# Patient Record
Sex: Female | Born: 1985
Health system: Southern US, Community
[De-identification: ages and names within clinical notes are randomized; demographics above are authoritative.]

## PROBLEM LIST (undated history)

## (undated) ENCOUNTER — Inpatient Hospital Stay (HOSPITAL_COMMUNITY): Payer: Self-pay

## (undated) ENCOUNTER — Ambulatory Visit (HOSPITAL_COMMUNITY): Admission: EM | Source: Home / Self Care

## (undated) DIAGNOSIS — I10 Essential (primary) hypertension: Secondary | ICD-10-CM

## (undated) DIAGNOSIS — O139 Gestational [pregnancy-induced] hypertension without significant proteinuria, unspecified trimester: Secondary | ICD-10-CM

## (undated) DIAGNOSIS — N760 Acute vaginitis: Secondary | ICD-10-CM

## (undated) DIAGNOSIS — N83209 Unspecified ovarian cyst, unspecified side: Secondary | ICD-10-CM

## (undated) DIAGNOSIS — B9689 Other specified bacterial agents as the cause of diseases classified elsewhere: Secondary | ICD-10-CM

## (undated) HISTORY — DX: Unspecified ovarian cyst, unspecified side: N83.209

---
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
## 2007-07-27 ENCOUNTER — Emergency Department (HOSPITAL_COMMUNITY): Admission: EM | Admit: 2007-07-27 | Discharge: 2007-07-27 | Payer: Self-pay | Admitting: Emergency Medicine

## 2007-07-28 ENCOUNTER — Inpatient Hospital Stay (HOSPITAL_COMMUNITY): Admission: AD | Admit: 2007-07-28 | Discharge: 2007-07-28 | Payer: Self-pay | Admitting: Obstetrics & Gynecology

## 2007-08-19 ENCOUNTER — Ambulatory Visit (HOSPITAL_COMMUNITY): Admission: RE | Admit: 2007-08-19 | Discharge: 2007-08-19 | Payer: Self-pay | Admitting: Family Medicine

## 2007-08-21 ENCOUNTER — Ambulatory Visit (HOSPITAL_COMMUNITY): Admission: RE | Admit: 2007-08-21 | Discharge: 2007-08-21 | Payer: Self-pay | Admitting: Obstetrics & Gynecology

## 2007-09-14 ENCOUNTER — Ambulatory Visit: Payer: Self-pay | Admitting: Obstetrics and Gynecology

## 2007-09-14 ENCOUNTER — Inpatient Hospital Stay (HOSPITAL_COMMUNITY): Admission: AD | Admit: 2007-09-14 | Discharge: 2007-09-16 | Payer: Self-pay | Admitting: Obstetrics & Gynecology

## 2007-09-21 ENCOUNTER — Ambulatory Visit (HOSPITAL_COMMUNITY): Admission: RE | Admit: 2007-09-21 | Discharge: 2007-09-21 | Payer: Self-pay | Admitting: Obstetrics & Gynecology

## 2007-09-28 ENCOUNTER — Ambulatory Visit (HOSPITAL_COMMUNITY): Admission: RE | Admit: 2007-09-28 | Discharge: 2007-09-28 | Payer: Self-pay | Admitting: Obstetrics & Gynecology

## 2007-10-01 ENCOUNTER — Ambulatory Visit: Payer: Self-pay | Admitting: Family Medicine

## 2007-10-05 ENCOUNTER — Ambulatory Visit (HOSPITAL_COMMUNITY): Admission: RE | Admit: 2007-10-05 | Discharge: 2007-10-05 | Payer: Self-pay | Admitting: Obstetrics & Gynecology

## 2007-10-08 ENCOUNTER — Ambulatory Visit: Payer: Self-pay | Admitting: *Deleted

## 2007-10-08 ENCOUNTER — Other Ambulatory Visit: Payer: Self-pay | Admitting: Obstetrics & Gynecology

## 2007-10-13 ENCOUNTER — Ambulatory Visit (HOSPITAL_COMMUNITY): Admission: RE | Admit: 2007-10-13 | Discharge: 2007-10-13 | Payer: Self-pay | Admitting: Obstetrics & Gynecology

## 2007-11-16 ENCOUNTER — Ambulatory Visit (HOSPITAL_COMMUNITY): Admission: RE | Admit: 2007-11-16 | Discharge: 2007-11-16 | Payer: Self-pay | Admitting: Obstetrics & Gynecology

## 2007-11-23 ENCOUNTER — Ambulatory Visit (HOSPITAL_COMMUNITY): Admission: RE | Admit: 2007-11-23 | Discharge: 2007-11-23 | Payer: Self-pay | Admitting: Obstetrics & Gynecology

## 2007-11-30 ENCOUNTER — Ambulatory Visit (HOSPITAL_COMMUNITY): Admission: RE | Admit: 2007-11-30 | Discharge: 2007-11-30 | Payer: Self-pay | Admitting: Obstetrics & Gynecology

## 2007-12-08 ENCOUNTER — Encounter: Payer: Self-pay | Admitting: *Deleted

## 2007-12-08 ENCOUNTER — Inpatient Hospital Stay (HOSPITAL_COMMUNITY): Admission: AD | Admit: 2007-12-08 | Discharge: 2007-12-08 | Payer: Self-pay | Admitting: Obstetrics and Gynecology

## 2007-12-10 ENCOUNTER — Ambulatory Visit: Payer: Self-pay | Admitting: Obstetrics & Gynecology

## 2007-12-10 ENCOUNTER — Inpatient Hospital Stay (HOSPITAL_COMMUNITY): Admission: AD | Admit: 2007-12-10 | Discharge: 2007-12-10 | Payer: Self-pay | Admitting: Obstetrics & Gynecology

## 2009-02-20 IMAGING — US US OB TRANSVAGINAL
1 series · 14 of 28 positions shown · non-contrast
Comparison: none

OBSTETRICAL ULTRASOUND:
 This ultrasound was performed in The [HOSPITAL], and the [facility] report will be stored to [REDACTED] PACS.

[Series 1: us ob transvaginal · 14 of 45 slices shown]
[im 2/45]
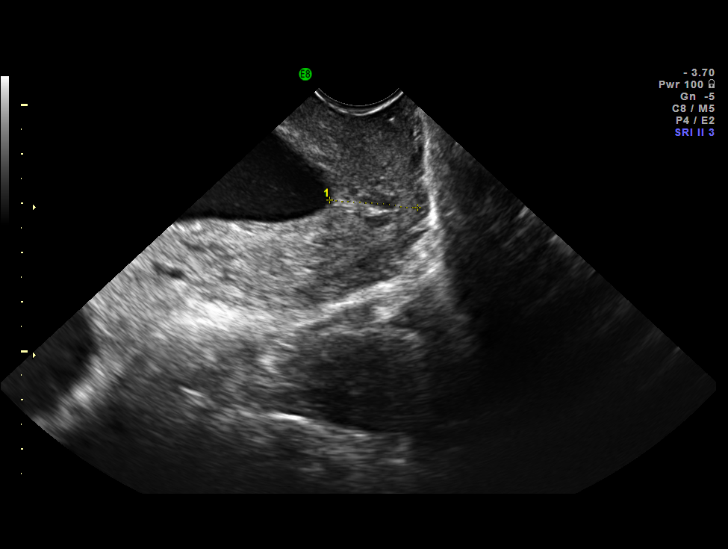
[im 5/45]
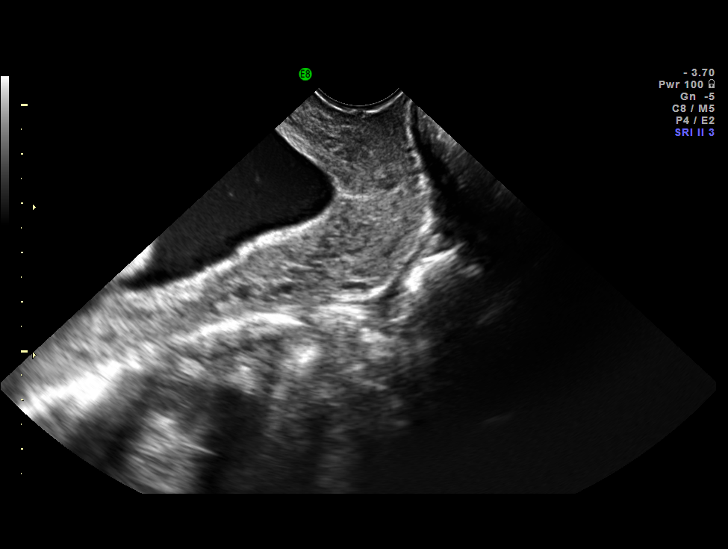
[im 9/45]
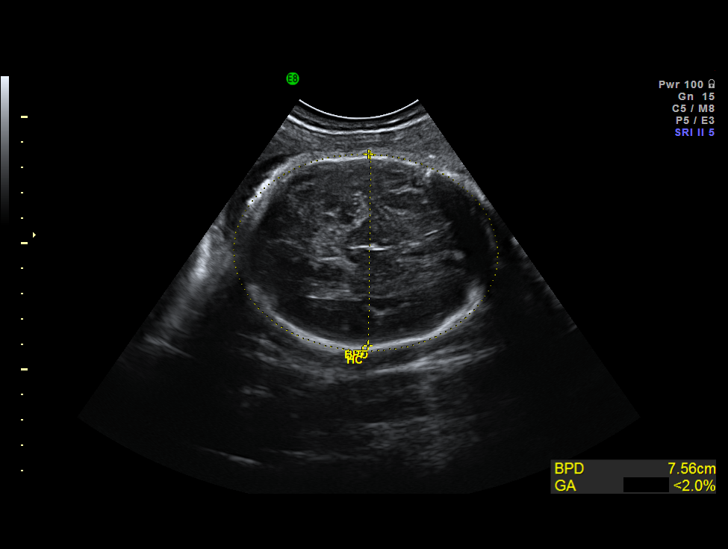
[im 12/45]
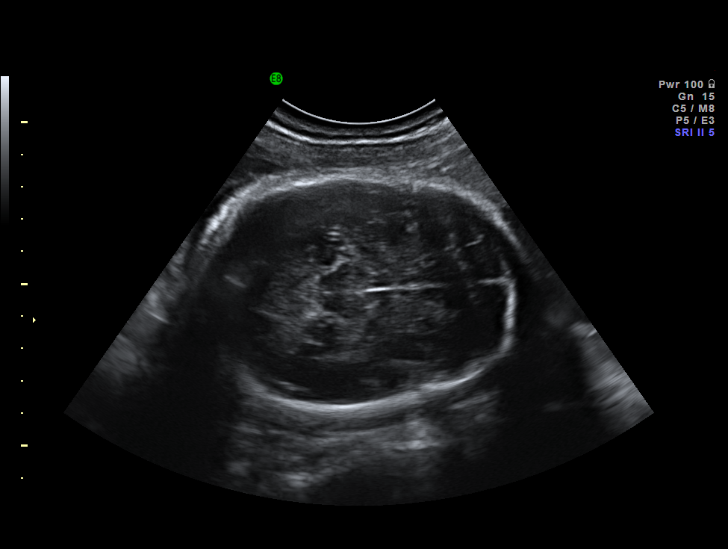
[im 15/45]
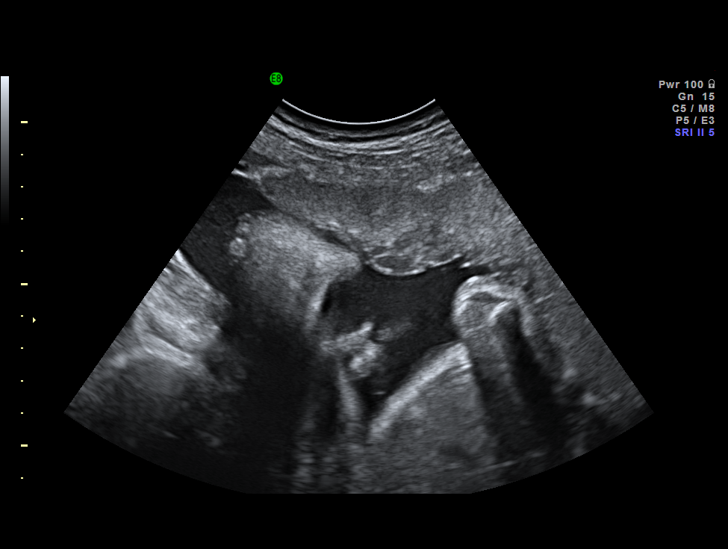
[im 18/45]
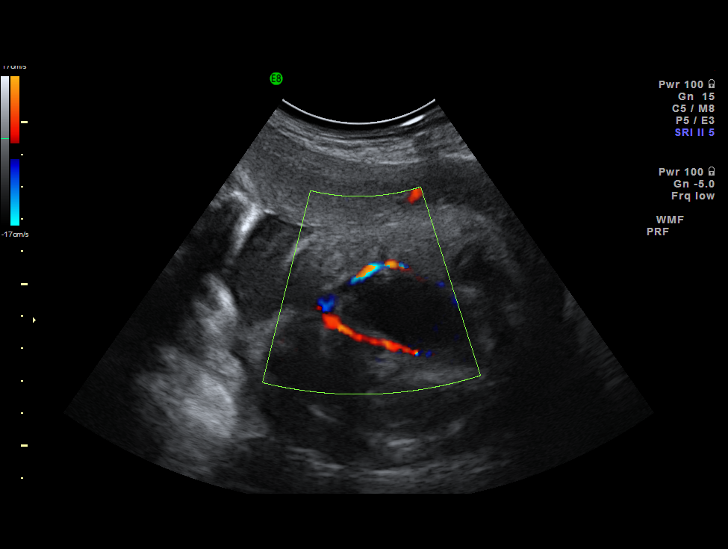
[im 22/45]
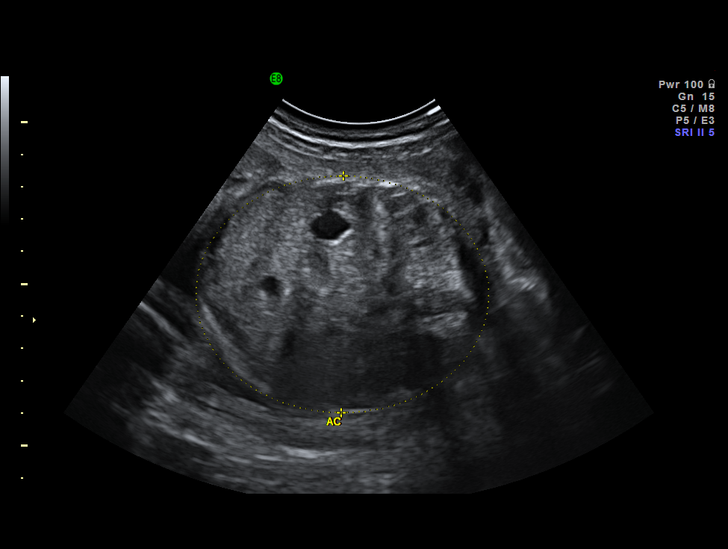
[im 25/45]
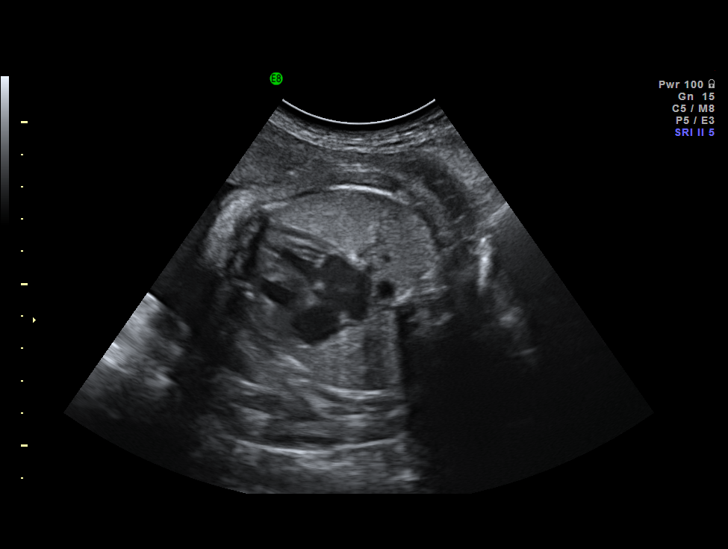
[im 28/45]
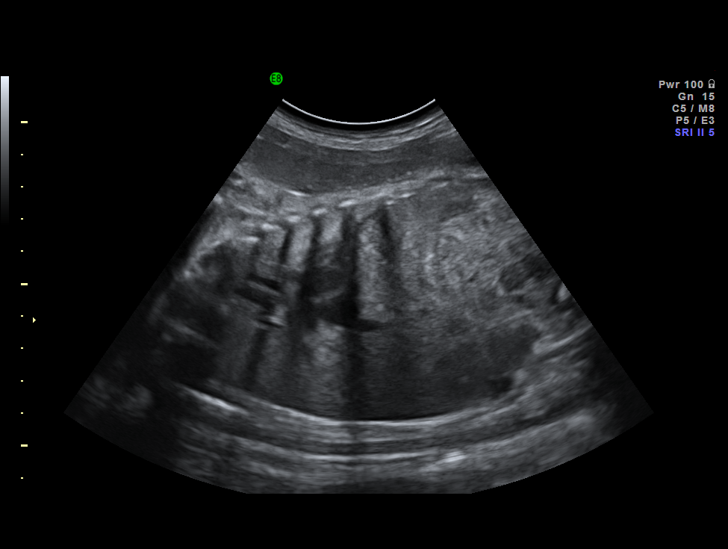
[im 31/45]
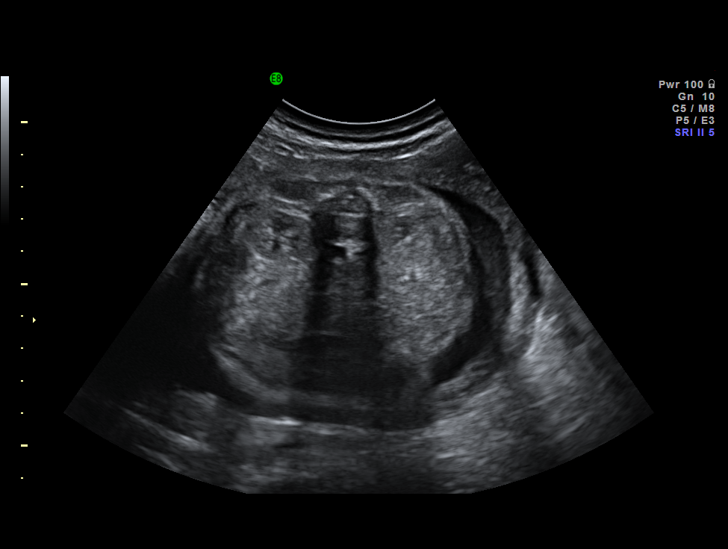
[im 35/45]
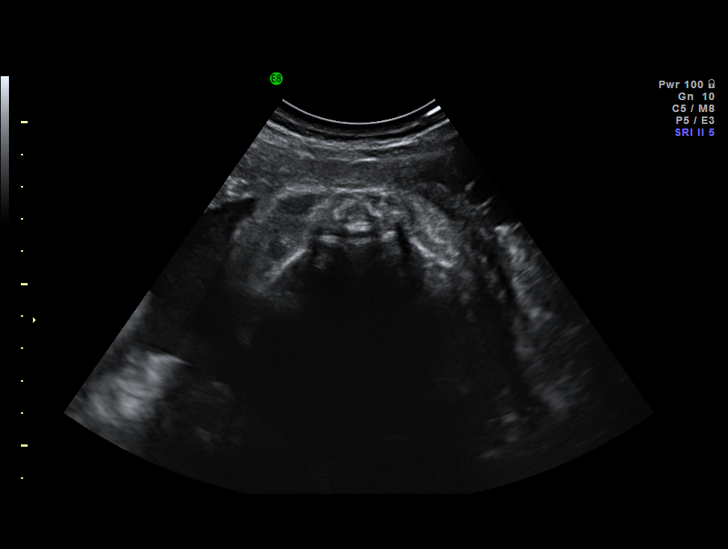
[im 38/45]
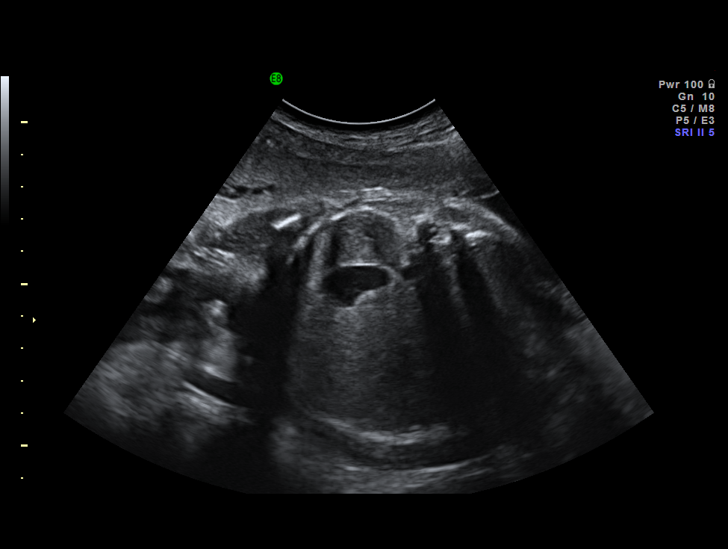
[im 41/45]
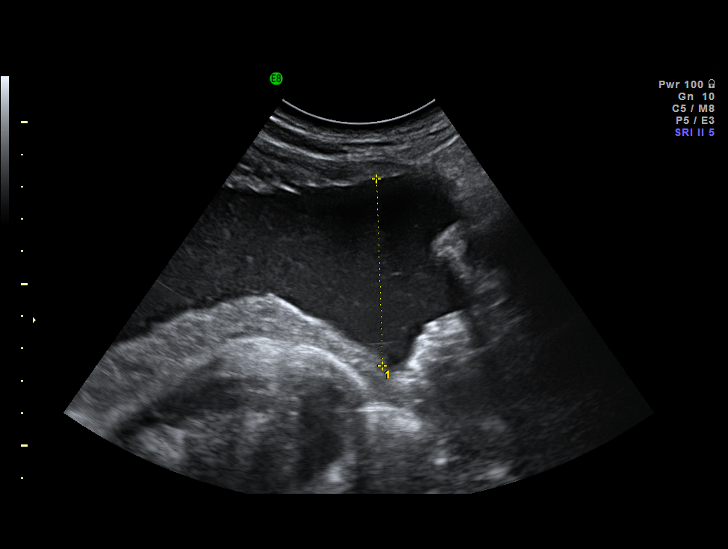
[im 45/45]
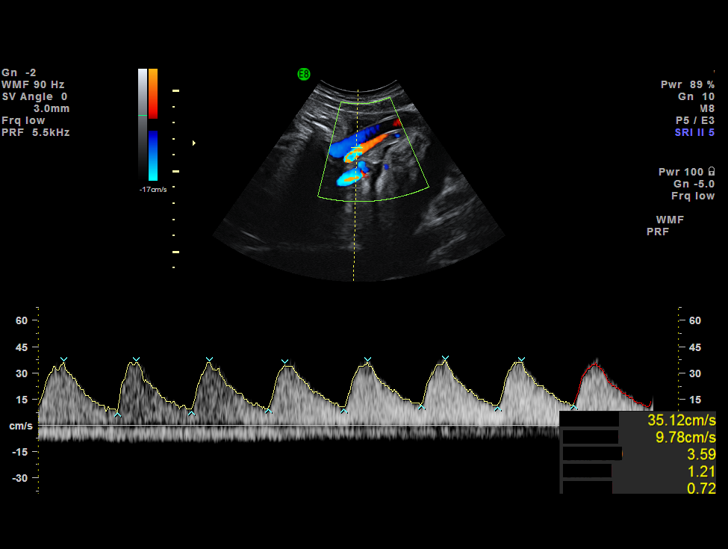

[14 of 28 positions shown; findings below may reference images not displayed]

IMPRESSION: The [facility] report has also been faxed to the ordering physician.

## 2009-03-06 IMAGING — US US OB FOLLOW-UP
1 series · 14 of 28 positions shown · non-contrast
Comparison: none

OBSTETRICAL ULTRASOUND:
 This ultrasound was performed in The [facility]OSP[facility]], and the [facility] report will be stored to [REDACTED] PACS.

[Series 1: us ob follow-up · 14 of 30 slices shown]
[im 2/30]
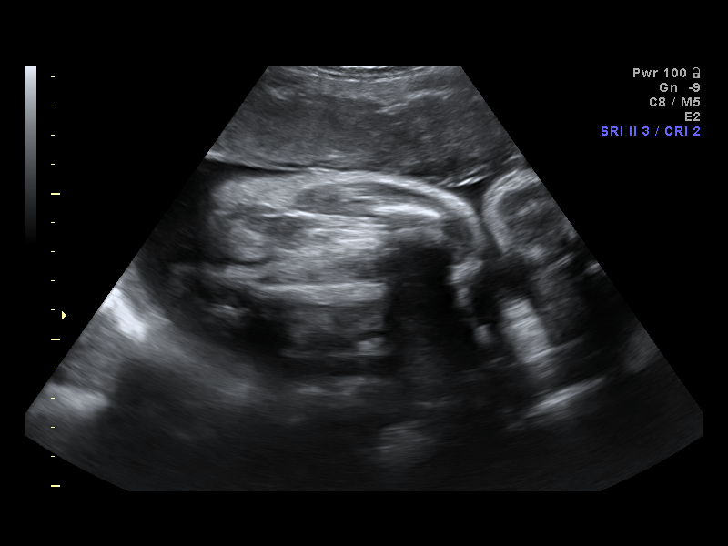
[im 4/30]
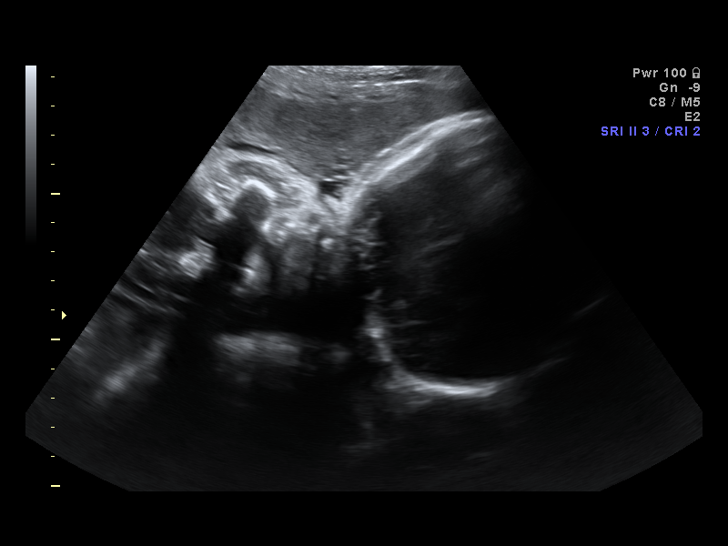
[im 6/30]
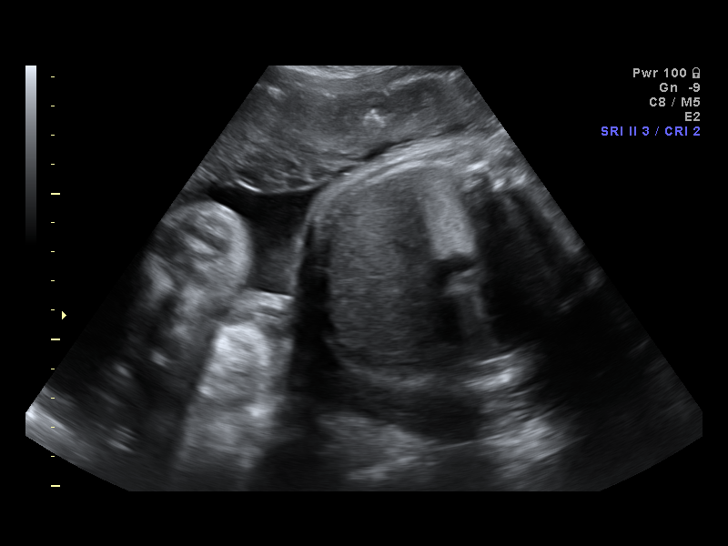
[im 8/30]
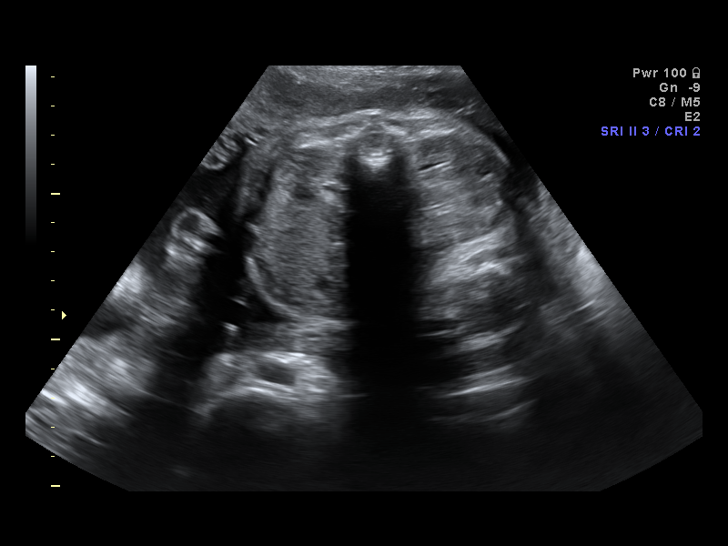
[im 10/30]
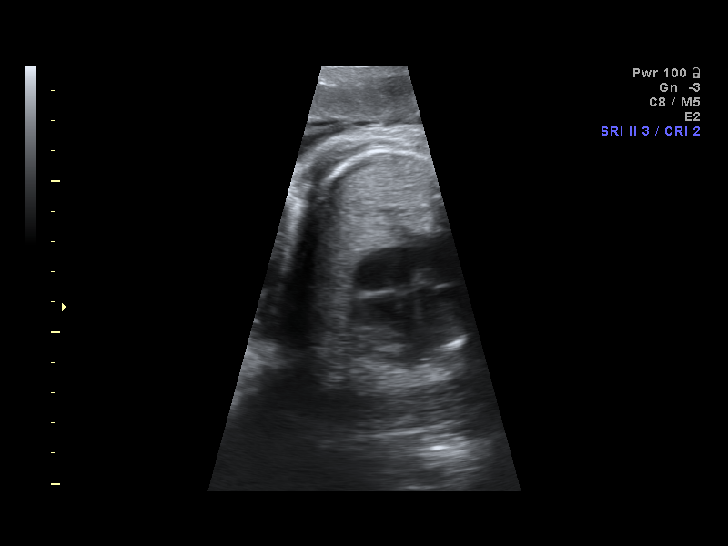
[im 12/30]
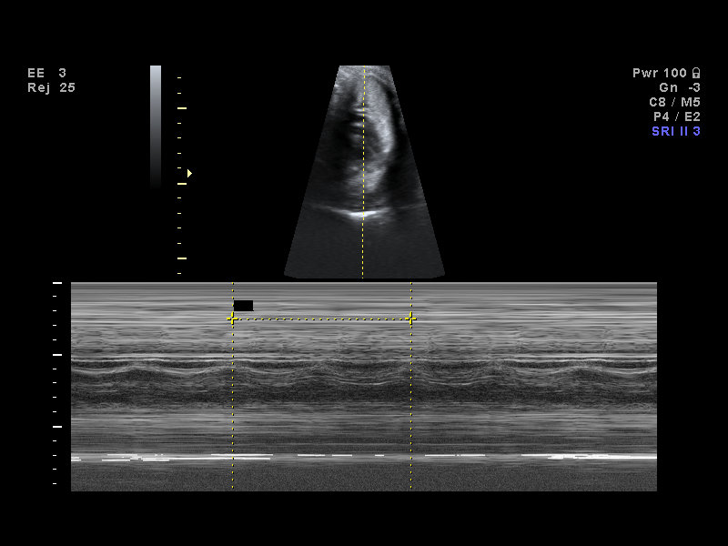
[im 14/30]
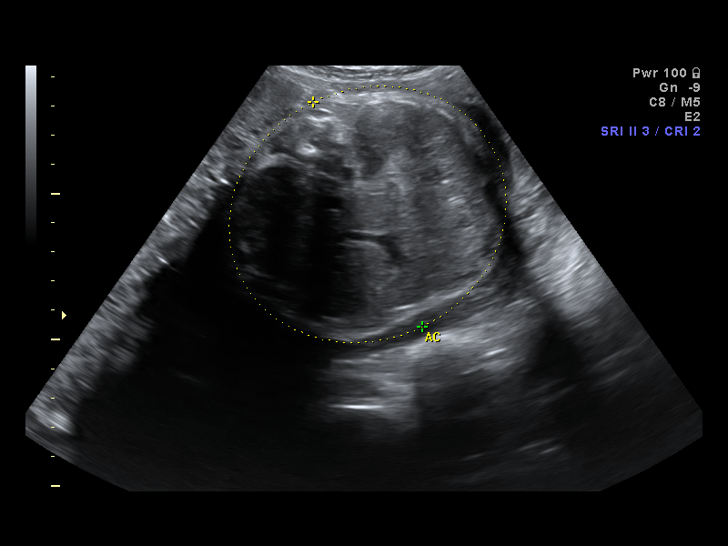
[im 17/30]
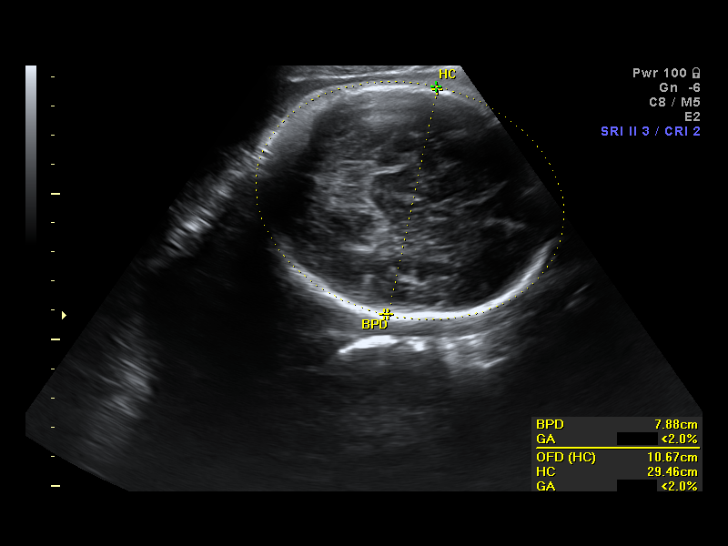
[im 19/30]
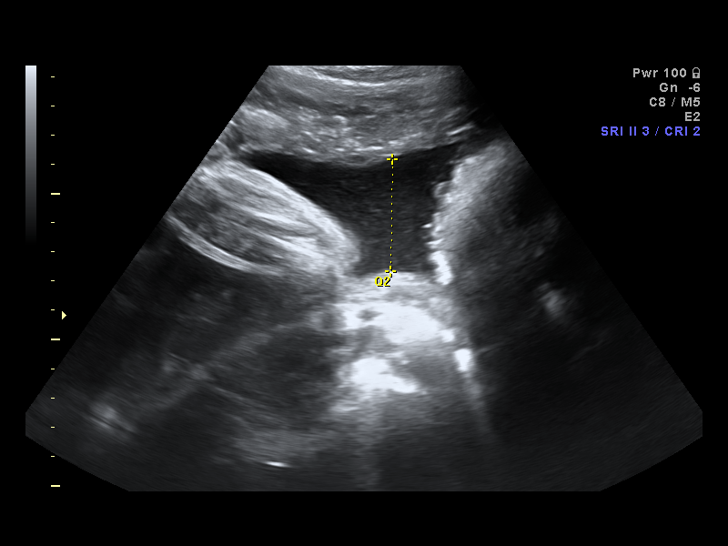
[im 21/30]
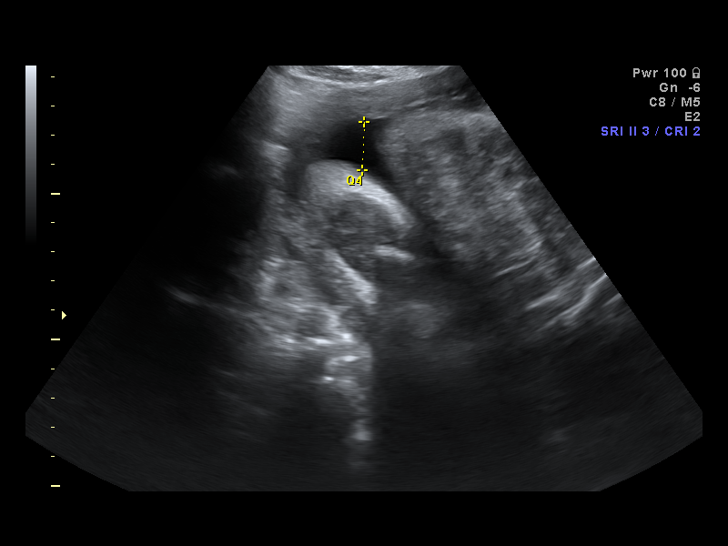
[im 23/30]
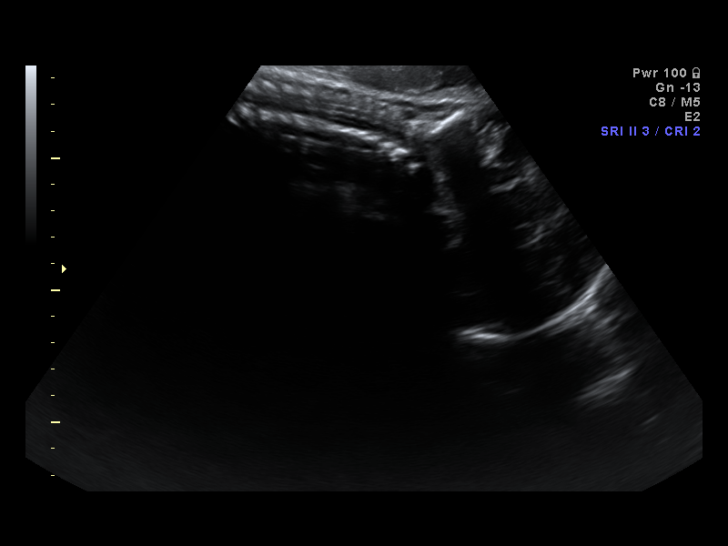
[im 25/30]
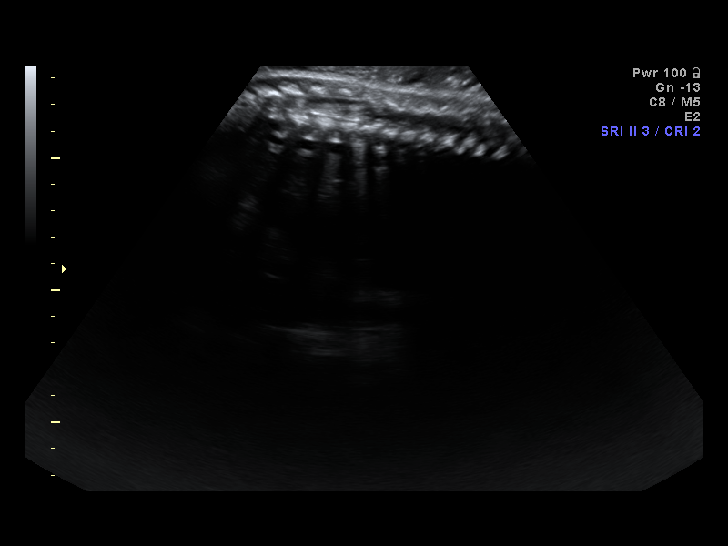
[im 27/30]
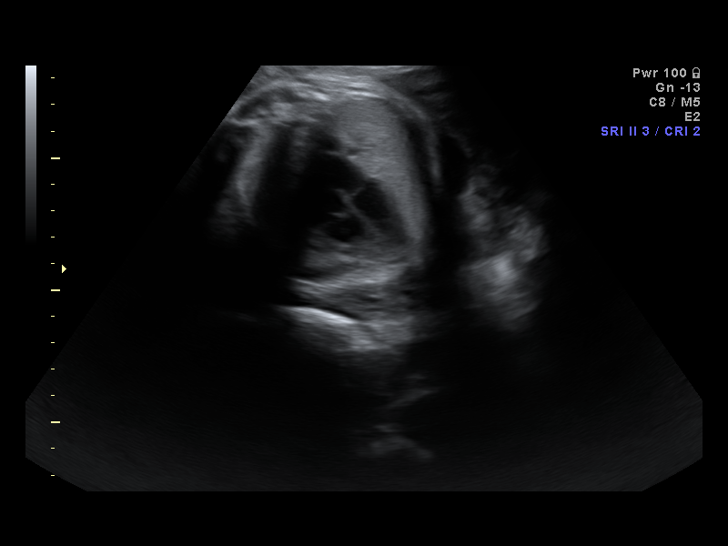
[im 30/30]
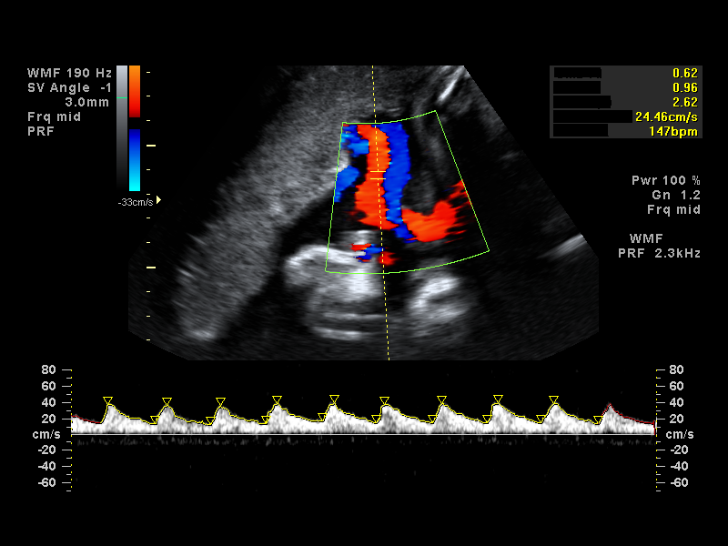

[14 of 28 positions shown; findings below may reference images not displayed]

IMPRESSION: The [facility] report has also been faxed to the ordering physician.

## 2010-12-30 ENCOUNTER — Encounter: Payer: Self-pay | Admitting: *Deleted

## 2011-04-23 NOTE — Discharge Summary (Signed)
Angela Manning, Angela Manning               ACCOUNT NO.:  [id number]   MEDICAL RECORD NO.:  [id number]          PATIENT TYPE:  INP   LOCATION:  9159                          FACILITY:  [facility]   PHYSICIAN:  Norton Blizzard, MD    DATE OF BIRTH:  Jan 17, 1986   DATE OF ADMISSION:  09/14/2007  DATE OF DISCHARGE:  09/16/2007                               DISCHARGE SUMMARY   ADMISSION DIAGNOSES:  1. Intrauterine pregnancy at 24 weeks 1 day's gestation.  2. Shortened cervix.  3. Group B streptococcus-positive urine culture.  4. Fetus with cleft lip and palate noted on ultrasound.   DISCHARGE DIAGNOSES:  1. Intrauterine pregnancy at 24 weeks 3 days' gestation.  2. Shortened cervix.  3. Group B streptococcus-positive urine culture.  4  fetal cleft lip and cleft palate.   BRIEF ADMISSION HISTORY:  This is a 25 year old gravida 2, para 1-0-1-1,  presenting to the [facility] after being seen for an ultrasound at maternal-  fetal medicine on September 14, 2007.  The patient was having an ultrasound  to follow a cleft lip and cleft palate.  She was found to have a  cervical length of 1 cm with funneling.  She was assessed to have a  short cervix and admitted for management.   PROCEDURES:  The patient had an OB ultrasound performed on September 14, 2007, due to a previous ultrasound showing cleft lip and cleft palate.  This ultrasound assessed the baby at a gestational age of [redacted] weeks 1  day.  It was also noted that she had a cervical length of 1 cm with U-  shaped funneling present.   PERTINENT LABORATORY:  The patient had admission laboratories with a  rapid HIV that was nonreactive.  Type and screen showing A positive,  antibody negative.  An obstetric panel showing hemoglobin 11.5, white  blood cell count 10.4, platelets 245.  RPR nonreactive.  Hepatitis B  surface antigen negative.  Rubella immune.  She had a fetal fibronectin  on hospital day #2 that was negative 12 hours after transvaginal  ultrasound.  She  had a wet prep performed on hospital day #3 showing  yeast, no Trichomonas, no clue cells.   CONSULTATIONS:  Neonatology.   COMPLICATIONS:  None.   HOSPITAL COURSE:  This patient was admitted and started on dexamethasone  6 mg IM every 4 hours x4 doses.  She was monitored initially and then  with external tocometer every 8 hours x1 hours and monitoring the heart  tones with the Doppler.  On hospital day #2 a fetal fibronectin was  collected 12 hours after vaginal ultrasound and came back negative.  The  patient was denying feeling any contraction throughout her hospital  stay.  Tocometer showed no evidence of contractions.  Throughout her  stay, though, she did have some uterine irritability on hospital day #1.  The baby's tones were monitored by Doppler in the 140s to 150s  throughout her stay.  Neonatology did see the patient and advised her  about the outlook of delivery at this gestational age, describing the  likely problems and  outcomes.  In review of her prenatal records, she  did have a GBS-positive urine culture on August 19, 2007.  She states  she was started on an antibiotic but did not finish the course of  amoxicillin.  The patient was reevaluated on hospital day #3, at which  time GC, chlamydia, GBS and wet prep were performed.  At that time  cervical exam was found to be at least 1 cm dilation, cervical length of  1.5 cm, and -3 station.  The head was vertex and ballottable.  On  hospital day #3 the patient was started on 17-hydroxyprogesterone 250 mg  IM, to repeat it weekly on an outpatient basis.  The patient was without  contractions, in stable condition and ready for discharge.  The patient  was to be discharged on bed rest.  This was discussed with her at length  and she understood what this meant.  The patient was to be set up with a  follow-up appointment on Monday but due to a child support hearing for  her first child, she is going to be in court all day on  Monday morning.  She does have an ultrasound scheduled for Monday afternoon, October 13,  at 2 p.m., and she states she will be able to make this appointment to  follow up a cervical length check.  She has agreed to follow up in the  high-risk clinic on October 16 at 8 a.m.   DISCHARGE STATUS:  Stable.   DISCHARGE MEDICATIONS:  1. Prenatal vitamins one tablet p.o. daily.  2. Keflex 500 mg p.o. b.i.d. x7 days for GBS-positive urine culture.  3. 17-hydroxyprogesterone 250 mg IM every week, to be given on an      outpatient basis.   DISCHARGE INSTRUCTIONS:  1. Discharged to home.  2. Strict bed rest.  This has been discussed at length with the      patient.  3. No sexual activity or anything in the vagina.  4. Regular diet.  5. The patient is to follow up on September 21, 2007, at 2 p.m., for her      repeat ultrasound in maternal-fetal medicine.  6. The patient is to follow up at high-risk clinic here at the [facility] on September 24, 2007, at 8 a.m. in the morning.      Karlton Lemon, MD      Norton Blizzard, MD  Electronically Signed    NS/MEDQ  D:  09/16/2007  T:  09/16/2007  Job:  [id number]

## 2011-08-29 LAB — GC/CHLAMYDIA PROBE AMP, GENITAL
Chlamydia, DNA Probe: NEGATIVE
GC Probe Amp, Genital: NEGATIVE

## 2011-08-29 LAB — WET PREP, GENITAL
Trich, Wet Prep: NONE SEEN
Yeast Wet Prep HPF POC: NONE SEEN

## 2011-09-18 LAB — POCT URINALYSIS DIP (DEVICE)
Bilirubin Urine: NEGATIVE
Bilirubin Urine: NEGATIVE
Glucose, UA: NEGATIVE
Glucose, UA: NEGATIVE
Hgb urine dipstick: NEGATIVE
Hgb urine dipstick: NEGATIVE
Ketones, ur: NEGATIVE
Ketones, ur: NEGATIVE
Nitrite: NEGATIVE
Nitrite: NEGATIVE
Operator id: 135281
Operator id: 297281
Protein, ur: NEGATIVE
Protein, ur: NEGATIVE
Specific Gravity, Urine: 1.02
Specific Gravity, Urine: 1.025
Urobilinogen, UA: 0.2
Urobilinogen, UA: 1
pH: 6
pH: 6.5

## 2011-09-19 LAB — CBC
HCT: 34.4 — ABNORMAL LOW
Hemoglobin: 11.5 — ABNORMAL LOW
MCHC: 33.5
MCV: 82.1
Platelets: 245
RBC: 4.19
RDW: 14.2 — ABNORMAL HIGH
WBC: 10.4

## 2011-09-19 LAB — DIFFERENTIAL
Basophils Absolute: 0
Basophils Relative: 0
Eosinophils Absolute: 0
Eosinophils Relative: 0
Lymphocytes Relative: 5 — ABNORMAL LOW
Lymphs Abs: 0.5 — ABNORMAL LOW
Monocytes Absolute: 0.1 — ABNORMAL LOW
Monocytes Relative: 1 — ABNORMAL LOW
Neutro Abs: 9.7 — ABNORMAL HIGH
Neutrophils Relative %: 93 — ABNORMAL HIGH

## 2011-09-19 LAB — URINE CULTURE
Colony Count: 7000
Special Requests: NEGATIVE

## 2011-09-19 LAB — STREP B DNA PROBE: Strep Group B Ag: POSITIVE

## 2011-09-19 LAB — TYPE AND SCREEN
ABO/RH(D): A POS
Antibody Screen: NEGATIVE

## 2011-09-19 LAB — URINALYSIS, ROUTINE W REFLEX MICROSCOPIC
Bilirubin Urine: NEGATIVE
Glucose, UA: NEGATIVE
Hgb urine dipstick: NEGATIVE
Ketones, ur: NEGATIVE
Nitrite: NEGATIVE
Protein, ur: NEGATIVE
Specific Gravity, Urine: 1.015
Urobilinogen, UA: 0.2
pH: 7.5

## 2011-09-19 LAB — GC/CHLAMYDIA PROBE AMP, GENITAL
Chlamydia, DNA Probe: NEGATIVE
GC Probe Amp, Genital: NEGATIVE

## 2011-09-19 LAB — RPR: RPR Ser Ql: NONREACTIVE

## 2011-09-19 LAB — HEPATITIS B SURFACE ANTIGEN: Hepatitis B Surface Ag: NEGATIVE

## 2011-09-19 LAB — WET PREP, GENITAL
Clue Cells Wet Prep HPF POC: NONE SEEN
Trich, Wet Prep: NONE SEEN
Yeast Wet Prep HPF POC: NONE SEEN

## 2011-09-19 LAB — RUBELLA SCREEN: Rubella: 18.1 — ABNORMAL HIGH

## 2011-09-19 LAB — FETAL FIBRONECTIN: Fetal Fibronectin: NEGATIVE

## 2011-09-19 LAB — ABO/RH: ABO/RH(D): A POS

## 2011-09-19 LAB — RAPID HIV SCREEN (WH-MAU): Rapid HIV Screen: NONREACTIVE

## 2011-09-20 LAB — URINALYSIS, ROUTINE W REFLEX MICROSCOPIC
Bilirubin Urine: NEGATIVE
Bilirubin Urine: NEGATIVE
Glucose, UA: NEGATIVE
Glucose, UA: NEGATIVE
Hgb urine dipstick: NEGATIVE
Hgb urine dipstick: NEGATIVE
Ketones, ur: NEGATIVE
Ketones, ur: NEGATIVE
Nitrite: NEGATIVE
Nitrite: NEGATIVE
Protein, ur: NEGATIVE
Protein, ur: NEGATIVE
Specific Gravity, Urine: 1.016
Specific Gravity, Urine: <1.005 — ABNORMAL LOW
Urobilinogen, UA: 0.2
Urobilinogen, UA: 1
pH: 6.5
pH: 6.5

## 2011-09-20 LAB — URINE MICROSCOPIC-ADD ON

## 2011-09-20 LAB — WET PREP, GENITAL
Clue Cells Wet Prep HPF POC: NONE SEEN
Yeast Wet Prep HPF POC: NONE SEEN

## 2011-09-20 LAB — PREGNANCY, URINE: Preg Test, Ur: POSITIVE

## 2011-09-20 LAB — GC/CHLAMYDIA PROBE AMP, GENITAL
Chlamydia, DNA Probe: NEGATIVE
GC Probe Amp, Genital: NEGATIVE

## 2011-09-20 LAB — POCT PREGNANCY, URINE
Operator id: 120561
Preg Test, Ur: POSITIVE

## 2012-02-09 ENCOUNTER — Encounter (HOSPITAL_COMMUNITY): Payer: Self-pay | Admitting: *Deleted

## 2012-02-09 ENCOUNTER — Emergency Department (HOSPITAL_COMMUNITY)
Admission: EM | Admit: 2012-02-09 | Discharge: 2012-02-09 | Disposition: A | Discharge status: Home / Self Care | Payer: Medicaid Other | Attending: Emergency Medicine | Admitting: Emergency Medicine

## 2012-02-09 DIAGNOSIS — K089 Disorder of teeth and supporting structures, unspecified: Secondary | ICD-10-CM | POA: Insufficient documentation

## 2012-02-09 DIAGNOSIS — K0889 Other specified disorders of teeth and supporting structures: Secondary | ICD-10-CM

## 2012-02-09 DIAGNOSIS — K029 Dental caries, unspecified: Secondary | ICD-10-CM | POA: Insufficient documentation

## 2012-02-09 MED ORDER — HYDROCODONE-ACETAMINOPHEN 5-325 MG PO TABS: ORAL_TABLET | ORAL | Status: AC

## 2012-02-09 MED ORDER — NAPROXEN 250 MG PO TABS: 250.0000 mg | ORAL_TABLET | Freq: Two times a day (BID) | ORAL | Status: DC

## 2012-02-09 MED ORDER — PENICILLIN V POTASSIUM 250 MG PO TABS: 250.0000 mg | ORAL_TABLET | Freq: Four times a day (QID) | ORAL | Status: AC

## 2012-02-09 NOTE — ED Provider Notes (Signed)
History     CSN: [id number]  Arrival date & time 02/09/12  2020   First MD Initiated Contact with Patient 02/09/12 2147      Chief Complaint  Patient presents with  . Dental Pain     HPI Pt was seen at 2145.  Per pt, c/o gradual onset and persistence of constant left lower tooth "pain" for the past several days.  Denies fevers, no intra-oral edema, no rash, no dysphagia, no neck pain, no injury.   The condition is aggravated by nothing. The condition is relieved by nothing. The patient has no significant history of serious medical conditions.    History reviewed. No pertinent past medical history.  History reviewed. No pertinent past surgical history.  History  Substance Use Topics  . Smoking status: Not on file  . Smokeless tobacco: Not on file  . Alcohol Use: Not on file    Review of Systems ROS: Statement: All systems negative except as marked or noted in the HPI; Constitutional: Negative for fever and chills. ; ; Eyes: Negative for eye pain and discharge. ; ; ENMT: Positive for dental caries, dental hygiene poor and toothache. Negative for ear pain, bleeding gums, dental injury, facial deformity, hoarseness, nasal congestion, sinus pressure, sore throat, throat swelling and tongue swollen. ; ; Cardiovascular: Negative for chest pain, palpitations, diaphoresis, dyspnea and peripheral edema. ; ; Respiratory: Negative for cough, wheezing and stridor. ; ; Gastrointestinal: Negative for nausea, vomiting, diarrhea and abdominal pain. ; ; Genitourinary: Negative for dysuria, flank pain and hematuria. ; ; Musculoskeletal: Negative for back pain and neck pain. ; ; Skin: Negative for rash and skin lesion. ; ; Neuro: Negative for headache, lightheadedness and neck stiffness. ;    Allergies  Review of patient's allergies indicates no known allergies.  Home Medications  No current outpatient prescriptions on file.  BP 141/94  Pulse 77  Temp(Src) 98 F (36.7 C) (Oral)  Resp 20  SpO2  100%  Physical Exam 2150: Physical examination: Vital signs and O2 SAT: Reviewed; Constitutional: Well developed, Well nourished, Well hydrated, In no acute distress; Head and Face: Normocephalic, Atraumatic; Eyes: EOMI, PERRL, No scleral icterus; ENMT: Mouth and pharynx normal, Poor dentition, Widespread dental decay, Left TM normal, Right TM normal, Mucous membranes moist, +lower left 2nd premolar and 1st molar with extensive dental decay.  No gingival erythema, edema, fluctuance, or drainage.  No hoarse voice, no drooling, no stridor.  ; Neck: Supple, Full range of motion, No lymphadenopathy; Cardiovascular: Regular rate and rhythm, No murmur, rub, or gallop; Respiratory: Breath sounds clear & equal bilaterally, No rales, rhonchi, wheezes, or rub, Normal respiratory effort/excursion; Chest: Nontender, Movement normal; Extremities: Pulses normal, No tenderness, No edema; Neuro: AA&Ox3, Major CN grossly intact.  No gross focal motor or sensory deficits in extremities.; Skin: Color normal, No rash, No petechiae, Warm, Dry   ED Course  Procedures    MDM  MDM Reviewed: nursing note and vitals            Laray Anger, DO 02/11/12 2338

## 2012-02-09 NOTE — ED Notes (Signed)
Pt in c/o toothache to left upper jaw, c/o pain to left side of face, swelling noted

## 2012-07-10 ENCOUNTER — Emergency Department (HOSPITAL_COMMUNITY): Payer: Medicaid Other

## 2012-07-10 ENCOUNTER — Encounter (HOSPITAL_COMMUNITY): Payer: Self-pay | Admitting: Emergency Medicine

## 2012-07-10 ENCOUNTER — Emergency Department (HOSPITAL_COMMUNITY)
Admission: EM | Admit: 2012-07-10 | Discharge: 2012-07-10 | Disposition: A | Discharge status: Home / Self Care | Payer: Medicaid Other | Attending: Emergency Medicine | Admitting: Emergency Medicine

## 2012-07-10 DIAGNOSIS — B9689 Other specified bacterial agents as the cause of diseases classified elsewhere: Secondary | ICD-10-CM | POA: Insufficient documentation

## 2012-07-10 DIAGNOSIS — Z3490 Encounter for supervision of normal pregnancy, unspecified, unspecified trimester: Secondary | ICD-10-CM

## 2012-07-10 DIAGNOSIS — A499 Bacterial infection, unspecified: Secondary | ICD-10-CM | POA: Insufficient documentation

## 2012-07-10 DIAGNOSIS — N760 Acute vaginitis: Secondary | ICD-10-CM | POA: Insufficient documentation

## 2012-07-10 DIAGNOSIS — O2390 Unspecified genitourinary tract infection in pregnancy, unspecified trimester: Secondary | ICD-10-CM | POA: Insufficient documentation

## 2012-07-10 LAB — URINALYSIS, ROUTINE W REFLEX MICROSCOPIC
Bilirubin Urine: NEGATIVE
Glucose, UA: NEGATIVE mg/dL
Hgb urine dipstick: NEGATIVE
Ketones, ur: NEGATIVE mg/dL
Nitrite: NEGATIVE
Protein, ur: NEGATIVE mg/dL
Specific Gravity, Urine: 1.005 (ref 1.005–1.030)
Urobilinogen, UA: 0.2 mg/dL (ref 0.0–1.0)
pH: 7 (ref 5.0–8.0)

## 2012-07-10 LAB — WET PREP, GENITAL
Trich, Wet Prep: NONE SEEN
Yeast Wet Prep HPF POC: NONE SEEN

## 2012-07-10 LAB — URINE MICROSCOPIC-ADD ON

## 2012-07-10 LAB — POCT PREGNANCY, URINE: Preg Test, Ur: POSITIVE — AB

## 2012-07-10 LAB — HCG, QUANTITATIVE, PREGNANCY: hCG, Beta Chain, Quant, S: 105450 m[IU]/mL — ABNORMAL HIGH (ref <5)

## 2012-07-10 MED ORDER — ONDANSETRON 8 MG PO TBDP: 8.0000 mg | ORAL_TABLET | Freq: Three times a day (TID) | ORAL | Status: AC | PRN

## 2012-07-10 MED ORDER — METRONIDAZOLE 500 MG PO TABS: 500.0000 mg | ORAL_TABLET | Freq: Two times a day (BID) | ORAL | Status: AC

## 2012-07-10 NOTE — ED Provider Notes (Signed)
History  This chart was scribed for [employer]. Rubin Payor, MD by Erskine Emery. This patient was seen in room TR11C/TR11C and the patient's care was started at 15:29.   CSN: [id number]  Arrival date & time 07/10/12  1501   First MD Initiated Contact with Patient 07/10/12 1529      Chief Complaint  Patient presents with  . Urinary Tract Infection  . Abdominal Pain    (Consider location/radiation/quality/duration/timing/severity/associated sxs/prior treatment) HPI Angela Manning is a 26 y.o. female who presents to the Emergency Department complaining of abdominal pain, nausea, urinary frequency, dysuria, frequent excretion (but no runny stools), and pink vaginal discharge for 2 days. Pt denies any fevers or breast soreness. Pt reports she had a positive pregnancy test at home and that her LNMP was in June. Pt has 3 children and denies any previous episodes of similar symptoms.    History reviewed. No pertinent past medical history.  History reviewed. No pertinent past surgical history.  History reviewed. No pertinent family history.  History  Substance Use Topics  . Smoking status: Current Everyday Smoker  . Smokeless tobacco: Not on file  . Alcohol Use: Yes    OB History    Grav Para Term Preterm Abortions TAB SAB Ect Mult Living                  Review of Systems  Constitutional: Negative for fever and chills.  Respiratory: Negative for shortness of breath.   Gastrointestinal: Positive for nausea and abdominal pain. Negative for vomiting and diarrhea.  Genitourinary: Positive for dysuria, frequency, decreased urine volume and vaginal discharge (pink).  Neurological: Negative for weakness.    Allergies  Review of patient's allergies indicates no known allergies.  Home Medications   Current Outpatient Rx  Name Route Sig Dispense Refill  . IBUPROFEN 200 MG PO TABS Oral Take 200 mg by mouth every 4 (four) hours as needed. For tooth pain    . METRONIDAZOLE 500 MG PO  TABS Oral Take 1 tablet (500 mg total) by mouth 2 (two) times daily. 14 tablet 0    BP 142/102  Pulse 103  Temp 98.2 F (36.8 C) (Oral)  Resp 16  SpO2 100%  Physical Exam  Nursing note and vitals reviewed. Constitutional: She is oriented to person, place, and time. She appears well-developed and well-nourished. No distress.  HENT:  Head: Normocephalic and atraumatic.  Eyes: EOM are normal.  Neck: Neck supple. No tracheal deviation present.  Cardiovascular: Normal rate.   Pulmonary/Chest: Effort normal. No respiratory distress.  Abdominal: Soft. There is tenderness. There is no rebound and no guarding.       Mild suprapubic tenderness.   Musculoskeletal: Normal range of motion.  Neurological: She is alert and oriented to person, place, and time.  Skin: Skin is warm and dry.  Psychiatric: She has a normal mood and affect. Her behavior is normal.   pelvic exam showed some white pelvic discharge. Os is closed.  ED Course  Procedures (including critical care time) DIAGNOSTIC STUDIES: Oxygen Saturation is 100% on room air, normal by my interpretation.    COORDINATION OF CARE: 15:35--I evaluated the patient and we discussed a treatment plan including urinalysis, blood work, ultrasound, and pregnancy test to which the pt agreed.    Labs Reviewed  URINALYSIS, ROUTINE W REFLEX MICROSCOPIC - Abnormal; Notable for the following:    Leukocytes, UA SMALL (*)     All other components within normal limits  HCG, QUANTITATIVE, PREGNANCY -  Abnormal; Notable for the following:    hCG, Beta Chain, Quant, S [id number] (*)     All other components within normal limits  WET PREP, GENITAL - Abnormal; Notable for the following:    Clue Cells Wet Prep HPF POC MODERATE (*)     WBC, Wet Prep HPF POC MANY (*)     All other components within normal limits  POCT PREGNANCY, URINE - Abnormal; Notable for the following:    Preg Test, Ur POSITIVE (*)     All other components within normal limits  URINE  MICROSCOPIC-ADD ON - Abnormal; Notable for the following:    Squamous Epithelial / LPF FEW (*)     Bacteria, UA FEW (*)     All other components within normal limits  GC/CHLAMYDIA PROBE AMP, GENITAL   US Ob Comp Less 14 Wks  07/10/2012  *RADIOLOGY REPORT*  Clinical Data: Abdominal and pelvic pain.  OBSTETRIC <14 WK ULTRASOUND, TRANSVAGINAL OB US  Technique:  Transabdominal and transvaginal ultrasound was performed for evaluation of the gestation as well as the maternal uterus and adnexal regions.  Findings:  Number of gestation: 1 Heart Rate: 150 bpm  CRL:  11.2 mm         7w  2d               [place] EDC: 02/24/2013  Maternal uterus/adnexae: Small subchorionic hemorrhage measures 1 x 0.4 x 0.5 cm.  The right ovary appears normal.  Within the left ovary there is a focal area of relative hypo echogenicity measuring 1.7 x 1.4 x 1.1 cm.  Favored to represent a corpus luteum.  Negative for free fluid.  IMPRESSION:  1.  Single living intrauterine pregnancy with an estimated gestational age of [redacted] weeks and 2 days. 2.  Small subchorionic hemorrhage.  Original Report Authenticated By: Rosealee Albee, M.D.   US Ob Transvaginal  07/10/2012  *RADIOLOGY REPORT*  Clinical Data: Abdominal and pelvic pain.  OBSTETRIC <14 WK ULTRASOUND, TRANSVAGINAL OB US  Technique:  Transabdominal and transvaginal ultrasound was performed for evaluation of the gestation as well as the maternal uterus and adnexal regions.  Findings:  Number of gestation: 1 Heart Rate: 150 bpm  CRL:  11.2 mm         7w  2d               [place] EDC: 02/24/2013  Maternal uterus/adnexae: Small subchorionic hemorrhage measures 1 x 0.4 x 0.5 cm.  The right ovary appears normal.  Within the left ovary there is a focal area of relative hypo echogenicity measuring 1.7 x 1.4 x 1.1 cm.  Favored to represent a corpus luteum.  Negative for free fluid.  IMPRESSION:  1.  Single living intrauterine pregnancy with an estimated gestational age of [redacted] weeks and 2 days. 2.  Small  subchorionic hemorrhage.  Original Report Authenticated By: Rosealee Albee, M.D.     1. Pregnant   2. Bacterial vaginosis       MDM  Patient with lower abdominal pain. Nausea vomiting. She was found to be a little weeks pregnant with an intrauterine pregnancy. I doubt heterotopic pregnancy. Patient also has BP and will be treated. She'll be discharged to followup with gynecology.    I personally performed the services described in this documentation, which was scribed in my presence. The recorded information has been reviewed and considered.           Juliet Rude. Rubin Payor, MD 07/10/12 [phone]

## 2012-07-10 NOTE — ED Notes (Signed)
Pt c/o increased abd pain and UTI sx of frequency and urgency x 3 days

## 2012-07-13 LAB — GC/CHLAMYDIA PROBE AMP, GENITAL
Chlamydia, DNA Probe: POSITIVE — AB
GC Probe Amp, Genital: NEGATIVE

## 2012-07-14 NOTE — ED Notes (Addendum)
+  Chlamydia Chart sent to EDP office for review.  

## 2012-07-16 NOTE — ED Notes (Signed)
Patient informed of positive results after id'd x 2 and informed of need to notify partner to be treated.   Rx for Azithromycin 1 gram po x 1 called to [facility] reviewed by Johnnette Gourd [facility]

## 2012-07-16 NOTE — ED Notes (Signed)
Pres[facility] on [address] at [phone] for azithromycin gm 1 po x1 dose, no refills per robyn albert, pa-c.

## 2013-02-20 ENCOUNTER — Emergency Department (HOSPITAL_COMMUNITY): Payer: Medicaid Other

## 2013-02-20 ENCOUNTER — Encounter (HOSPITAL_COMMUNITY): Payer: Self-pay | Admitting: Emergency Medicine

## 2013-02-20 ENCOUNTER — Ambulatory Visit (HOSPITAL_COMMUNITY)
Admission: EM | Admit: 2013-02-20 | Discharge: 2013-02-21 | Disposition: A | Discharge status: Home / Self Care | Payer: Medicaid Other | Attending: Family Medicine | Admitting: Family Medicine

## 2013-02-20 LAB — POCT I-STAT, CHEM 8
BUN: 11 mg/dL (ref 6–23)
Calcium, Ion: 1.22 mmol/L (ref 1.12–1.23)
Chloride: 109 mEq/L (ref 96–112)
Creatinine, Ser: 0.5 mg/dL (ref 0.50–1.10)
Glucose, Bld: 121 mg/dL — ABNORMAL HIGH (ref 70–99)
HCT: 32 % — ABNORMAL LOW (ref 36.0–46.0)
Hemoglobin: 10.9 g/dL — ABNORMAL LOW (ref 12.0–15.0)
Potassium: 3.6 mEq/L (ref 3.5–5.1)
Sodium: 141 mEq/L (ref 135–145)
TCO2: 23 mmol/L (ref 0–100)

## 2013-02-20 NOTE — ED Provider Notes (Signed)
History     CSN: [id number]  Arrival date & time 02/20/13  7829   First MD Initiated Contact with Patient 02/20/13 2005      Chief Complaint  Patient presents with  . Vaginal Bleeding    (Consider location/radiation/quality/duration/timing/severity/associated sxs/prior treatment) HPI History provided by pt.   Pt is 3 weeks PP.  Had an uncomplicated vaginal birth at [facility].  Vaginal bleeding stopped 1 week ago but started again today and has been very heavy.  Passing large clots and changing pad every 5-10 minutes.  No associated fever, abdominal pain, N/V/D or other GU sx.  Had a single episode of lightheadedness that resolved when she layed down.  No PMH and is not anti-coagulated.   History reviewed. No pertinent past medical history.  History reviewed. No pertinent past surgical history.  History reviewed. No pertinent family history.  History  Substance Use Topics  . Smoking status: Current Every Day Smoker -- 0.25 packs/day    Types: Cigarettes  . Smokeless tobacco: Never Used  . Alcohol Use: No    OB History   Grav Para Term Preterm Abortions TAB SAB Ect Mult Living                  Review of Systems  All other systems reviewed and are negative.    Allergies  Review of patient's allergies indicates no known allergies.  Home Medications  No current outpatient prescriptions on file.  BP 120/76  Pulse 102  Temp(Src) 99.1 F (37.3 C) (Oral)  Resp 16  Ht 5\' 7"  (1.702 m)  Wt 148 lb (67.132 kg)  BMI 23.17 kg/m2  SpO2 96%  Physical Exam  Nursing note and vitals reviewed. Constitutional: She is oriented to person, place, and time. She appears well-developed and well-nourished. No distress.  HENT:  Head: Normocephalic and atraumatic.  Eyes:  Normal appearance  Neck: Normal range of motion.  Cardiovascular: Normal rate and regular rhythm.   Pulmonary/Chest: Effort normal and breath sounds normal. No respiratory distress.  Abdominal: Soft. Bowel sounds  are normal. She exhibits no distension and no mass. There is no tenderness. There is no rebound and no guarding.  Genitourinary:  Nml external genitalia. Internal exam limited d/t heavy vaginal bleeding.  Unable to visualize the cervix but it feels closed and is mildly ttp.  There is L adnexal tenderness as well.     Musculoskeletal: Normal range of motion.  Neurological: She is alert and oriented to person, place, and time.  Skin: Skin is warm and dry. No rash noted.  Psychiatric: She has a normal mood and affect. Her behavior is normal.    ED Course  Procedures (including critical care time)  Labs Reviewed  POCT I-STAT, CHEM 8 - Abnormal; Notable for the following:    Glucose, Bld 121 (*)    Hemoglobin 10.9 (*)    HCT 32.0 (*)    All other components within normal limits   US Transvaginal Non-ob  02/20/2013  *RADIOLOGY REPORT*  Clinical Data: 3 weeks postpartum.  Heavy bleeding with clots which started today.  TRANSABDOMINAL AND TRANSVAGINAL ULTRASOUND OF PELVIS Technique:  Both transabdominal and transvaginal ultrasound examinations of the pelvis were performed. Transabdominal technique was performed for global imaging of the pelvis including uterus, ovaries, adnexal regions, and pelvic cul-de-sac.  It was necessary to proceed with endovaginal exam following the transabdominal exam to visualize the uterus, endometrium, ovaries, adnexal regions.  Comparison:  None  Findings:  Uterus: 13.2 x 6.9 x  7.6 cm.  Endometrium: Inhomogeneous measuring 25.5 mm.  There are areas of increased blood flow on Doppler evaluation.  Given the appearance, retained placental tissue should be considered.  Right ovary:  4.3 x 2.6 x 2.5 cm.  Multiple follicles are present.  Left ovary: 2.5 x 2.1 x 2.9 cm.  Multiple follicles are present.  Other findings: Trace free pelvic fluid.  IMPRESSION: Heterogeneous endometrial contents, measuring 25.5 mm in maximum depth.  Findingsare suspicious for retained placental tissue.  Normal-appearing ovaries.   Original Report Authenticated By: Norva Pavlov, M.D.    US Pelvis Complete  02/20/2013  *RADIOLOGY REPORT*  Clinical Data: 3 weeks postpartum.  Heavy bleeding with clots which started today.  TRANSABDOMINAL AND TRANSVAGINAL ULTRASOUND OF PELVIS Technique:  Both transabdominal and transvaginal ultrasound examinations of the pelvis were performed. Transabdominal technique was performed for global imaging of the pelvis including uterus, ovaries, adnexal regions, and pelvic cul-de-sac.  It was necessary to proceed with endovaginal exam following the transabdominal exam to visualize the uterus, endometrium, ovaries, adnexal regions.  Comparison:  None  Findings:  Uterus: 13.2 x 6.9 x 7.6 cm.  Endometrium: Inhomogeneous measuring 25.5 mm.  There are areas of increased blood flow on Doppler evaluation.  Given the appearance, retained placental tissue should be considered.  Right ovary:  4.3 x 2.6 x 2.5 cm.  Multiple follicles are present.  Left ovary: 2.5 x 2.1 x 2.9 cm.  Multiple follicles are present.  Other findings: Trace free pelvic fluid.  IMPRESSION: Heterogeneous endometrial contents, measuring 25.5 mm in maximum depth.  Findingsare suspicious for retained placental tissue. Normal-appearing ovaries.   Original Report Authenticated By: Norva Pavlov, M.D.      1. Retained products of conception with hemorrhage       MDM  26yo healthy F, 3 weeks PP, presents w/ heavy vaginal bleeding since 2pm today.  No associated sx w/ exception of single episode of lightheadedness.  Ob/Gyn at [facility].  On exam, HR 96, abd soft but diffusely, mildly ttp, heavy vaginal bleeding and L adnexal ttp.  Suspect retained products of conception.  Transvaginal US as well as Chem 8 ordered and pending.  9:00 PM   Pt wanted to leave but I convinced her to stay d/t severity of bleeding.  Hgb 10.9 but no prior for comparison.  VSS.  9:51 PM   US shows retained products of conception. Results  discussed w/ pt.  Consulted Dr. Shawnie Pons, Gyn, and she has accepted patient in transfer to [facility].  Pt stable. 12:19 AM      Otilio Miu, PA-C 02/21/13 [phone]

## 2013-02-20 NOTE — ED Notes (Signed)
US tech bedside

## 2013-02-20 NOTE — ED Provider Notes (Signed)
Presents with vaginal bleeding which was heavy today soaking through her clothes. Onset 20 0 p.m. which lasted approximately 5 hours the bleeding has slowed spontaneously without treatment is alert no distress  Doug Sou, MD 02/21/13 0100

## 2013-02-20 NOTE — ED Notes (Signed)
Pt states she had a baby 3 wo. And today began bleeding heavily and passing lots of blood clots. Pt states she is soaking a pad in 5-10 minutes. Denies pain. Skin pwd.

## 2013-02-21 ENCOUNTER — Encounter (HOSPITAL_COMMUNITY): Payer: Self-pay | Admitting: Registered Nurse

## 2013-02-21 ENCOUNTER — Inpatient Hospital Stay (HOSPITAL_COMMUNITY): Payer: Medicaid Other | Admitting: Registered Nurse

## 2013-02-21 ENCOUNTER — Ambulatory Visit: Admit: 2013-02-21 | Payer: Self-pay | Admitting: Family Medicine

## 2013-02-21 ENCOUNTER — Encounter (HOSPITAL_COMMUNITY)
Admission: EM | Disposition: A | Discharge status: Home / Self Care | Payer: Self-pay | Source: Home / Self Care | Attending: Emergency Medicine

## 2013-02-21 ENCOUNTER — Encounter (HOSPITAL_COMMUNITY): Payer: Self-pay | Admitting: *Deleted

## 2013-02-21 HISTORY — PX: DILATION AND EVACUATION: SHX1459

## 2013-02-21 LAB — CBC
HCT: 29 % — ABNORMAL LOW (ref 36.0–46.0)
Hemoglobin: 9.4 g/dL — ABNORMAL LOW (ref 12.0–15.0)
MCH: 27.3 pg (ref 26.0–34.0)
MCHC: 32.4 g/dL (ref 30.0–36.0)
MCV: 84.3 fL (ref 78.0–100.0)
Platelets: 221 K/uL (ref 150–400)
RBC: 3.44 MIL/uL — ABNORMAL LOW (ref 3.87–5.11)
RDW: 14 % (ref 11.5–15.5)
WBC: 13.2 K/uL — ABNORMAL HIGH (ref 4.0–10.5)

## 2013-02-21 LAB — ABO/RH: ABO/RH(D): A POS

## 2013-02-21 LAB — PREPARE RBC (CROSSMATCH)

## 2013-02-21 SURGERY — DILATION AND EVACUATION, UTERUS
Anesthesia: Monitor Anesthesia Care

## 2013-02-21 MED ORDER — MEPERIDINE HCL 25 MG/ML IJ SOLN: 6.2500 mg | INTRAMUSCULAR | Status: DC | PRN

## 2013-02-21 MED ORDER — FENTANYL CITRATE 0.05 MG/ML IJ SOLN
INTRAMUSCULAR | Status: DC | PRN
  Administered 2013-02-21 (×2): 50 ug via INTRAVENOUS

## 2013-02-21 MED ORDER — FENTANYL CITRATE 0.05 MG/ML IJ SOLN: 25.0000 ug | INTRAMUSCULAR | Status: DC | PRN

## 2013-02-21 MED ORDER — DOXYCYCLINE HYCLATE 100 MG PO CAPS: 100.0000 mg | ORAL_CAPSULE | Freq: Two times a day (BID) | ORAL | Status: DC

## 2013-02-21 MED ORDER — DEXAMETHASONE SODIUM PHOSPHATE 10 MG/ML IJ SOLN
INTRAMUSCULAR | Status: DC | PRN
  Administered 2013-02-21: 10 mg via INTRAVENOUS

## 2013-02-21 MED ORDER — BUPIVACAINE-EPINEPHRINE 0.25% -1:200000 IJ SOLN
INTRAMUSCULAR | Status: DC | PRN
  Administered 2013-02-21: 20 mL

## 2013-02-21 MED ORDER — KETOROLAC TROMETHAMINE 30 MG/ML IJ SOLN
INTRAMUSCULAR | Status: DC | PRN
  Administered 2013-02-21: 30 mg via INTRAVENOUS

## 2013-02-21 MED ORDER — DOXYCYCLINE HYCLATE 100 MG IV SOLR
100.0000 mg | INTRAVENOUS | Status: AC
  Administered 2013-02-21: 100 mg via INTRAVENOUS
  Filled 2013-02-21: qty 100

## 2013-02-21 MED ORDER — FAMOTIDINE IN NACL 20-0.9 MG/50ML-% IV SOLN
INTRAVENOUS | Status: AC
  Filled 2013-02-21: qty 50

## 2013-02-21 MED ORDER — LIDOCAINE HCL (CARDIAC) 20 MG/ML IV SOLN
INTRAVENOUS | Status: DC | PRN
  Administered 2013-02-21: 50 mg via INTRAVENOUS

## 2013-02-21 MED ORDER — ONDANSETRON HCL 4 MG/2ML IJ SOLN
INTRAMUSCULAR | Status: DC | PRN
  Administered 2013-02-21: 4 mg via INTRAVENOUS

## 2013-02-21 MED ORDER — KETOROLAC TROMETHAMINE 30 MG/ML IJ SOLN: 15.0000 mg | Freq: Once | INTRAMUSCULAR | Status: DC | PRN

## 2013-02-21 MED ORDER — MIDAZOLAM HCL 5 MG/5ML IJ SOLN
INTRAMUSCULAR | Status: DC | PRN
  Administered 2013-02-21: 2 mg via INTRAVENOUS

## 2013-02-21 MED ORDER — LACTATED RINGERS IV SOLN
INTRAVENOUS | Status: DC | PRN
  Administered 2013-02-21: 02:00:00 via INTRAVENOUS

## 2013-02-21 MED ORDER — PROPOFOL 10 MG/ML IV BOLUS
INTRAVENOUS | Status: DC | PRN
  Administered 2013-02-21 (×3): 20 mg via INTRAVENOUS
  Administered 2013-02-21: 40 mg via INTRAVENOUS

## 2013-02-21 MED ORDER — ONDANSETRON HCL 4 MG/2ML IJ SOLN: 4.0000 mg | Freq: Once | INTRAMUSCULAR | Status: DC | PRN

## 2013-02-21 MED ORDER — FAMOTIDINE IN NACL 20-0.9 MG/50ML-% IV SOLN: 20.0000 mg | Freq: Once | INTRAVENOUS | Status: DC

## 2013-02-21 SURGICAL SUPPLY — 22 items
CATH ROBINSON RED A/P 16FR (CATHETERS) ×2 IMPLANT
CLOTH BEACON ORANGE TIMEOUT ST (SAFETY) ×2 IMPLANT
DECANTER SPIKE VIAL GLASS SM (MISCELLANEOUS) ×2 IMPLANT
GLOVE BIOGEL PI IND STRL 7.0 (GLOVE) ×1 IMPLANT
GLOVE BIOGEL PI INDICATOR 7.0 (GLOVE) ×1
GLOVE ECLIPSE 7.0 STRL STRAW (GLOVE) ×4 IMPLANT
GOWN PREVENTION PLUS XLARGE (GOWN DISPOSABLE) ×2 IMPLANT
GOWN STRL REIN XL XLG (GOWN DISPOSABLE) ×4 IMPLANT
KIT BERKELEY 1ST TRIMESTER 3/8 (MISCELLANEOUS) ×2 IMPLANT
NDL SPNL 22GX3.5 QUINCKE BK (NEEDLE) ×1 IMPLANT
NEEDLE SPNL 22GX3.5 QUINCKE BK (NEEDLE) ×2 IMPLANT
NS IRRIG 1000ML POUR BTL (IV SOLUTION) ×2 IMPLANT
PACK VAGINAL MINOR WOMEN LF (CUSTOM PROCEDURE TRAY) ×2 IMPLANT
PAD OB MATERNITY 4.3X12.25 (PERSONAL CARE ITEMS) ×2 IMPLANT
PAD PREP 24X48 CUFFED NSTRL (MISCELLANEOUS) ×2 IMPLANT
SET BERKELEY SUCTION TUBING (SUCTIONS) ×2 IMPLANT
SYR CONTROL 10ML LL (SYRINGE) ×2 IMPLANT
TOWEL OR 17X24 6PK STRL BLUE (TOWEL DISPOSABLE) ×4 IMPLANT
VACURETTE 10 RIGID CVD (CANNULA) ×1 IMPLANT
VACURETTE 7MM CVD STRL WRAP (CANNULA) IMPLANT
VACURETTE 8 RIGID CVD (CANNULA) IMPLANT
VACURETTE 9 RIGID CVD (CANNULA) IMPLANT

## 2013-02-21 NOTE — Anesthesia Preprocedure Evaluation (Signed)
Anesthesia Evaluation  Patient identified by MRN, date of birth, ID band Patient awake    Reviewed: Allergy & Precautions, H&P , NPO status , Patient's Chart, lab work & pertinent test results  Airway Mallampati: I TM Distance: >3 FB Neck ROM: full    Dental no notable dental hx. (+) Teeth Intact   Pulmonary neg pulmonary ROS,    Pulmonary exam normal       Cardiovascular negative cardio ROS      Neuro/Psych negative neurological ROS  negative psych ROS   GI/Hepatic negative GI ROS, Neg liver ROS,   Endo/Other  negative endocrine ROS  Renal/GU negative Renal ROS  negative genitourinary   Musculoskeletal negative musculoskeletal ROS (+)   Abdominal Normal abdominal exam  (+)   Peds negative pediatric ROS (+)  Hematology negative hematology ROS (+)   Anesthesia Other Findings   Reproductive/Obstetrics negative OB ROS                           Anesthesia Physical Anesthesia Plan  ASA: I  Anesthesia Plan: MAC   Post-op Pain Management:    Induction: Intravenous  Airway Management Planned:   Additional Equipment:   Intra-op Plan:   Post-operative Plan:   Informed Consent: I have reviewed the patients History and Physical, chart, labs and discussed the procedure including the risks, benefits and alternatives for the proposed anesthesia with the patient or authorized representative who has indicated his/her understanding and acceptance.     Plan Discussed with: CRNA and Surgeon  Anesthesia Plan Comments:         Anesthesia Quick Evaluation

## 2013-02-21 NOTE — MAU Note (Signed)
Pt arrived via [facility] for post partum vaginal bleeding.

## 2013-02-21 NOTE — Op Note (Signed)
PROCEDURE DATE: 02/21/2013  PREOPERATIVE DIAGNOSIS: Retained placenta  POSTOPERATIVE DIAGNOSIS: The same  PROCEDURE:     Dilation and Evacuation.  SURGEON:  PRATT,TANYA S  INDICATIONS: 27 y.o. [id number] with recent SVD 3 wks ago, who presents with bleeding and u/s findings consistent with retained POC. Risks of surgery were discussed with the patient including but not limited to: bleeding which may require transfusion; infection which may require antibiotics; injury to uterus or surrounding organs.    FINDINGS:  An open cervical os, uterus enlarged, large amount of clot and bleeding, 2 x 3 chunk of placenta  ANESTHESIA:  MAC and paracervical block  ESTIMATED BLOOD LOSS:  Less than 20 ml.  SPECIMENS:  Uterine contents to Pathology  COMPLICATIONS:  None immediate.  PROCEDURE DETAILS:  The patient was then taken to the operating room where anesthesia was administered and was found to be adequate.  After an adequate timeout was performed, she was placed in the dorsal lithotomy position and examined; then prepped and draped in the sterile manner.   Her bladder was catheterized for an unmeasured amount of clear, yellow urine. A vaginal speculum was then placed in the patient's vagina and a single tooth tenaculum was applied to the anterior lip of the cervix.  A paracervical block using 0.25% Marcaine with Epi was administered.   A 10 F suction curette was advanced to top of the uterus. This was passed with removal of tissue 4 times, when a large chunk of placenta was noted at the tip of the currette.  A sharp curettage was then performed until a gritty texture was found in all four quadrants to ensure no placental parts remained.  The suction curette was passed again and no more tissue removed. There was minimal bleeding noted and a firm uterus was found on bimanual massage. The patient tolerated the procedure well.  The patient was taken to the recovery area in stable condition.  Reva Bores,  MD 02/21/2013, 2:37 AM

## 2013-02-21 NOTE — Preoperative (Signed)
Beta Blockers   Reason not to administer Beta Blockers:Not Applicable 

## 2013-02-21 NOTE — ED Provider Notes (Signed)
Medical screening examination/treatment/procedure(s) were conducted as a shared visit with non-physician practitioner(s) and myself.  I personally evaluated the patient during the encounter  Doug Sou, MD 02/21/13 0100

## 2013-02-21 NOTE — Transfer of Care (Signed)
Immediate Anesthesia Transfer of Care Note  Patient: Angela Manning  Procedure(s) Performed: Procedure(s): DILATATION AND EVACUATION (N/A)  Patient Location: PACU  Anesthesia Type:MAC  Level of Consciousness: sedated  Airway & Oxygen Therapy: Patient Spontanous Breathing and Patient connected to nasal cannula oxygen  Post-op Assessment: Report given to PACU RN  Post vital signs: Reviewed  Complications: No apparent anesthesia complications

## 2013-02-21 NOTE — H&P (Signed)
History    Chief Complaint   Patient presents with   .  Vaginal Bleeding    HPI  History provided by pt. Pt is 3 weeks PP. Had an uncomplicated vaginal birth at [facility]. Vaginal bleeding stopped 1 week ago but started again today and has been very heavy. Passing large clots and changing pad every 5-10 minutes. No associated fever, abdominal pain, N/V/D or other GU sx. Had a single episode of lightheadedness that resolved when she layed down. No PMH and is not anti-coagulated.   PMH: No pertinent past medical history.  PSH:  No pertinent past surgical history.  FH:  No pertinent family history.   History   Substance Use Topics   .  Smoking status:  Current Every Day Smoker -- 0.25 packs/day     Types:  Cigarettes   .  Smokeless tobacco:  Never Used   .  Alcohol Use:  No    OB History    Grav  Para  Term  Preterm  Abortions  TAB  SAB  Ect  Mult  Living                 Review of Systems  All other systems reviewed and are negative.   Allergies   Review of patient's allergies indicates no known allergies.   Home Medications   No current outpatient prescriptions on file.   BP 120/76  Pulse 102  Temp(Src) 99.1 F (37.3 C) (Oral)  Resp 16  Ht 5\' 7"  (1.702 m)  Wt 148 lb (67.132 kg)  BMI 23.17 kg/m2  SpO2 96%   Physical Exam  Nursing note and vitals reviewed.  Constitutional: She is oriented to person, place, and time. She appears well-developed and well-nourished. No distress.  HENT:  Head: Normocephalic and atraumatic.  Eyes:  Normal appearance  Neck: Normal range of motion.  Cardiovascular: Normal rate and regular rhythm.  Pulmonary/Chest: Effort normal and breath sounds normal. No respiratory distress.  Abdominal: Soft. Bowel sounds are normal. She exhibits no distension and no mass. There is no tenderness. There is no rebound and no guarding.  Genitourinary:  Nml external genitalia. Internal exam limited d/t heavy vaginal bleeding. Unable to visualize the cervix  but it feels closed and is mildly ttp. There is L adnexal tenderness as well.   Musculoskeletal: Normal range of motion.  Neurological: She is alert and oriented to person, place, and time.  Skin: Skin is warm and dry. No rash noted.  Psychiatric: She has a normal mood and affect. Her behavior is normal.    Labs Reviewed   POCT I-STAT, CHEM 8 - Abnormal; Notable for the following:    Glucose, Bld  121 (*)     Hemoglobin  10.9 (*)     HCT  32.0 (*)     All other components within normal limits    US Transvaginal Non-ob  02/20/2013 *RADIOLOGY REPORT* Clinical Data: 3 weeks postpartum. Heavy bleeding with clots which started today. TRANSABDOMINAL AND TRANSVAGINAL ULTRASOUND OF PELVIS Technique: Both transabdominal and transvaginal ultrasound examinations of the pelvis were performed. Transabdominal technique was performed for global imaging of the pelvis including uterus, ovaries, adnexal regions, and pelvic cul-de-sac. It was necessary to proceed with endovaginal exam following the transabdominal exam to visualize the uterus, endometrium, ovaries, adnexal regions. Comparison: None Findings: Uterus: 13.2 x 6.9 x 7.6 cm. Endometrium: Inhomogeneous measuring 25.5 mm. There are areas of increased blood flow on Doppler evaluation. Given the appearance, retained placental tissue should  be considered. Right ovary: 4.3 x 2.6 x 2.5 cm. Multiple follicles are present. Left ovary: 2.5 x 2.1 x 2.9 cm. Multiple follicles are present. Other findings: Trace free pelvic fluid. IMPRESSION: Heterogeneous endometrial contents, measuring 25.5 mm in maximum depth. Findingsare suspicious for retained placental tissue. Normal-appearing ovaries. Original Report Authenticated By: Norva Pavlov, M.D.  US Pelvis Complete  02/20/2013 *RADIOLOGY REPORT* Clinical Data: 3 weeks postpartum. Heavy bleeding with clots which started today. TRANSABDOMINAL AND TRANSVAGINAL ULTRASOUND OF PELVIS Technique: Both transabdominal and  transvaginal ultrasound examinations of the pelvis were performed. Transabdominal technique was performed for global imaging of the pelvis including uterus, ovaries, adnexal regions, and pelvic cul-de-sac. It was necessary to proceed with endovaginal exam following the transabdominal exam to visualize the uterus, endometrium, ovaries, adnexal regions. Comparison: None Findings: Uterus: 13.2 x 6.9 x 7.6 cm. Endometrium: Inhomogeneous measuring 25.5 mm. There are areas of increased blood flow on Doppler evaluation. Given the appearance, retained placental tissue should be considered. Right ovary: 4.3 x 2.6 x 2.5 cm. Multiple follicles are present. Left ovary: 2.5 x 2.1 x 2.9 cm. Multiple follicles are present. Other findings: Trace free pelvic fluid. IMPRESSION: Heterogeneous endometrial contents, measuring 25.5 mm in maximum depth. Findingsare suspicious for retained placental tissue. Normal-appearing ovaries. Original Report Authenticated By: Norva Pavlov, M.D.      Problem List Items Addressed This Visit     ICD-9-CM   *Retained products of conception with hemorrhage - Primary     Plan:  To OR for evacuation.  Risks include but are not limited to bleeding, infection,Uterine perforation, injury to surrounding structures, including bowel, bladder and ureters, blood clots, and death.  Likelihood of success is high.

## 2013-02-21 NOTE — Anesthesia Postprocedure Evaluation (Signed)
Anesthesia Post Note  Patient: Angela Manning  Procedure(s) Performed: Procedure(s) (LRB): DILATATION AND EVACUATION (N/A)  Anesthesia type: MAC  Patient location: PACU  Post pain: Pain level controlled  Post assessment: Post-op Vital signs reviewed  Last Vitals:  Filed Vitals:   02/21/13 0300  BP: 112/73  Pulse: 60  Temp: 36.4 C  Resp: 30    Post vital signs: Reviewed  Level of consciousness: sedated  Complications: No apparent anesthesia complications

## 2013-02-21 NOTE — ED Notes (Signed)
[facility] at bedside taking patient to MAU.  Called MAU at [facility] and they are expecting patient.

## 2013-02-22 ENCOUNTER — Encounter (HOSPITAL_COMMUNITY): Payer: Self-pay | Admitting: Family Medicine

## 2013-02-23 LAB — TYPE AND SCREEN
ABO/RH(D): A POS
Antibody Screen: NEGATIVE
Unit division: 0
Unit division: 0

## 2013-03-05 ENCOUNTER — Inpatient Hospital Stay (HOSPITAL_COMMUNITY)
Admission: AD | Admit: 2013-03-05 | Discharge: 2013-03-05 | Disposition: A | Discharge status: Home / Self Care | Payer: Medicaid Other | Source: Ambulatory Visit | Attending: Obstetrics & Gynecology | Admitting: Obstetrics & Gynecology

## 2013-03-05 ENCOUNTER — Inpatient Hospital Stay (HOSPITAL_COMMUNITY): Payer: Medicaid Other

## 2013-03-05 ENCOUNTER — Encounter (HOSPITAL_COMMUNITY): Payer: Self-pay | Admitting: *Deleted

## 2013-03-05 DIAGNOSIS — R109 Unspecified abdominal pain: Secondary | ICD-10-CM | POA: Insufficient documentation

## 2013-03-05 DIAGNOSIS — O99893 Other specified diseases and conditions complicating puerperium: Secondary | ICD-10-CM | POA: Insufficient documentation

## 2013-03-05 DIAGNOSIS — O8612 Endometritis following delivery: Secondary | ICD-10-CM

## 2013-03-05 HISTORY — DX: Gestational (pregnancy-induced) hypertension without significant proteinuria, unspecified trimester: O13.9

## 2013-03-05 LAB — COMPREHENSIVE METABOLIC PANEL
ALT: 6 U/L (ref 0–35)
AST: 10 U/L (ref 0–37)
Albumin: 3.1 g/dL — ABNORMAL LOW (ref 3.5–5.2)
Alkaline Phosphatase: 75 U/L (ref 39–117)
BUN: 10 mg/dL (ref 6–23)
CO2: 24 mEq/L (ref 19–32)
Calcium: 9.4 mg/dL (ref 8.4–10.5)
Chloride: 102 mEq/L (ref 96–112)
Creatinine, Ser: 0.72 mg/dL (ref 0.50–1.10)
GFR calc Af Amer: >90 mL/min (ref >90)
GFR calc non Af Amer: >90 mL/min (ref >90)
Glucose, Bld: 80 mg/dL (ref 70–99)
Potassium: 3.6 mEq/L (ref 3.5–5.1)
Sodium: 140 mEq/L (ref 135–145)
Total Bilirubin: 0.2 mg/dL — ABNORMAL LOW (ref 0.3–1.2)
Total Protein: 6.5 g/dL (ref 6.0–8.3)

## 2013-03-05 LAB — CBC
HCT: 26.6 % — ABNORMAL LOW (ref 36.0–46.0)
Hemoglobin: 8.4 g/dL — ABNORMAL LOW (ref 12.0–15.0)
MCH: 25.8 pg — ABNORMAL LOW (ref 26.0–34.0)
MCHC: 31.6 g/dL (ref 30.0–36.0)
MCV: 81.8 fL (ref 78.0–100.0)
Platelets: 320 10*3/uL (ref 150–400)
RBC: 3.25 MIL/uL — ABNORMAL LOW (ref 3.87–5.11)
RDW: 14.1 % (ref 11.5–15.5)
WBC: 13.2 10*3/uL — ABNORMAL HIGH (ref 4.0–10.5)

## 2013-03-05 LAB — URINE MICROSCOPIC-ADD ON

## 2013-03-05 LAB — URINALYSIS, ROUTINE W REFLEX MICROSCOPIC
Bilirubin Urine: NEGATIVE
Glucose, UA: NEGATIVE mg/dL
Ketones, ur: 15 mg/dL — AB
Nitrite: NEGATIVE
Protein, ur: NEGATIVE mg/dL
Specific Gravity, Urine: >1.03 — ABNORMAL HIGH (ref 1.005–1.030)
Urobilinogen, UA: 0.2 mg/dL (ref 0.0–1.0)
pH: 6 (ref 5.0–8.0)

## 2013-03-05 MED ORDER — CEPHALEXIN 500 MG PO CAPS: 500.0000 mg | ORAL_CAPSULE | Freq: Four times a day (QID) | ORAL | Status: DC

## 2013-03-05 MED ORDER — OXYCODONE-ACETAMINOPHEN 5-325 MG PO TABS: 1.0000 | ORAL_TABLET | ORAL | Status: DC | PRN

## 2013-03-05 MED ORDER — HYDROMORPHONE HCL 2 MG PO TABS
1.0000 mg | ORAL_TABLET | Freq: Once | ORAL | Status: AC
  Administered 2013-03-05: 1 mg via ORAL
  Filled 2013-03-05 (×2): qty 1

## 2013-03-05 NOTE — MAU Note (Signed)
Vag deliver 02/21 in [place]>  Had D&C here a couple wks ago for retained placenta.  Pain started last Friday.  Felt like gas pain at first, was off and on at first, has now gotten worse. Sometimes feels like contractions.

## 2013-03-05 NOTE — Progress Notes (Signed)
MD informed of pt status, see orders for labs & pain med, CNM will be coming to see pt.

## 2013-03-05 NOTE — MAU Provider Note (Signed)
History     CSN: [id number]  Arrival date and time: 03/05/13 1346   First Provider Initiated Contact with Patient 03/05/13 1503      Chief Complaint  Patient presents with  . Abdominal Pain   HPI This is a 27 y.o. female who is 2 weeks post D&C for retained POC who presents with one week hx of LLQ pain, which has worsened over time. Had a delivery in [facility] in [place] (was visiting out of town, had [facility]).  Then 3 weeks later had bleeding and was found to have retained placenta, treated with D&C by Dr Shawnie Pons.  RN Note: Vag deliver 02/21 in [place]> Had D&C here a couple wks ago for retained placenta. Pain started last Friday. Felt like gas pain at first, was off and on at first, has now gotten worse. Sometimes feels like contractions.      OB History   Grav Para Term Preterm Abortions TAB SAB Ect Mult Living   4 4 3 1      4       Past Medical History  Diagnosis Date  . Pregnancy induced hypertension     Past Surgical History  Procedure Laterality Date  . No past surgeries    . Dilation and evacuation N/A 02/21/2013    Procedure: DILATATION AND EVACUATION;  Surgeon: Reva Bores, MD;  Location: [facility];  Service: Gynecology;  Laterality: N/A;    Family History  Problem Relation Age of Onset  . Hypertension Mother     History  Substance Use Topics  . Smoking status: Former Smoker -- 0.25 packs/day  . Smokeless tobacco: Never Used  . Alcohol Use: No    Allergies: No Known Allergies  No prescriptions prior to admission    Review of Systems  Constitutional: Negative for fever, chills and malaise/fatigue.  Cardiovascular: Negative for chest pain.  Gastrointestinal: Positive for abdominal pain. Negative for nausea, vomiting, diarrhea and constipation.  Genitourinary: Negative for dysuria.  Musculoskeletal: Negative for myalgias.  Neurological: Negative for weakness.   Physical Exam   Blood pressure 141/91, pulse 73, temperature 98.5 F (36.9 C), temperature  source Oral, resp. rate 18, height 5\' 5"  (1.651 m), weight 139 lb (63.05 kg).  Physical Exam  Constitutional: She is oriented to person, place, and time. She appears well-developed and well-nourished. No distress.  HENT:  Head: Normocephalic.  Cardiovascular: Normal rate.   Respiratory: Effort normal.  GI: Soft. She exhibits no distension and no mass. There is tenderness (over lower abdomen). There is no rebound and no guarding.  Genitourinary: Vagina normal and uterus normal. No vaginal discharge found.  Musculoskeletal: Normal range of motion.  Neurological: She is alert and oriented to person, place, and time.  Skin: Skin is warm and dry.  Psychiatric: She has a normal mood and affect.    MAU Course  Procedures  MDM Results for orders placed during the hospital encounter of 03/05/13 (from the past 24 hour(s))  CBC     Status: Abnormal   Collection Time    03/05/13  2:51 PM      Result Value Range   WBC 13.2 (*) 4.0 - 10.5 K/uL   RBC 3.25 (*) 3.87 - 5.11 MIL/uL   Hemoglobin 8.4 (*) 12.0 - 15.0 g/dL   HCT 95.6 (*) 21.3 - 08.6 %   MCV 81.8  78.0 - 100.0 fL   MCH 25.8 (*) 26.0 - 34.0 pg   MCHC 31.6  30.0 - 36.0 g/dL  RDW 14.1  11.5 - 15.5 %   Platelets 320  150 - 400 K/uL  COMPREHENSIVE METABOLIC PANEL     Status: Abnormal   Collection Time    03/05/13  2:51 PM      Result Value Range   Sodium 140  135 - 145 mEq/L   Potassium 3.6  3.5 - 5.1 mEq/L   Chloride 102  96 - 112 mEq/L   CO2 24  19 - 32 mEq/L   Glucose, Bld 80  70 - 99 mg/dL   BUN 10  6 - 23 mg/dL   Creatinine, Ser 1.61  0.50 - 1.10 mg/dL   Calcium 9.4  8.4 - 09.6 mg/dL   Total Protein 6.5  6.0 - 8.3 g/dL   Albumin 3.1 (*) 3.5 - 5.2 g/dL   AST 10  0 - 37 U/L   ALT 6  0 - 35 U/L   Alkaline Phosphatase 75  39 - 117 U/L   Total Bilirubin 0.2 (*) 0.3 - 1.2 mg/dL   GFR calc non Af Amer >90  >90 mL/min   GFR calc Af Amer >90  >90 mL/min  URINALYSIS, ROUTINE W REFLEX MICROSCOPIC     Status: Abnormal    Collection Time    03/05/13  3:30 PM      Result Value Range   Color, Urine YELLOW  YELLOW   APPearance CLEAR  CLEAR   Specific Gravity, Urine >1.030 (*) 1.005 - 1.030   pH 6.0  5.0 - 8.0   Glucose, UA NEGATIVE  NEGATIVE mg/dL   Hgb urine dipstick TRACE (*) NEGATIVE   Bilirubin Urine NEGATIVE  NEGATIVE   Ketones, ur 15 (*) NEGATIVE mg/dL   Protein, ur NEGATIVE  NEGATIVE mg/dL   Urobilinogen, UA 0.2  0.0 - 1.0 mg/dL   Nitrite NEGATIVE  NEGATIVE   Leukocytes, UA SMALL (*) NEGATIVE  URINE MICROSCOPIC-ADD ON     Status: None   Collection Time    03/05/13  3:30 PM      Result Value Range   Squamous Epithelial / LPF RARE  RARE   WBC, UA 21-50  <3 WBC/hpf   RBC / HPF 3-6  <3 RBC/hpf   Bacteria, UA RARE  RARE   Urine-Other MUCOUS PRESENT     US Pelvis Complete  03/05/2013  *RADIOLOGY REPORT*  Clinical Data: 2 weeks status post D&C for retained products of conception.  1 week of left lower quadrant pain.  Postpartum with delivery on 01/29/2013.  TRANSABDOMINAL AND TRANSVAGINAL ULTRASOUND OF PELVIS Technique:  Both transabdominal and transvaginal ultrasound examinations of the pelvis were performed. Transabdominal technique was performed for global imaging of the pelvis including uterus, ovaries, adnexal regions, and pelvic cul-de-sac.  It was necessary to proceed with endovaginal exam following the transabdominal exam to visualize the endometrium and adnexa.  Comparison:  02/20/2013  Findings:  Uterus: Demonstrates a sagittal length of 7 cm, depth of 6 cm and width of 6.5 cm compatible the patient's postpartum status.  A homogeneous myometrium is noted  Endometrium: Appears thin with no evidence for retained products of conception sonographically.  A small amount of fluid is seen within the endometrial canal and no abnormal vascularity is identified with color Doppler assessment  Right ovary:  Measures 3.0 x 2.2 x 4.25 cm and has a normal appearance  Left ovary: Measures 2.5 x 1.6 by 2.0 cm and has  a normal appearance.  Other findings: Adjacent to the left ovary and best seen on cine  loop evaluation is a small serpiginous fluid filled area suspicious for hydrosalpinx.  No pelvic fluid is noted.  IMPRESSION: No evidence for retained products of conception sonographically.  Question small left hydrosalpinx.   Original Report Authenticated By: Rhodia Albright, M.D.     Assessment and Plan  A: 2 weeks s/p D&C      Probable endometritis     Possible small left hydrosalpinx  P:  Will discharge home        Rx 10 days of Keflex       Keep appt in clinic       Come back if pain worsens or develops fever  [facility] 03/05/2013, 3:05 PM

## 2013-03-11 ENCOUNTER — Ambulatory Visit: Payer: Medicaid Other | Admitting: Family Medicine

## 2013-04-12 ENCOUNTER — Encounter (HOSPITAL_COMMUNITY): Payer: Self-pay | Admitting: Emergency Medicine

## 2013-04-12 ENCOUNTER — Emergency Department (HOSPITAL_COMMUNITY)
Admission: EM | Admit: 2013-04-12 | Discharge: 2013-04-12 | Disposition: A | Discharge status: Home / Self Care | Payer: Medicaid Other | Attending: Emergency Medicine | Admitting: Emergency Medicine

## 2013-04-12 DIAGNOSIS — L2989 Other pruritus: Secondary | ICD-10-CM | POA: Insufficient documentation

## 2013-04-12 DIAGNOSIS — L298 Other pruritus: Secondary | ICD-10-CM | POA: Insufficient documentation

## 2013-04-12 DIAGNOSIS — R21 Rash and other nonspecific skin eruption: Secondary | ICD-10-CM | POA: Insufficient documentation

## 2013-04-12 DIAGNOSIS — L299 Pruritus, unspecified: Secondary | ICD-10-CM

## 2013-04-12 DIAGNOSIS — Z87891 Personal history of nicotine dependence: Secondary | ICD-10-CM | POA: Insufficient documentation

## 2013-04-12 DIAGNOSIS — Z8679 Personal history of other diseases of the circulatory system: Secondary | ICD-10-CM | POA: Insufficient documentation

## 2013-04-12 MED ORDER — HYDROCORTISONE 1 % EX CREA: TOPICAL_CREAM | CUTANEOUS | Status: DC

## 2013-04-12 MED ORDER — PERMETHRIN 5 % EX CREA: TOPICAL_CREAM | CUTANEOUS | Status: DC

## 2013-04-12 MED ORDER — HYDROXYZINE HCL 25 MG PO TABS: 25.0000 mg | ORAL_TABLET | Freq: Four times a day (QID) | ORAL | Status: DC

## 2013-04-12 MED ORDER — HYDROXYZINE HCL 25 MG PO TABS
50.0000 mg | ORAL_TABLET | Freq: Once | ORAL | Status: AC
  Administered 2013-04-12: 50 mg via ORAL
  Filled 2013-04-12: qty 2

## 2013-04-12 NOTE — ED Provider Notes (Signed)
History    This chart was scribed for non-physician practitioner Jaynie Crumble, PA-C working with Raeford Razor, MD by Gerlean Ren, ED Scribe. This patient was seen in room WTR5/WTR5 and the patient's care was started at 3:38 PM.    CSN: [id number]  Arrival date & time 04/12/13  1512   First MD Initiated Contact with Patient 04/12/13 1534      No chief complaint on file.   The history is provided by the patient. No language interpreter was used.  Angela Manning is a 27 y.o. female who presents to the Emergency Department complaining of an itching rash over that began 1.5 weeks ago on bilateral upper arms and has gradually spread the the chest, back, and bilateral upper legs.  Pt states her roommate has recently started showing similar itching bumps.  Pt recently stayed in a new hotel.  Pt reports recently changing detergents.  No known allergies.     Past Medical History  Diagnosis Date  . Pregnancy induced hypertension     Past Surgical History  Procedure Laterality Date  . No past surgeries    . Dilation and evacuation N/A 02/21/2013    Procedure: DILATATION AND EVACUATION;  Surgeon: Reva Bores, MD;  Location: [facility];  Service: Gynecology;  Laterality: N/A;    Family History  Problem Relation Age of Onset  . Hypertension Mother     History  Substance Use Topics  . Smoking status: Former Smoker -- 0.25 packs/day  . Smokeless tobacco: Never Used  . Alcohol Use: No    OB History   Grav Para Term Preterm Abortions TAB SAB Ect Mult Living   4 4 3 1      4       Review of Systems  Constitutional: Negative for fever and chills.  Respiratory: Negative for shortness of breath.   Gastrointestinal: Negative for nausea and vomiting.  Skin: Positive for rash.  Neurological: Negative for weakness.  All other systems reviewed and are negative.    Allergies  Review of patient's allergies indicates no known allergies.  Home Medications   Current Outpatient Rx  Name   Route  Sig  Dispense  Refill  . cephALEXin (KEFLEX) 500 MG capsule   Oral   Take 1 capsule (500 mg total) by mouth 4 (four) times daily.   40 capsule   0     BP 139/84  Pulse 81  Temp(Src) 98.2 F (36.8 C) (Oral)  SpO2 100%  LMP 03/25/2013  Physical Exam  Nursing note and vitals reviewed. Constitutional: She is oriented to person, place, and time. She appears well-developed and well-nourished. No distress.  HENT:  Head: Normocephalic and atraumatic.  Nose: Nose normal.  Mouth/Throat: Oropharynx is clear and moist.  Eyes: EOM are normal.  Neck: Neck supple. No tracheal deviation present.  Cardiovascular: Normal rate.   Pulmonary/Chest: Effort normal. No respiratory distress.  Musculoskeletal: Normal range of motion.  Neurological: She is alert and oriented to person, place, and time.  Skin: Skin is warm and dry. Rash noted.  Skin colored papules mainly over back of thighs, lower back, and chest.  Excoriations noted.  No rash noted to hands or lower legs.    Psychiatric: She has a normal mood and affect. Her behavior is normal.    ED Course  Procedures (including critical care time) DIAGNOSTIC STUDIES: Oxygen Saturation is 100% on room air, normal by my interpretation.    COORDINATION OF CARE: 3:42 PM- Informed pt that the rash  does not appear to be scabies but that I will provide the treatment cream to treat for scabies.  Informed pt of cream use.  Discussed itch relief at home.  Pt verbalizes understanding and agrees.    Labs Reviewed - No data to display No results found.   1. Rash   2. Itching       MDM  Pt with rash over parts of her body. Rash is very itchy. Pt appaears other wise non toxic. No mucosal involvement. No respiratory problems. Does not appear to look like scabies, but pt requesting treatment for it. Will treat with vistaril. Switch to hypoallergenic detergent and soap. Topical hydrocortisone. Follow up with pcp.     I personally performed the  services described in this documentation, which was scribed in my presence. The recorded information has been reviewed and is accurate.    Lottie Mussel, PA-C 04/12/13 1608

## 2013-04-12 NOTE — ED Notes (Addendum)
Pt states that she has had a generalized rash with bumps all over her body for approx. A week. Sister recently had scabies.

## 2013-04-14 NOTE — ED Provider Notes (Signed)
Medical screening examination/treatment/procedure(s) were performed by non-physician practitioner and as supervising physician I was immediately available for consultation/collaboration.  Neola Worrall, MD 04/14/13 0054 

## 2013-05-18 ENCOUNTER — Encounter (HOSPITAL_COMMUNITY): Payer: Self-pay | Admitting: Emergency Medicine

## 2013-05-18 DIAGNOSIS — R05 Cough: Secondary | ICD-10-CM | POA: Insufficient documentation

## 2013-05-18 DIAGNOSIS — R0602 Shortness of breath: Secondary | ICD-10-CM | POA: Insufficient documentation

## 2013-05-18 DIAGNOSIS — R059 Cough, unspecified: Secondary | ICD-10-CM | POA: Insufficient documentation

## 2013-05-18 DIAGNOSIS — Z3202 Encounter for pregnancy test, result negative: Secondary | ICD-10-CM | POA: Insufficient documentation

## 2013-05-18 DIAGNOSIS — F17200 Nicotine dependence, unspecified, uncomplicated: Secondary | ICD-10-CM | POA: Insufficient documentation

## 2013-05-18 DIAGNOSIS — R0789 Other chest pain: Secondary | ICD-10-CM | POA: Insufficient documentation

## 2013-05-18 DIAGNOSIS — R6889 Other general symptoms and signs: Secondary | ICD-10-CM | POA: Insufficient documentation

## 2013-05-18 NOTE — ED Notes (Signed)
Patient with chest pain for the last week off and on.  Patient denies any nausea or vomiting or shortness of breath.

## 2013-05-19 ENCOUNTER — Encounter (HOSPITAL_COMMUNITY): Payer: Self-pay

## 2013-05-19 ENCOUNTER — Emergency Department (HOSPITAL_COMMUNITY)
Admission: EM | Admit: 2013-05-19 | Discharge: 2013-05-19 | Disposition: A | Discharge status: Home / Self Care | Payer: Medicaid Other | Attending: Emergency Medicine | Admitting: Emergency Medicine

## 2013-05-19 ENCOUNTER — Emergency Department (HOSPITAL_COMMUNITY)
Admission: EM | Admit: 2013-05-19 | Discharge: 2013-05-19 | Disposition: A | Discharge status: Home / Self Care | Payer: Medicaid Other | Source: Home / Self Care | Attending: Emergency Medicine | Admitting: Emergency Medicine

## 2013-05-19 ENCOUNTER — Emergency Department (HOSPITAL_COMMUNITY)
Admit: 2013-05-19 | Discharge: 2013-05-19 | Disposition: A | Discharge status: Home / Self Care | Payer: Medicaid Other | Attending: Emergency Medicine | Admitting: Emergency Medicine

## 2013-05-19 DIAGNOSIS — R079 Chest pain, unspecified: Secondary | ICD-10-CM

## 2013-05-19 LAB — POCT I-STAT, CHEM 8
BUN: 16 mg/dL (ref 6–23)
Calcium, Ion: 1.31 mmol/L — ABNORMAL HIGH (ref 1.12–1.23)
Chloride: 107 mEq/L (ref 96–112)
Creatinine, Ser: 0.7 mg/dL (ref 0.50–1.10)
Glucose, Bld: 61 mg/dL — ABNORMAL LOW (ref 70–99)
HCT: 39 % (ref 36.0–46.0)
Hemoglobin: 13.3 g/dL (ref 12.0–15.0)
Potassium: 3.4 mEq/L — ABNORMAL LOW (ref 3.5–5.1)
Sodium: 141 mEq/L (ref 135–145)
TCO2: 25 mmol/L (ref 0–100)

## 2013-05-19 LAB — CBC
HCT: 35.4 % — ABNORMAL LOW (ref 36.0–46.0)
Hemoglobin: 11.3 g/dL — ABNORMAL LOW (ref 12.0–15.0)
MCH: 22.1 pg — ABNORMAL LOW (ref 26.0–34.0)
MCHC: 31.9 g/dL (ref 30.0–36.0)
MCV: 69.1 fL — ABNORMAL LOW (ref 78.0–100.0)
Platelets: 236 10*3/uL (ref 150–400)
RBC: 5.12 MIL/uL — ABNORMAL HIGH (ref 3.87–5.11)
RDW: 16.6 % — ABNORMAL HIGH (ref 11.5–15.5)
WBC: 8.1 10*3/uL (ref 4.0–10.5)

## 2013-05-19 LAB — BASIC METABOLIC PANEL
BUN: 15 mg/dL (ref 6–23)
CO2: 25 mEq/L (ref 19–32)
Calcium: 9.6 mg/dL (ref 8.4–10.5)
Chloride: 105 mEq/L (ref 96–112)
Creatinine, Ser: 0.59 mg/dL (ref 0.50–1.10)
GFR calc Af Amer: >90 mL/min (ref >90)
GFR calc non Af Amer: >90 mL/min (ref >90)
Glucose, Bld: 76 mg/dL (ref 70–99)
Potassium: 3.6 mEq/L (ref 3.5–5.1)
Sodium: 139 mEq/L (ref 135–145)

## 2013-05-19 LAB — D-DIMER, QUANTITATIVE: D-Dimer, Quant: 0.83 ug/mL-FEU — ABNORMAL HIGH (ref 0.00–0.48)

## 2013-05-19 LAB — POCT PREGNANCY, URINE: Preg Test, Ur: NEGATIVE

## 2013-05-19 LAB — POCT I-STAT TROPONIN I: Troponin i, poc: 0.01 ng/mL (ref 0.00–0.08)

## 2013-05-19 MED ORDER — GI COCKTAIL ~~LOC~~: 30.0000 mL | Freq: Once | ORAL | Status: DC

## 2013-05-19 MED ORDER — IOHEXOL 350 MG/ML SOLN: 100.0000 mL | Freq: Once | INTRAVENOUS | Status: DC | PRN

## 2013-05-19 MED ORDER — IOHEXOL 350 MG/ML SOLN
100.0000 mL | Freq: Once | INTRAVENOUS | Status: AC | PRN
  Administered 2013-05-19: 100 mL via INTRAVENOUS

## 2013-05-19 MED ORDER — ASPIRIN 81 MG PO CHEW
CHEWABLE_TABLET | ORAL | Status: AC
  Filled 2013-05-19: qty 4

## 2013-05-19 MED ORDER — HYDROCODONE-ACETAMINOPHEN 5-325 MG PO TABS: 1.0000 | ORAL_TABLET | ORAL | Status: DC | PRN

## 2013-05-19 NOTE — ED Notes (Signed)
Patient refused the GI cocktail

## 2013-05-19 NOTE — ED Provider Notes (Signed)
5:20 AM Please see Dr Brandy Hale downtime paper note for history and physical  CT angiogram without evidence of pulmonary embolism.  EKG and troponin are normal.  This is not a presentation of ACS or pulmonary embolism.  The patient still has a feeling of some heaviness in her chest.  This is likely more gastroesophageal reflux disease.  Home with instructions for daily Prilosec.  GI cocktail now.  Discharge home in good condition.  The encounter diagnosis was Chest pain.  Dg Chest 2 View  05/19/2013   *RADIOLOGY REPORT*  Clinical Data: Shortness of breath and chest pain for 1 week.  CHEST - 2 VIEW  Comparison: None.  Findings: The heart size and pulmonary vascularity are normal. The lungs appear clear and expanded without focal air space disease or consolidation. No blunting of the costophrenic angles.  No pneumothorax.  Mediastinal contours appear intact.  IMPRESSION: No evidence of active pulmonary disease.   Original Report Authenticated By: Burman Nieves, M.D.   Ct Angio Chest Pe W/cm &/or Wo Cm  05/19/2013   *RADIOLOGY REPORT*  Clinical Data: Chest pain.  Elevated D-dimer.  Shortness of breath.  CT ANGIOGRAPHY CHEST  Technique:  Multidetector CT imaging of the chest using the standard protocol during bolus administration of intravenous contrast. Multiplanar reconstructed images including MIPs were obtained and reviewed to evaluate the vascular anatomy.  Contrast: OMNIPAQUE IOHEXOL 350 MG/ML SOLN  Comparison: None.  Findings: Technically adequate study with good opacification of the central and segmental pulmonary arteries.  No focal filling defects are identified.  No evidence of significant pulmonary embolus.  Normal heart size.  Normal caliber thoracic aorta.  No evidence of dissection.  Mild increased density in the anterior mediastinum is likely due to residual thymic tissue.  No significant lymphadenopathy in the chest.  The esophagus is decompressed.  No pleural effusion.  No  pneumothorax.  Minimal dependent changes in the lungs.  No evidence of significant focal consolidation, airspace disease, or interstitial infiltration.  The airways appear patent.  Normal alignment of the thoracic vertebrae.  IMPRESSION: No evidence of significant pulmonary embolus.   Original Report Authenticated By: Burman Nieves, M.D.  I personally reviewed the imaging tests through PACS system I reviewed available ER/hospitalization records through the EMR   Lyanne Co, MD 05/19/13 0522

## 2013-05-29 NOTE — ED Provider Notes (Signed)
History     CSN: [id number]  Arrival date & time 05/18/13  2341   First MD Initiated Contact with Patient 05/19/13 0420      Chief Complaint  Patient presents with  . Chest Pain    (Consider location/radiation/quality/duration/timing/severity/associated sxs/prior treatment) HPI Comments: Pt seen on 6/11 - early in the morning.  Pt with no medical problems, no family hx of premature CAD comes in with cc of chest pain. Pt states that she has been having some chest heaviness and pain, worse with inspiration for the past few days. There is a mild cough, no hemoptysis. No fevers, chills. No hx of PE, DVT and no risk factors for the same.  Patient is a 27 y.o. female presenting with chest pain. The history is provided by the patient.  Chest Pain Associated symptoms: shortness of breath   Associated symptoms: no abdominal pain, no headache, no nausea and not vomiting     Past Medical History  Diagnosis Date  . Pregnancy induced hypertension     Past Surgical History  Procedure Laterality Date  . No past surgeries    . Dilation and evacuation N/A 02/21/2013    Procedure: DILATATION AND EVACUATION;  Surgeon: Reva Bores, MD;  Location: [facility];  Service: Gynecology;  Laterality: N/A;    Family History  Problem Relation Age of Onset  . Hypertension Mother     History  Substance Use Topics  . Smoking status: Current Every Day Smoker -- 0.25 packs/day    Types: Cigarettes  . Smokeless tobacco: Never Used  . Alcohol Use: No    OB History   Grav Para Term Preterm Abortions TAB SAB Ect Mult Living   4 4 3 1      4       Review of Systems  Constitutional: Negative for activity change.  HENT: Negative for neck pain.   Respiratory: Positive for choking and shortness of breath.   Cardiovascular: Positive for chest pain.  Gastrointestinal: Negative for nausea, vomiting and abdominal pain.  Genitourinary: Negative for dysuria.  Neurological: Negative for headaches.     Allergies  Review of patient's allergies indicates no known allergies.  Home Medications   Current Outpatient Rx  Name  Route  Sig  Dispense  Refill  . HYDROcodone-acetaminophen (NORCO/VICODIN) 5-325 MG per tablet   Oral   Take 1 tablet by mouth every 4 (four) hours as needed for pain.   15 tablet   0     BP 130/74  Pulse 81  Temp(Src) 98.2 F (36.8 C) (Oral)  Resp 16  SpO2 98%  LMP 04/24/2013  Physical Exam  Nursing note and vitals reviewed. Constitutional: She is oriented to person, place, and time. She appears well-developed and well-nourished.  HENT:  Head: Normocephalic and atraumatic.  Eyes: EOM are normal. Pupils are equal, round, and reactive to light.  Neck: Neck supple.  Cardiovascular: Normal rate, regular rhythm and normal heart sounds.   No murmur heard. Pulmonary/Chest: Effort normal. No respiratory distress.  Abdominal: Soft. She exhibits no distension. There is no tenderness. There is no rebound and no guarding.  Neurological: She is alert and oriented to person, place, and time.  Skin: Skin is warm and dry.    ED Course  Procedures (including critical care time)  Labs Reviewed  CBC - Abnormal; Notable for the following:    RBC 5.12 (*)    Hemoglobin 11.3 (*)    HCT 35.4 (*)    MCV 69.1 (*)  MCH 22.1 (*)    RDW 16.6 (*)    All other components within normal limits  BASIC METABOLIC PANEL  POCT PREGNANCY, URINE   No results found.   1. Chest pain       MDM   Date: 05/29/2013  Rate: 70  Rhythm: normal sinus rhythm  QRS Axis: normal  Intervals: normal  ST/T Wave abnormalities: normal  Conduction Disutrbances: none  Narrative Interpretation: right atrial enlargement, q waves inferior leads  Differential diagnosis includes: ACS syndrome CHF exacerbation Valvular disorder Myocarditis Pericarditis Pericardial effusion Pneumonia Pleural effusion Pulmonary edema PE Anemia Musculoskeletal pain  Pt comes in with some  chest heavyness. Pts EKG shows some q waves, inferior leads. Trops x 2 ordered. No premature CAD, no smoking, drug use. Cardiac risk factors-  - 1 - as patient is having periods.  Pt's pain is very pleuritic. Her WELLS score is 0, and is PERC neg, however - given the symptomology - dimer ordered - which was slightly elevated - so CT PE ordered - which was negative.  D/c with return precautions.       Derwood Kaplan, MD 05/29/13 [phone]

## 2013-06-05 ENCOUNTER — Encounter (HOSPITAL_COMMUNITY): Payer: Self-pay | Admitting: Emergency Medicine

## 2013-06-05 ENCOUNTER — Other Ambulatory Visit (HOSPITAL_COMMUNITY)
Admission: RE | Admit: 2013-06-05 | Discharge: 2013-06-05 | Disposition: A | Discharge status: Home / Self Care | Payer: Medicaid Other | Source: Ambulatory Visit | Attending: Emergency Medicine | Admitting: Emergency Medicine

## 2013-06-05 ENCOUNTER — Emergency Department (HOSPITAL_COMMUNITY)
Admission: EM | Admit: 2013-06-05 | Discharge: 2013-06-05 | Disposition: A | Discharge status: Home / Self Care | Payer: Medicaid Other | Source: Home / Self Care

## 2013-06-05 DIAGNOSIS — N390 Urinary tract infection, site not specified: Secondary | ICD-10-CM

## 2013-06-05 DIAGNOSIS — Z113 Encounter for screening for infections with a predominantly sexual mode of transmission: Secondary | ICD-10-CM | POA: Insufficient documentation

## 2013-06-05 DIAGNOSIS — N760 Acute vaginitis: Secondary | ICD-10-CM | POA: Insufficient documentation

## 2013-06-05 LAB — POCT URINALYSIS DIP (DEVICE)
Bilirubin Urine: NEGATIVE
Glucose, UA: NEGATIVE mg/dL
Ketones, ur: NEGATIVE mg/dL
Nitrite: NEGATIVE
Protein, ur: 100 mg/dL — AB
Specific Gravity, Urine: 1.02 (ref 1.005–1.030)
Urobilinogen, UA: 0.2 mg/dL (ref 0.0–1.0)
pH: 6.5 (ref 5.0–8.0)

## 2013-06-05 LAB — POCT PREGNANCY, URINE: Preg Test, Ur: NEGATIVE

## 2013-06-05 MED ORDER — CEPHALEXIN 250 MG PO CAPS: 250.0000 mg | ORAL_CAPSULE | Freq: Four times a day (QID) | ORAL | Status: DC

## 2013-06-05 NOTE — ED Provider Notes (Signed)
Medical screening examination/treatment/procedure(s) were performed by non-physician practitioner and as supervising physician I was immediately available for consultation/collaboration.  Leslee Home, M.D.  Reuben Likes, MD 06/05/13 [phone]

## 2013-06-05 NOTE — ED Notes (Signed)
Pt c/o poss UTI sxs onset 2 days... sxs include: LRQ abd pain, dysuria, urinary freq/urgency, hematuria... Denies: vag d/c, fevers, back pain... She is alert w/no signs of acute distress.

## 2013-06-05 NOTE — ED Provider Notes (Signed)
History    CSN: [id number] Arrival date & time 06/05/13  1652  None    Chief Complaint  Patient presents with  . Urinary Tract Infection   (Consider location/radiation/quality/duration/timing/severity/associated sxs/prior Treatment) HPI Comments: 27 year old female presents with urinary urgency, dysuria and frequency for 2 days. Denies fever, vomiting or back pain.  Past Medical History  Diagnosis Date  . Pregnancy induced hypertension    Past Surgical History  Procedure Laterality Date  . No past surgeries    . Dilation and evacuation N/A 02/21/2013    Procedure: DILATATION AND EVACUATION;  Surgeon: Reva Bores, MD;  Location: [facility];  Service: Gynecology;  Laterality: N/A;   Family History  Problem Relation Age of Onset  . Hypertension Mother    History  Substance Use Topics  . Smoking status: Current Every Day Smoker -- 0.25 packs/day    Types: Cigarettes  . Smokeless tobacco: Never Used  . Alcohol Use: No   OB History   Grav Para Term Preterm Abortions TAB SAB Ect Mult Living   4 4 3 1      4      Review of Systems  Constitutional: Negative.   Respiratory: Negative.   Cardiovascular: Negative.   Gastrointestinal: Negative.   Genitourinary:       See history of present illness  Musculoskeletal: Negative.     Allergies  Review of patient's allergies indicates no known allergies.  Home Medications   Current Outpatient Rx  Name  Route  Sig  Dispense  Refill  . cephALEXin (KEFLEX) 250 MG capsule   Oral   Take 1 capsule (250 mg total) by mouth 4 (four) times daily.   28 capsule   0   . HYDROcodone-acetaminophen (NORCO/VICODIN) 5-325 MG per tablet   Oral   Take 1 tablet by mouth every 4 (four) hours as needed for pain.   15 tablet   0    BP 171/112  Pulse 77  Temp(Src) 98.2 F (36.8 C) (Oral)  Resp 16  SpO2 100%  LMP 05/25/2013 Physical Exam  Nursing note and vitals reviewed. Constitutional: She is oriented to person, place, and time. She  appears well-developed and well-nourished. No distress.  Cardiovascular: Normal rate.   Pulmonary/Chest: Effort normal and breath sounds normal.  Abdominal: Soft. She exhibits no distension and no mass. There is no rebound and no guarding.  On routine exam of the abdomen and pelvis there is mild tenderness to the bilateral pelvis. Lesser to the suprapubic area. This is an incidental finding.  Neurological: She is alert and oriented to person, place, and time.  Skin: Skin is warm and dry. No rash noted.  Psychiatric: She has a normal mood and affect.    ED Course  Procedures (including critical care time) Labs Reviewed  POCT URINALYSIS DIP (DEVICE) - Abnormal; Notable for the following:    Hgb urine dipstick MODERATE (*)    Protein, ur 100 (*)    Leukocytes, UA MODERATE (*)    All other components within normal limits  URINE CULTURE  POCT PREGNANCY, URINE  URINE CYTOLOGY ANCILLARY ONLY   No results found. 1. UTI (lower urinary tract infection)     MDM  Keflex 250 mg 4 times a day for 7 days May obtain AZO  to take 3 times a day for urinary symptoms A urine culture is obtained as well as cervical cytology ancillary testing. Should he develop a vaginal discharge or increase in pelvic pain seek medical attention promptly. Drink  plenty of fluids to wash out for urinary tract infection.   Hayden Rasmussen, NP 06/05/13 [phone]

## 2013-06-09 LAB — URINE CULTURE: Colony Count: >=100000

## 2013-06-09 NOTE — ED Notes (Addendum)
Urine culture: >100,000 colonies staph species (coagulase neg.)  Pt. adequately treated with Keflex. GC, Chlamydia, Trich and Gardnerella neg. [name] 06/09/2013

## 2013-10-08 ENCOUNTER — Encounter (HOSPITAL_COMMUNITY): Payer: Self-pay | Admitting: Emergency Medicine

## 2013-10-08 ENCOUNTER — Emergency Department (HOSPITAL_COMMUNITY)
Admission: EM | Admit: 2013-10-08 | Discharge: 2013-10-08 | Disposition: A | Discharge status: Home / Self Care | Payer: Medicaid Other | Attending: Emergency Medicine | Admitting: Emergency Medicine

## 2013-10-08 DIAGNOSIS — F17200 Nicotine dependence, unspecified, uncomplicated: Secondary | ICD-10-CM | POA: Insufficient documentation

## 2013-10-08 DIAGNOSIS — R110 Nausea: Secondary | ICD-10-CM | POA: Insufficient documentation

## 2013-10-08 DIAGNOSIS — N760 Acute vaginitis: Secondary | ICD-10-CM | POA: Insufficient documentation

## 2013-10-08 DIAGNOSIS — B9689 Other specified bacterial agents as the cause of diseases classified elsewhere: Secondary | ICD-10-CM

## 2013-10-08 DIAGNOSIS — Z792 Long term (current) use of antibiotics: Secondary | ICD-10-CM | POA: Insufficient documentation

## 2013-10-08 DIAGNOSIS — H9209 Otalgia, unspecified ear: Secondary | ICD-10-CM | POA: Insufficient documentation

## 2013-10-08 DIAGNOSIS — Z3202 Encounter for pregnancy test, result negative: Secondary | ICD-10-CM | POA: Insufficient documentation

## 2013-10-08 LAB — URINALYSIS, ROUTINE W REFLEX MICROSCOPIC
Glucose, UA: NEGATIVE mg/dL
Hgb urine dipstick: NEGATIVE
Ketones, ur: NEGATIVE mg/dL
Leukocytes, UA: NEGATIVE
Nitrite: NEGATIVE
Protein, ur: NEGATIVE mg/dL
Specific Gravity, Urine: 1.025 (ref 1.005–1.030)
Urobilinogen, UA: 0.2 mg/dL (ref 0.0–1.0)
pH: 5.5 (ref 5.0–8.0)

## 2013-10-08 LAB — CBC WITH DIFFERENTIAL/PLATELET
Basophils Absolute: 0 10*3/uL (ref 0.0–0.1)
Basophils Relative: 1 % (ref 0–1)
Eosinophils Absolute: 0.1 10*3/uL (ref 0.0–0.7)
Eosinophils Relative: 3 % (ref 0–5)
HCT: 37.5 % (ref 36.0–46.0)
Hemoglobin: 12.3 g/dL (ref 12.0–15.0)
Lymphocytes Relative: 33 % (ref 12–46)
Lymphs Abs: 1.9 10*3/uL (ref 0.7–4.0)
MCH: 25.4 pg — ABNORMAL LOW (ref 26.0–34.0)
MCHC: 32.8 g/dL (ref 30.0–36.0)
MCV: 77.5 fL — ABNORMAL LOW (ref 78.0–100.0)
Monocytes Absolute: 0.5 10*3/uL (ref 0.1–1.0)
Monocytes Relative: 8 % (ref 3–12)
Neutro Abs: 3.2 10*3/uL (ref 1.7–7.7)
Neutrophils Relative %: 55 % (ref 43–77)
Platelets: 240 10*3/uL (ref 150–400)
RBC: 4.84 MIL/uL (ref 3.87–5.11)
RDW: 17.6 % — ABNORMAL HIGH (ref 11.5–15.5)
WBC: 5.7 10*3/uL (ref 4.0–10.5)

## 2013-10-08 LAB — POCT I-STAT, CHEM 8
BUN: 10 mg/dL (ref 6–23)
Calcium, Ion: 1.32 mmol/L — ABNORMAL HIGH (ref 1.12–1.23)
Chloride: 104 mEq/L (ref 96–112)
Creatinine, Ser: 0.9 mg/dL (ref 0.50–1.10)
Glucose, Bld: 79 mg/dL (ref 70–99)
HCT: 41 % (ref 36.0–46.0)
Hemoglobin: 13.9 g/dL (ref 12.0–15.0)
Potassium: 3.9 mEq/L (ref 3.5–5.1)
Sodium: 142 mEq/L (ref 135–145)
TCO2: 25 mmol/L (ref 0–100)

## 2013-10-08 LAB — WET PREP, GENITAL
Trich, Wet Prep: NONE SEEN
Yeast Wet Prep HPF POC: NONE SEEN

## 2013-10-08 LAB — PREGNANCY, URINE: Preg Test, Ur: NEGATIVE

## 2013-10-08 MED ORDER — ONDANSETRON HCL 4 MG PO TABS
4.0000 mg | ORAL_TABLET | Freq: Once | ORAL | Status: AC
  Administered 2013-10-08: 4 mg via ORAL
  Filled 2013-10-08: qty 1

## 2013-10-08 MED ORDER — METRONIDAZOLE 500 MG PO TABS: 500.0000 mg | ORAL_TABLET | Freq: Two times a day (BID) | ORAL | Status: DC

## 2013-10-08 NOTE — ED Notes (Signed)
Pt c/o right ear throbbing pain two days ago. No discharge or drainage from ear. Pt also c/o lower diffuse abdominal pain that started 3-4 days ago. C/o nausea without vomiting, and loss of apetite. States in her lower abdomen "it feels like theres a knot in there". Pain with palpation. Denies any problems with urinating, denies any unusual discharge. States LMP this month on the 16th.

## 2013-10-08 NOTE — ED Notes (Signed)
PT ambulated with baseline gait; VSS; A&Ox3; no signs of distress; respirations even and unlabored; skin warm and dry; no questions upon discharge.  

## 2013-10-08 NOTE — ED Notes (Signed)
Pt c/o right earache and abd pain with nausea

## 2013-10-08 NOTE — ED Provider Notes (Addendum)
CSN: [id number]     Arrival date & time 10/08/13  1144 History   First MD Initiated Contact with Patient 10/08/13 1147     Chief Complaint  Patient presents with  . Otalgia  . Abdominal Pain   (Consider location/radiation/quality/duration/timing/severity/associated sxs/prior Treatment) Patient is a 27 y.o. female presenting with abdominal pain. The history is provided by the patient. No language interpreter was used.  Abdominal Pain Pain location:  Suprapubic Pain quality: aching   Pain radiates to:  Does not radiate Pain severity:  Mild Onset quality:  Gradual Relieved by:  None tried Associated symptoms: nausea   Associated symptoms: no chills, no diarrhea, no dysuria, no fatigue, no fever and no vomiting   Nausea:    Severity:  Mild   Onset quality:  Gradual Risk factors: not obese and not pregnant    Pt is a G4, P4, well-appearing, 27 year old female who presents with right ear pain and lower abdominal pain. She reports that she has had cold symptoms over the recent few days with congestion and pain into her right ear. She denies fever, chills, vomiting and diarrhea. No recent known sick exposure or travel. She also reports that she is having suprapubic pain. She denies dysuria, hematuria or other urinary symptoms. She reports that her periods have been regular and her last one was about 2 weeks ago. She denies pain with intercourse, vaginal bleeding or discharge with odor.  Past Medical History  Diagnosis Date  . Pregnancy induced hypertension    Past Surgical History  Procedure Laterality Date  . No past surgeries    . Dilation and evacuation N/A 02/21/2013    Procedure: DILATATION AND EVACUATION;  Surgeon: Reva Bores, MD;  Location: [facility];  Service: Gynecology;  Laterality: N/A;   Family History  Problem Relation Age of Onset  . Hypertension Mother    History  Substance Use Topics  . Smoking status: Current Every Day Smoker -- 0.25 packs/day    Types: Cigarettes   . Smokeless tobacco: Never Used  . Alcohol Use: Yes     Comment: occ   OB History   Grav Para Term Preterm Abortions TAB SAB Ect Mult Living   4 4 3 1      4      Review of Systems  Constitutional: Negative for fever, chills and fatigue.  Gastrointestinal: Positive for nausea and abdominal pain. Negative for vomiting and diarrhea.  Genitourinary: Negative for dysuria.    Allergies  Review of patient's allergies indicates no known allergies.  Home Medications   Current Outpatient Rx  Name  Route  Sig  Dispense  Refill  . cephALEXin (KEFLEX) 250 MG capsule   Oral   Take 1 capsule (250 mg total) by mouth 4 (four) times daily.   28 capsule   0   . HYDROcodone-acetaminophen (NORCO/VICODIN) 5-325 MG per tablet   Oral   Take 1 tablet by mouth every 4 (four) hours as needed for pain.   15 tablet   0    BP 171/122  Pulse 64  Temp(Src) 97.9 F (36.6 C) (Oral)  Resp 18  Ht 5\' 7"  (1.702 m)  Wt 127 lb (57.607 kg)  BMI 19.89 kg/m2  SpO2 99% Physical Exam  Nursing note and vitals reviewed. Constitutional: She is oriented to person, place, and time. She appears well-developed and well-nourished. No distress.  HENT:  Head: Normocephalic and atraumatic.  Eyes: Conjunctivae are normal. Pupils are equal, round, and reactive to light.  Neck: Normal range of motion. Neck supple.  Cardiovascular: Normal rate, regular rhythm and normal heart sounds.   No murmur heard. Abdominal: Soft. Bowel sounds are normal. She exhibits no distension. There is tenderness in the suprapubic area.  Genitourinary: There is no rash, tenderness, lesion or injury on the right labia. There is no rash, tenderness, lesion or injury on the left labia. Cervix exhibits no motion tenderness and no friability. Right adnexum displays no mass, no tenderness and no fullness. Left adnexum displays no mass, no tenderness and no fullness. Vaginal discharge found.  Musculoskeletal: Normal range of motion.  Neurological:  She is alert and oriented to person, place, and time.  Skin: Skin is warm and dry.  Psychiatric: She has a normal mood and affect. Her behavior is normal. Judgment and thought content normal.    ED Course  Procedures (including critical care time) Labs Review Labs Reviewed - No data to display Imaging Review No results found.  EKG Interpretation   None       MDM   1. BV (bacterial vaginosis)    Whitish vaginal discharge with suprapubic tenderness. Afebrile and well-appearing. Abdominal exam benign, pelvic exam with no CMT tenderness or adnexal fullness. Has 73 month old baby, not breastfeeding. Metronidazole 500mg  bid x 7d. Will call for +culture results for Gc/Chlamydia. Cold symptoms a few days ago with right ear fullness. Right ear not infective, but has some clear fluid. Nasacort AQ or allergy meds as needed OTC. Plan discussed with pt and agreed.      Irish Elders, NP 10/08/13 1426  Irish Elders, NP 10/16/13 [phone]

## 2013-10-09 LAB — GC/CHLAMYDIA PROBE AMP
CT Probe RNA: NEGATIVE
GC Probe RNA: NEGATIVE

## 2013-10-09 NOTE — ED Provider Notes (Signed)
Medical screening examination/treatment/procedure(s) were performed by non-physician practitioner and as supervising physician I was immediately available for consultation/collaboration.  EKG Interpretation   None         Roney Marion, MD 10/09/13 1123

## 2013-10-19 NOTE — ED Provider Notes (Signed)
Medical screening examination/treatment/procedure(s) were performed by non-physician practitioner and as supervising physician I was immediately available for consultation/collaboration.  EKG Interpretation   None         Roney Marion, MD 10/19/13 [phone]

## 2014-06-10 IMAGING — US US TRANSVAGINAL NON-OB
1 series · 13 of 25 positions shown · non-contrast
Comparison: 02/20/2013

CLINICAL DATA: 2 weeks status post D&C for retained products of
conception.  1 week of left lower quadrant pain.  Postpartum with
delivery on 01/29/2013.



[Series 1: us pelvis complete · 13 of 114 slices shown]
[im 1/114]
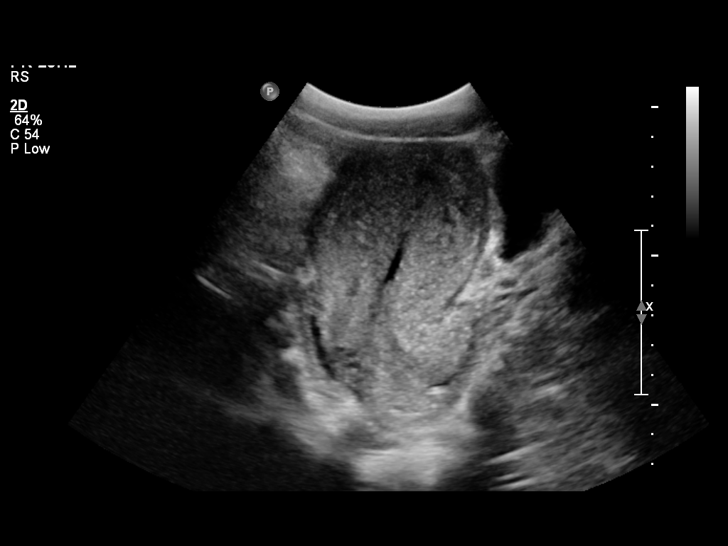
[im 10/114]
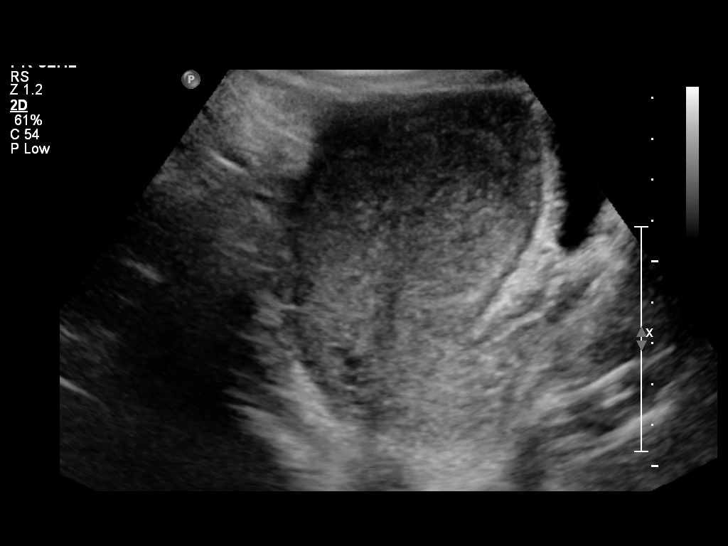
[im 19/114]
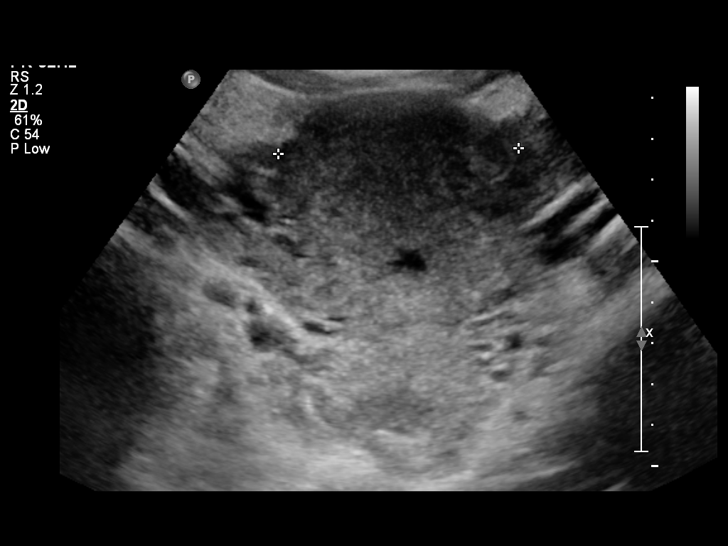
[im 29/114]
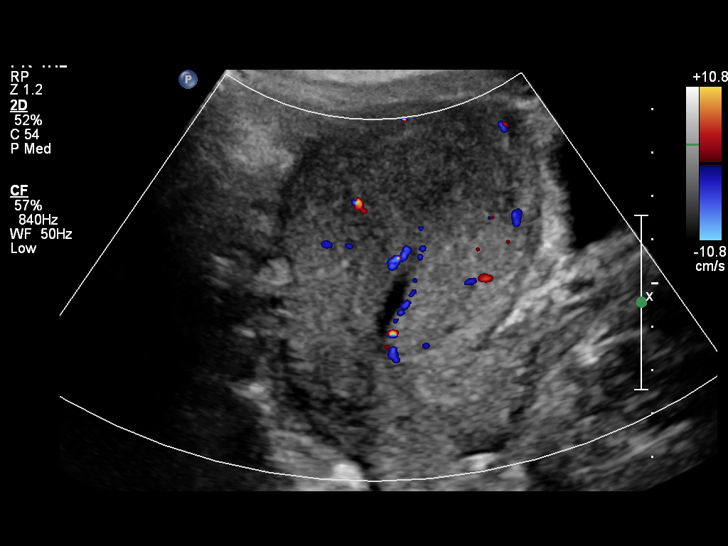
[im 38/114]
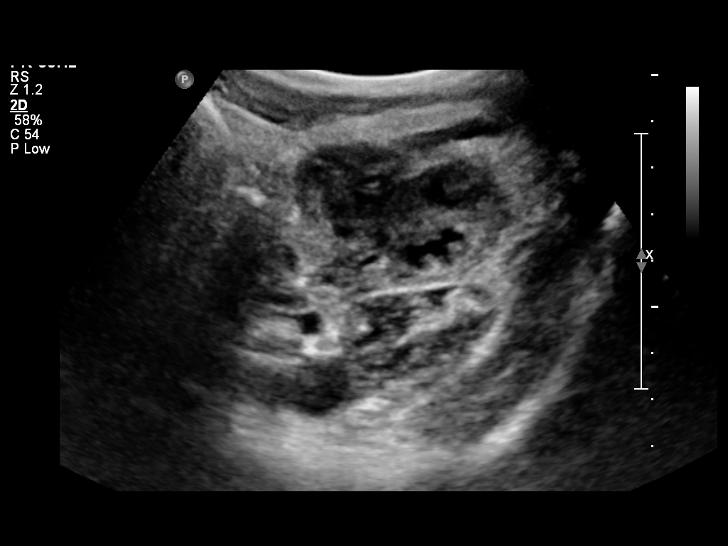
[im 48/114]
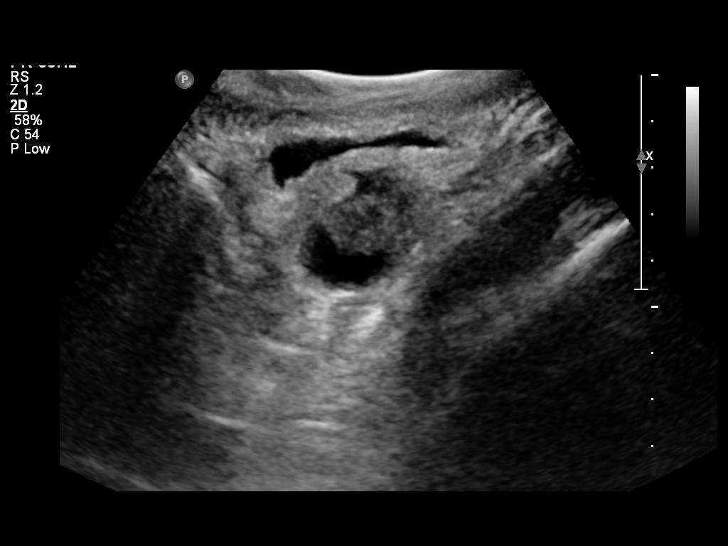
[im 57/114]
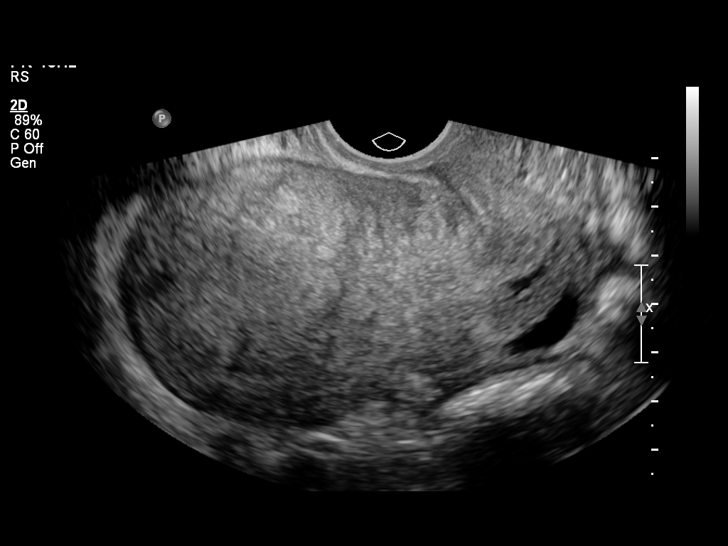
[im 66/114]
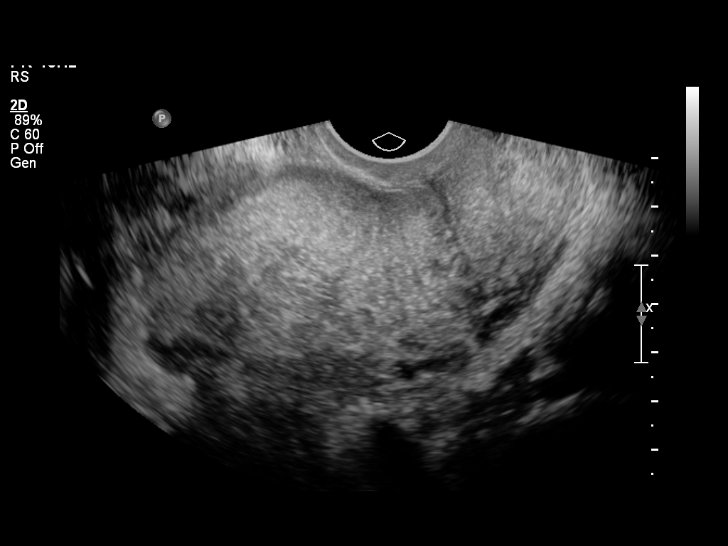
[im 76/114]
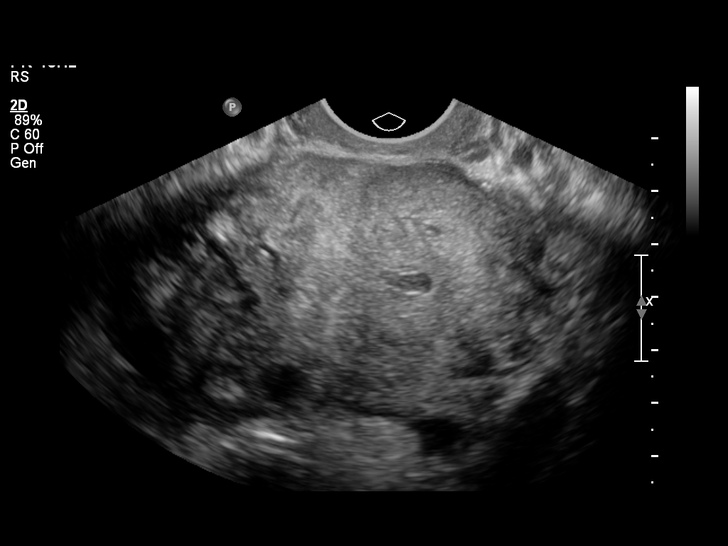
[im 85/114]
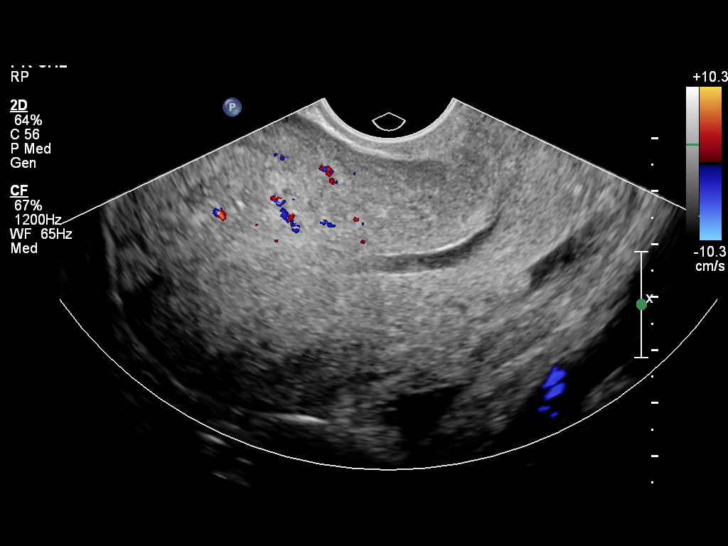
[im 95/114]
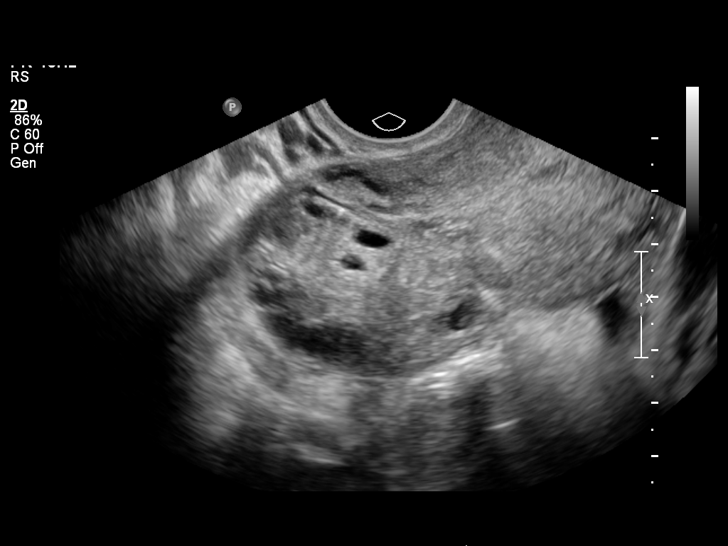
[im 104/114]
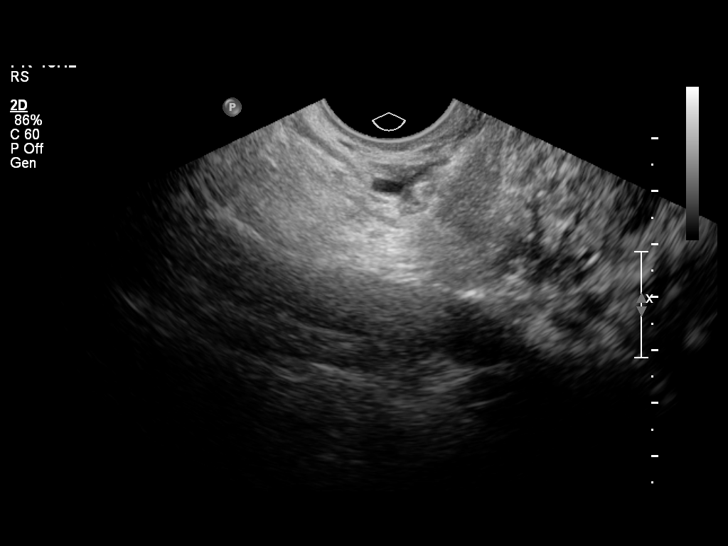
[im 114/114]
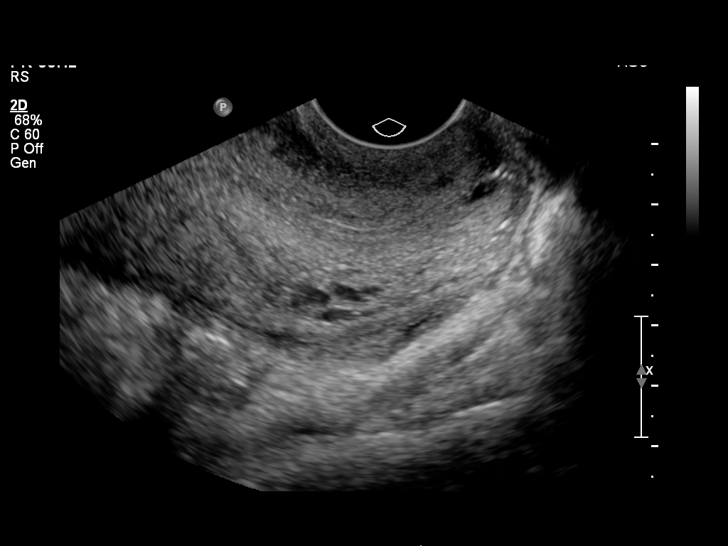

[13 of 25 positions shown; findings below may reference images not displayed]

FINDINGS: Uterus: Demonstrates a sagittal length of 7 cm, depth of 6 cm and
width of 6.5 cm compatible the patient's postpartum status.  A
homogeneous myometrium is noted

Endometrium: Appears thin with no evidence for retained products of
conception sonographically.  A small amount of fluid is seen within
the endometrial canal and no abnormal vascularity is identified
with color Doppler assessment

Right ovary:  Measures 3.0 x 2.2 x 4.25 cm and has a normal
appearance

Left ovary: Measures 2.5 x 1.6 by 2.0 cm and has a normal
appearance.

Other findings: Adjacent to the left ovary and best seen on cine
loop evaluation is a small serpiginous fluid filled area suspicious
for hydrosalpinx.  No pelvic fluid is noted.
IMPRESSION: No evidence for retained products of conception sonographically.

Question small left hydrosalpinx..

## 2014-06-15 ENCOUNTER — Encounter (HOSPITAL_COMMUNITY): Payer: Self-pay | Admitting: Emergency Medicine

## 2014-06-15 ENCOUNTER — Emergency Department (HOSPITAL_COMMUNITY)
Admission: EM | Admit: 2014-06-15 | Discharge: 2014-06-15 | Disposition: A | Discharge status: Home / Self Care | Payer: Medicaid Other | Attending: Emergency Medicine | Admitting: Emergency Medicine

## 2014-06-15 DIAGNOSIS — F17200 Nicotine dependence, unspecified, uncomplicated: Secondary | ICD-10-CM | POA: Insufficient documentation

## 2014-06-15 DIAGNOSIS — G4489 Other headache syndrome: Secondary | ICD-10-CM | POA: Diagnosis not present

## 2014-06-15 DIAGNOSIS — R51 Headache: Secondary | ICD-10-CM | POA: Diagnosis present

## 2014-06-15 MED ORDER — METOCLOPRAMIDE HCL 5 MG/ML IJ SOLN
10.0000 mg | Freq: Once | INTRAMUSCULAR | Status: AC
  Administered 2014-06-15: 10 mg via INTRAVENOUS
  Filled 2014-06-15: qty 2

## 2014-06-15 MED ORDER — KETOROLAC TROMETHAMINE 30 MG/ML IJ SOLN
30.0000 mg | Freq: Once | INTRAMUSCULAR | Status: AC
  Administered 2014-06-15: 30 mg via INTRAVENOUS
  Filled 2014-06-15: qty 1

## 2014-06-15 NOTE — ED Notes (Signed)
Per pt sts HA x 1 week. sts taking Excedrin but not helping.

## 2014-06-15 NOTE — ED Notes (Signed)
VS stable. NAD noted. Pt ambulatory in room. IV taken out. Pt given discharge instructions. All questions answered. Pt discharged home. Pt ambulatory on discharge.

## 2014-06-15 NOTE — ED Provider Notes (Signed)
CSN: [id number]     Arrival date & time 06/15/14  1215 History   First MD Initiated Contact with Patient 06/15/14 1226     Chief Complaint  Patient presents with  . Headache     (Consider location/radiation/quality/duration/timing/severity/associated sxs/prior Treatment) HPI Comments: 28 year old female presents to the emergency department complaining of a left-sided headache x1 week. Headache is constant, located behind her left eye a radiating along the left side to the posterior aspect of her head, sharp and throbbing. Headache is worse at night. She has been taking Excedrin with mild temporary relief of her symptoms, however throughout the week the Excedrin is working less. Admits to associated photophobia, phonophobia and nausea. States she has occasional blurred vision in the left eye. States she has had headaches in the past, however she has not had a headache similar to this. Denies fever, chills, neck pain or stiffness, confusion, speech changes, numbness or weakness, lightheadedness or dizziness. Her mom and grandmom have a history of migraines.  Patient is a 28 y.o. female presenting with headaches. The history is provided by the patient.  Headache Associated symptoms: nausea and photophobia     Past Medical History  Diagnosis Date  . Pregnancy induced hypertension    Past Surgical History  Procedure Laterality Date  . No past surgeries    . Dilation and evacuation N/A 02/21/2013    Procedure: DILATATION AND EVACUATION;  Surgeon: Reva Boresanya S Pratt, MD;  Location: [facility];  Service: Gynecology;  Laterality: N/A;   Family History  Problem Relation Age of Onset  . Hypertension Mother    History  Substance Use Topics  . Smoking status: Current Every Day Smoker -- 0.25 packs/day    Types: Cigarettes  . Smokeless tobacco: Never Used  . Alcohol Use: Yes     Comment: occ   OB History   Grav Para Term Preterm Abortions TAB SAB Ect Mult Living   4 4 3 1      4      Review of  Systems  Eyes: Positive for photophobia and visual disturbance.  Gastrointestinal: Positive for nausea.  Neurological: Positive for headaches.  All other systems reviewed and are negative.     Allergies  Review of patient's allergies indicates no known allergies.  Home Medications   Prior to Admission medications   Medication Sig Start Date End Date Taking? Authorizing Provider  aspirin-acetaminophen-caffeine (EXCEDRIN MIGRAINE) [id number] MG per tablet Take 1 tablet by mouth every 6 (six) hours as needed for headache.   Yes Historical Provider, MD   BP 132/84  Pulse 86  Temp(Src) 98.4 F (36.9 C) (Oral)  Resp 20  SpO2 100%  LMP 06/07/2014 Physical Exam  Nursing note and vitals reviewed. Constitutional: She is oriented to person, place, and time. She appears well-developed and well-nourished. No distress.  HENT:  Head: Normocephalic and atraumatic.  Mouth/Throat: Oropharynx is clear and moist.  Eyes: Conjunctivae and EOM are normal. Pupils are equal, round, and reactive to light.  Neck: Normal range of motion. Neck supple.  Cardiovascular: Normal rate, regular rhythm, normal heart sounds and intact distal pulses.   No extremity edema.  Pulmonary/Chest: Effort normal and breath sounds normal. No respiratory distress.  Abdominal: Soft. Bowel sounds are normal. There is no tenderness.  Musculoskeletal: Normal range of motion. She exhibits no edema.  Neurological: She is alert and oriented to person, place, and time. She has normal strength. No sensory deficit. She displays a negative Romberg sign. Coordination and gait normal. GCS eye  subscore is 4. GCS verbal subscore is 5. GCS motor subscore is 6.  Speech fluent, goal oriented. Moves limbs without ataxia. Equal grip strength bilateral.  Skin: Skin is warm and dry. She is not diaphoretic.  Psychiatric: She has a normal mood and affect. Her behavior is normal.    ED Course  Procedures (including critical care time) Labs  Review Labs Reviewed - No data to display  Imaging Review No results found.   EKG Interpretation None      MDM   Final diagnoses:  Other headache syndrome   Patient presenting with left-sided headache with associated photophobia, phonophobia and nausea. She is well appearing and in no apparent distress. Laying on exam bed with the light off in the room. Afebrile, vital signs stable. No focal neurologic deficits. Doubt acute intracranial abnormality. Plan to give migraine cocktail and re-assess. 1:38 PM Pt states her headache completely subsided, came out of the exam room to nurse and requested to leave. Pt stable for d/c. Return precautions given. Patient states understanding of treatment care plan and is agreeable.  Trevor MaceRobyn M Albert, PA-C 06/15/14 1338

## 2014-06-15 NOTE — ED Provider Notes (Signed)
Medical screening examination/treatment/procedure(s) were performed by non-physician practitioner and as supervising physician I was immediately available for consultation/collaboration.   EKG Interpretation None        Lyanne CoKevin M Dinna Severs, MD 06/15/14 1610

## 2014-07-30 ENCOUNTER — Emergency Department (HOSPITAL_COMMUNITY)
Admission: EM | Admit: 2014-07-30 | Discharge: 2014-07-30 | Disposition: A | Discharge status: Home / Self Care | Payer: Medicaid Other

## 2014-07-30 NOTE — ED Notes (Signed)
Pt's name called to be triaged no answer pager found in waiting room

## 2014-07-30 NOTE — ED Notes (Signed)
Pt's name called again for triage no answer

## 2014-07-31 ENCOUNTER — Emergency Department (HOSPITAL_COMMUNITY): Payer: Medicaid Other

## 2014-07-31 ENCOUNTER — Emergency Department (HOSPITAL_COMMUNITY)
Admission: EM | Admit: 2014-07-31 | Discharge: 2014-07-31 | Disposition: A | Discharge status: Home / Self Care | Payer: Medicaid Other | Attending: Emergency Medicine | Admitting: Emergency Medicine

## 2014-07-31 ENCOUNTER — Encounter (HOSPITAL_COMMUNITY): Payer: Self-pay | Admitting: Emergency Medicine

## 2014-07-31 DIAGNOSIS — R011 Cardiac murmur, unspecified: Secondary | ICD-10-CM | POA: Insufficient documentation

## 2014-07-31 DIAGNOSIS — R1903 Right lower quadrant abdominal swelling, mass and lump: Secondary | ICD-10-CM | POA: Diagnosis present

## 2014-07-31 DIAGNOSIS — F17200 Nicotine dependence, unspecified, uncomplicated: Secondary | ICD-10-CM | POA: Diagnosis not present

## 2014-07-31 DIAGNOSIS — Z3202 Encounter for pregnancy test, result negative: Secondary | ICD-10-CM | POA: Insufficient documentation

## 2014-07-31 DIAGNOSIS — N83202 Unspecified ovarian cyst, left side: Secondary | ICD-10-CM

## 2014-07-31 DIAGNOSIS — N83209 Unspecified ovarian cyst, unspecified side: Secondary | ICD-10-CM | POA: Diagnosis not present

## 2014-07-31 LAB — CBC WITH DIFFERENTIAL/PLATELET
Basophils Absolute: 0 10*3/uL (ref 0.0–0.1)
Basophils Relative: 1 % (ref 0–1)
Eosinophils Absolute: 0.3 10*3/uL (ref 0.0–0.7)
Eosinophils Relative: 7 % — ABNORMAL HIGH (ref 0–5)
HCT: 39.6 % (ref 36.0–46.0)
Hemoglobin: 12.7 g/dL (ref 12.0–15.0)
Lymphocytes Relative: 35 % (ref 12–46)
Lymphs Abs: 1.5 10*3/uL (ref 0.7–4.0)
MCH: 26.5 pg (ref 26.0–34.0)
MCHC: 32.1 g/dL (ref 30.0–36.0)
MCV: 82.7 fL (ref 78.0–100.0)
Monocytes Absolute: 0.4 10*3/uL (ref 0.1–1.0)
Monocytes Relative: 10 % (ref 3–12)
Neutro Abs: 2 10*3/uL (ref 1.7–7.7)
Neutrophils Relative %: 47 % (ref 43–77)
Platelets: 193 10*3/uL (ref 150–400)
RBC: 4.79 MIL/uL (ref 3.87–5.11)
RDW: 14.2 % (ref 11.5–15.5)
WBC: 4.2 10*3/uL (ref 4.0–10.5)

## 2014-07-31 LAB — URINALYSIS, ROUTINE W REFLEX MICROSCOPIC
Bilirubin Urine: NEGATIVE
Glucose, UA: NEGATIVE mg/dL
Hgb urine dipstick: NEGATIVE
Ketones, ur: NEGATIVE mg/dL
Leukocytes, UA: NEGATIVE
Nitrite: NEGATIVE
Protein, ur: NEGATIVE mg/dL
Specific Gravity, Urine: 1.014 (ref 1.005–1.030)
Urobilinogen, UA: 0.2 mg/dL (ref 0.0–1.0)
pH: 7 (ref 5.0–8.0)

## 2014-07-31 LAB — PREGNANCY, URINE: Preg Test, Ur: NEGATIVE

## 2014-07-31 LAB — BASIC METABOLIC PANEL
Anion gap: 10 (ref 5–15)
BUN: 12 mg/dL (ref 6–23)
CO2: 23 mEq/L (ref 19–32)
Calcium: 9.1 mg/dL (ref 8.4–10.5)
Chloride: 104 mEq/L (ref 96–112)
Creatinine, Ser: 0.71 mg/dL (ref 0.50–1.10)
GFR calc Af Amer: >90 mL/min (ref >90)
GFR calc non Af Amer: >90 mL/min (ref >90)
Glucose, Bld: 87 mg/dL (ref 70–99)
Potassium: 4.6 mEq/L (ref 3.7–5.3)
Sodium: 137 mEq/L (ref 137–147)

## 2014-07-31 LAB — WET PREP, GENITAL
Trich, Wet Prep: NONE SEEN
WBC, Wet Prep HPF POC: NONE SEEN
Yeast Wet Prep HPF POC: NONE SEEN

## 2014-07-31 MED ORDER — OXYCODONE-ACETAMINOPHEN 5-325 MG PO TABS
1.0000 | ORAL_TABLET | Freq: Once | ORAL | Status: AC
  Administered 2014-07-31: 1 via ORAL
  Filled 2014-07-31: qty 1

## 2014-07-31 MED ORDER — HYDROCODONE-ACETAMINOPHEN 5-325 MG PO TABS: 1.0000 | ORAL_TABLET | ORAL | Status: DC | PRN

## 2014-07-31 MED ORDER — IOHEXOL 300 MG/ML  SOLN
100.0000 mL | Freq: Once | INTRAMUSCULAR | Status: AC | PRN
  Administered 2014-07-31: 100 mL via INTRAVENOUS

## 2014-07-31 MED ORDER — IOHEXOL 300 MG/ML  SOLN
50.0000 mL | Freq: Once | INTRAMUSCULAR | Status: AC | PRN
  Administered 2014-07-31: 50 mL via ORAL

## 2014-07-31 NOTE — ED Notes (Signed)
Pt reports that she does not want to stay for CT because she has to leave for personal reasons. Pt says that she will come back later. PA made aware and he is speaking at the bedside with patient at this time.

## 2014-07-31 NOTE — ED Notes (Signed)
Patient found lump on left lower abdominal area about three days ago.  Area is painful, and pain worsened yesterday.  Denies nausea, vomiting, decreased appetite.  Bowel movements normal and she had one yesterday.  No history of heavy lifting or trauma.

## 2014-07-31 NOTE — ED Notes (Signed)
She states she has left groin area pain, and whenever she lies down, she can feel a "bump" there.  She is in no distress.

## 2014-07-31 NOTE — ED Notes (Signed)
Pt at ultrasound

## 2014-07-31 NOTE — ED Provider Notes (Signed)
CSN: [id number]     Arrival date & time 07/31/14  1147 History   First MD Initiated Contact with Patient 07/31/14 1228     Chief Complaint  Patient presents with  . Groin Swelling     (Consider location/radiation/quality/duration/timing/severity/associated sxs/prior Treatment) HPI Angela Manning is a 28 year old female G4P4 with past medical history of pregnancy-induced hypertension who presents to the ER today complaining of left lower abdominal pain. Patient states her pain is a sharp, intermittent pain which is exacerbated by movement, or lifting objects. Patient states her pain was acute in onset approximately 3 nights ago and woke her up from sleep. Patient states the pain does not radiate and there are no alleviating factors for it. She denies any associated nausea, vomiting, diarrhea, vaginal pain, vaginal discharge, vaginal bleeding, chest pain, shortness of breath, dizziness, weakness, fever.  Past Medical History  Diagnosis Date  . Pregnancy induced hypertension    Past Surgical History  Procedure Laterality Date  . No past surgeries    . Dilation and evacuation N/A 02/21/2013    Procedure: DILATATION AND EVACUATION;  Surgeon: Reva Bores, MD;  Location: [facility];  Service: Gynecology;  Laterality: N/A;   Family History  Problem Relation Age of Onset  . Hypertension Mother    History  Substance Use Topics  . Smoking status: Current Every Day Smoker -- 0.25 packs/day    Types: Cigarettes  . Smokeless tobacco: Never Used  . Alcohol Use: Yes     Comment: occ   OB History   Grav Para Term Preterm Abortions TAB SAB Ect Mult Living   Review of Systems  Constitutional: Negative for fever.  HENT: Negative for trouble swallowing.   Eyes: Negative for visual disturbance.  Respiratory: Negative for shortness of breath.   Cardiovascular: Negative for chest pain.  Gastrointestinal: Positive for abdominal pain. Negative for nausea and vomiting.   Genitourinary: Negative for dysuria, hematuria, vaginal bleeding, vaginal discharge and vaginal pain.  Musculoskeletal: Negative for neck pain.  Skin: Negative for rash.  Neurological: Negative for dizziness, weakness and numbness.  Psychiatric/Behavioral: Negative.       Allergies  Review of patient's allergies indicates no known allergies.  Home Medications   Prior to Admission medications   Medication Sig Start Date End Date Taking? Authorizing Provider  naproxen sodium (ANAPROX) 220 MG tablet Take 220 mg by mouth as needed (pain).   Yes Historical Provider, MD   BP 166/94  Pulse 61  Temp(Src) 98.4 F (36.9 C) (Oral)  Resp 14  SpO2 99%  LMP 07/08/2014 Physical Exam  Constitutional: She is oriented to person, place, and time. She appears well-developed and well-nourished. No distress.  HENT:  Head: Normocephalic and atraumatic.  Mouth/Throat: Oropharynx is clear and moist. No oropharyngeal exudate.  Eyes: Right eye exhibits no discharge. Left eye exhibits no discharge. No scleral icterus.  Neck: Normal range of motion.  Cardiovascular: Normal rate, regular rhythm and S2 normal.   Murmur heard.  Systolic murmur is present with a grade of 1/6  Pulmonary/Chest: Effort normal and breath sounds normal. No accessory muscle usage. Not tachypneic. No respiratory distress.  Abdominal: Soft. Bowel sounds are normal. She exhibits no distension. There is tenderness in the right lower quadrant. There is no rigidity, no guarding, no tenderness at McBurney's point and negative Murphy's sign.  Genitourinary: Vagina normal and uterus normal. There is no rash, tenderness, lesion or injury on the  right labia. There is no rash, tenderness, lesion or injury on the left labia. Uterus is not tender. Right adnexum displays no mass, no tenderness and no fullness. Left adnexum displays no mass, no tenderness and no fullness. No erythema, tenderness or bleeding around the vagina. No foreign body around  the vagina. No signs of injury around the vagina. No vaginal discharge found.  Pelvic exam shows no rash, erythema around the external or internal labia. Vaginal walls intact. Mild white colored discharge noted around cervix. Bimanual exam was unremarkable for any adnexal tenderness. Patient experienced no discomfort during entire exam. Chaperone was present during entire pelvic exam.  Musculoskeletal: Normal range of motion. She exhibits no edema and no tenderness.  Neurological: She is alert and oriented to person, place, and time. No cranial nerve deficit. Coordination normal.  Skin: Skin is warm and dry. No rash noted. She is not diaphoretic.  Psychiatric: She has a normal mood and affect.    ED Course  Procedures (including critical care time) Labs Review Labs Reviewed  WET PREP, GENITAL - Abnormal; Notable for the following:    Clue Cells Wet Prep HPF POC FEW (*)    All other components within normal limits  CBC WITH DIFFERENTIAL - Abnormal; Notable for the following:    Eosinophils Relative 7 (*)    All other components within normal limits  URINALYSIS, ROUTINE W REFLEX MICROSCOPIC - Abnormal; Notable for the following:    APPearance CLOUDY (*)    All other components within normal limits  GC/CHLAMYDIA PROBE AMP  BASIC METABOLIC PANEL  PREGNANCY, URINE    Imaging Review    EKG Interpretation None      MDM   Final diagnoses:  None    28 year old female with past medical history of pregnancy-induced hypertension presenting with 2-3 days of a "knot in her left groin". Abdominal exam reveals small mass in left lower abdomen. No fever, chills, nausea, vomiting, diarrhea, vaginal pain, vaginal discharge, bleeding , dysuria. Workup to include CBC, BMP, UA, urine pregnancy, pelvic exam with GC Chlamydia, wet prep.   2:57 PM: Pelvic exam benign with no adnexal tenderness.  Due to the location of patient's mass noted on her abdominal exam, we'll send patient for a with  contrast CT scan of her abdomen and pelvis.  4:00 PM: Patient discussed and signed out to Angela Sites, PA-C with plan to followup on the CT results and order abdominal and pelvic US based on the results of the CT.    Signed,  Angela Mow, PA-C 5:51 PM   This patient seen and discussed with Dr. Toy Cookey, M.D.   Monte Fantasia, PA-C 07/31/14 1753

## 2014-07-31 NOTE — ED Provider Notes (Signed)
Pt received in sign out from PA [place] at shift change.  28 y.o. F with left lower abdominal pain x 3 days, sudden onset.  Pelvic exam with some discharged noted, no adnexal or CMT.    Plan:  Ct pending. Follow results and dispo accordingly.  Results for orders placed during the hospital encounter of 07/31/14  WET PREP, GENITAL      Result Value Ref Range   Yeast Wet Prep HPF POC NONE SEEN  NONE SEEN   Trich, Wet Prep NONE SEEN  NONE SEEN   Clue Cells Wet Prep HPF POC FEW (*) NONE SEEN   WBC, Wet Prep HPF POC NONE SEEN  NONE SEEN  CBC WITH DIFFERENTIAL      Result Value Ref Range   WBC 4.2  4.0 - 10.5 K/uL   RBC 4.79  3.87 - 5.11 MIL/uL   Hemoglobin 12.7  12.0 - 15.0 g/dL   HCT 40.9  81.1 - 91.4 %   MCV 82.7  78.0 - 100.0 fL   MCH 26.5  26.0 - 34.0 pg   MCHC 32.1  30.0 - 36.0 g/dL   RDW 78.2  95.6 - 21.3 %   Platelets 193  150 - 400 K/uL   Neutrophils Relative % 47  43 - 77 %   Neutro Abs 2.0  1.7 - 7.7 K/uL   Lymphocytes Relative 35  12 - 46 %   Lymphs Abs 1.5  0.7 - 4.0 K/uL   Monocytes Relative 10  3 - 12 %   Monocytes Absolute 0.4  0.1 - 1.0 K/uL   Eosinophils Relative 7 (*) 0 - 5 %   Eosinophils Absolute 0.3  0.0 - 0.7 K/uL   Basophils Relative 1  0 - 1 %   Basophils Absolute 0.0  0.0 - 0.1 K/uL  BASIC METABOLIC PANEL      Result Value Ref Range   Sodium 137  137 - 147 mEq/L   Potassium 4.6  3.7 - 5.3 mEq/L   Chloride 104  96 - 112 mEq/L   CO2 23  19 - 32 mEq/L   Glucose, Bld 87  70 - 99 mg/dL   BUN 12  6 - 23 mg/dL   Creatinine, Ser 0.86  0.50 - 1.10 mg/dL   Calcium 9.1  8.4 - 57.8 mg/dL   GFR calc non Af Amer >90  >90 mL/min   GFR calc Af Amer >90  >90 mL/min   Anion gap 10  5 - 15  URINALYSIS, ROUTINE W REFLEX MICROSCOPIC      Result Value Ref Range   Color, Urine YELLOW  YELLOW   APPearance CLOUDY (*) CLEAR   Specific Gravity, Urine 1.014  1.005 - 1.030   pH 7.0  5.0 - 8.0   Glucose, UA NEGATIVE  NEGATIVE mg/dL   Hgb urine dipstick NEGATIVE  NEGATIVE    Bilirubin Urine NEGATIVE  NEGATIVE   Ketones, ur NEGATIVE  NEGATIVE mg/dL   Protein, ur NEGATIVE  NEGATIVE mg/dL   Urobilinogen, UA 0.2  0.0 - 1.0 mg/dL   Nitrite NEGATIVE  NEGATIVE   Leukocytes, UA NEGATIVE  NEGATIVE  PREGNANCY, URINE      Result Value Ref Range   Preg Test, Ur NEGATIVE  NEGATIVE   US Transvaginal Non-ob  07/31/2014   CLINICAL DATA:  Large left ovarian cyst on CT.  EXAM: TRANSABDOMINAL AND TRANSVAGINAL ULTRASOUND OF PELVIS  DOPPLER ULTRASOUND OF OVARIES  TECHNIQUE: Both transabdominal and transvaginal ultrasound examinations of the  pelvis were performed. Transabdominal technique was performed for global imaging of the pelvis including uterus, ovaries, adnexal regions, and pelvic cul-de-sac.  It was necessary to proceed with endovaginal exam following the transabdominal exam to visualize the ovaries. Color and duplex Doppler ultrasound was utilized to evaluate blood flow to the ovaries.  COMPARISON:  None.  FINDINGS: Uterus  Measurements: 10.2 x 5.3 x 6.0 cm. No fibroids or other mass visualized.  Endometrium  Thickness: 11 mm.  No focal abnormality visualized.  Right ovary  Measurements: 2.9 x 2.3 x 2.0 cm. Normal appearance/no adnexal mass.  Left ovary  Measurements: 5.1 x 4.4 x 5.4 cm. Cyst in the left ovary measures 3.8 x 3.4 x 4.3 cm. This has a lace-like, reticulated internal appearance most likely representing a hemorrhagic cyst.  Pulsed Doppler evaluation of both ovaries demonstrates normal low-resistance arterial and venous waveforms.  Other findings  Small amount of free fluid noted within the pelvis.  IMPRESSION: 1. No evidence for ovarian torsion. 2. Probable left ovarian hemorrhagic cyst. Short-interval follow up ultrasound in 6-12 weeks is recommended, preferably during the week following the patient's normal menses. 3. Small amount of free fluid in the pelvis may be physiologic in a premenopausal female.   Electronically Signed   By: Signa Kell M.D.   On: 07/31/2014  20:02   US Pelvis Complete  07/31/2014   CLINICAL DATA:  Large left ovarian cyst on CT.  EXAM: TRANSABDOMINAL AND TRANSVAGINAL ULTRASOUND OF PELVIS  DOPPLER ULTRASOUND OF OVARIES  TECHNIQUE: Both transabdominal and transvaginal ultrasound examinations of the pelvis were performed. Transabdominal technique was performed for global imaging of the pelvis including uterus, ovaries, adnexal regions, and pelvic cul-de-sac.  It was necessary to proceed with endovaginal exam following the transabdominal exam to visualize the ovaries. Color and duplex Doppler ultrasound was utilized to evaluate blood flow to the ovaries.  COMPARISON:  None.  FINDINGS: Uterus  Measurements: 10.2 x 5.3 x 6.0 cm. No fibroids or other mass visualized.  Endometrium  Thickness: 11 mm.  No focal abnormality visualized.  Right ovary  Measurements: 2.9 x 2.3 x 2.0 cm. Normal appearance/no adnexal mass.  Left ovary  Measurements: 5.1 x 4.4 x 5.4 cm. Cyst in the left ovary measures 3.8 x 3.4 x 4.3 cm. This has a lace-like, reticulated internal appearance most likely representing a hemorrhagic cyst.  Pulsed Doppler evaluation of both ovaries demonstrates normal low-resistance arterial and venous waveforms.  Other findings  Small amount of free fluid noted within the pelvis.  IMPRESSION: 1. No evidence for ovarian torsion. 2. Probable left ovarian hemorrhagic cyst. Short-interval follow up ultrasound in 6-12 weeks is recommended, preferably during the week following the patient's normal menses. 3. Small amount of free fluid in the pelvis may be physiologic in a premenopausal female.   Electronically Signed   By: Signa Kell M.D.   On: 07/31/2014 20:02   Ct Abdomen Pelvis W Contrast  07/31/2014   CLINICAL DATA:  Left groin pain.  EXAM: CT ABDOMEN AND PELVIS WITH CONTRAST  TECHNIQUE: Multidetector CT imaging of the abdomen and pelvis was performed using the standard protocol following bolus administration of intravenous contrast.  CONTRAST:  50mL  OMNIPAQUE IOHEXOL 300 MG/ML SOLN, OMNIPAQUE IOHEXOL 300 MG/ML SOLN  COMPARISON:  Limited correlation is made with a chest CT 05/19/2013 and pelvic ultrasound 03/05/2013.  FINDINGS: A metallic BB was placed over the patient's palpable concern in the left lower anterior pelvic region.  Lung bases: Clear. No significant pleural or  pericardial effusion.  Liver/Biliary/Pancreas: There is ill-defined low-density anteriorly in the dome of the left hepatic lobe which is most obvious on coronal image 20. This is not well seen on the prior chest CT, although the liver had not yet enhanced on that examination. This measures approximately 2.3 cm and could reflect focal fat or possibly a hemangioma. 1.5 cm low-density lesion inferiorly in the left lobe on image 25 is a typical location for focal fat. Within the mid right hepatic lobe, there is a 2.0 x 1.3 cm lesion on image 19 which is subjectively low in density and probably a cyst. This does measure approximately 42 HU. This was not previously imaged. No evidence of gallstones, gallbladder wall thickening or biliary dilatation. The pancreas appears normal.  Spleen/Adrenal glands: Unremarkable.  Kidneys/Ureters/Bladder: Both kidneys appear normal. There is no hydronephrosis or evidence of urinary tract calculus. The bladder appears normal.  Bowel/Peritoneum: The stomach, small bowel, appendix and colon demonstrate no significant findings. No ascites or peritoneal nodularity.  Retroperitoneum/Pelvis: There are no enlarged abdominal or pelvic lymph nodes. There is no significant atherosclerosis. The uterus and right ovary appear normal. There is a 4.9 x 4.1 cm left adnexal mass which has a slightly thickened wall. There is mild stranding within the adjacent fat. This appears to involve the left ovary judging by the location of the ovarian veins. This corresponds with the palpable concern. There are a few prominent parametrial vessels in the lower pelvis.  Abdominal wall: No  abdominal wall masses or hernias. The inguinal regions appear unremarkable.  Musculoskeletal: No acute or significant osseus findings.  IMPRESSION: 1. The patient appears to be feeling a left adnexal cystic lesion which has a slightly complex appearance by CT. This was not present on the prior ultrasound. Correlation with pelvic ultrasound should be considered, especially if there is any clinical concern of ovarian torsion. 2. No evidence of abdominal wall mass or hernia. 3. Indeterminate liver lesions, not previously visualized, but likely incidental benign lesions.   Electronically Signed   By: Roxy Horseman M.D.   On: 07/31/2014 17:07   [place] Art/ven Flow Abd Pelv Doppler  07/31/2014   CLINICAL DATA:  Large left ovarian cyst on CT.  EXAM: TRANSABDOMINAL AND TRANSVAGINAL ULTRASOUND OF PELVIS  DOPPLER ULTRASOUND OF OVARIES  TECHNIQUE: Both transabdominal and transvaginal ultrasound examinations of the pelvis were performed. Transabdominal technique was performed for global imaging of the pelvis including uterus, ovaries, adnexal regions, and pelvic cul-de-sac.  It was necessary to proceed with endovaginal exam following the transabdominal exam to visualize the ovaries. Color and duplex Doppler ultrasound was utilized to evaluate blood flow to the ovaries.  COMPARISON:  None.  FINDINGS: Uterus  Measurements: 10.2 x 5.3 x 6.0 cm. No fibroids or other mass visualized.  Endometrium  Thickness: 11 mm.  No focal abnormality visualized.  Right ovary  Measurements: 2.9 x 2.3 x 2.0 cm. Normal appearance/no adnexal mass.  Left ovary  Measurements: 5.1 x 4.4 x 5.4 cm. Cyst in the left ovary measures 3.8 x 3.4 x 4.3 cm. This has a lace-like, reticulated internal appearance most likely representing a hemorrhagic cyst.  Pulsed Doppler evaluation of both ovaries demonstrates normal low-resistance arterial and venous waveforms.  Other findings  Small amount of free fluid noted within the pelvis.  IMPRESSION: 1. No evidence for  ovarian torsion. 2. Probable left ovarian hemorrhagic cyst. Short-interval follow up ultrasound in 6-12 weeks is recommended, preferably during the week following the patient's normal menses. 3. Small amount  of free fluid in the pelvis may be physiologic in a premenopausal female.   Electronically Signed   By: Signa Kell M.D.   On: 07/31/2014 20:02    CT revealed large left ovarian cyst, appears complex.  Given size, concern for potential ovarian torsion thus u/s was obtained which is reassuring. Patient's pain has been adequately controlled with PO percocet.  She will be discharged home with close OB-GYN follow-up.  vicodin for pain control.  Discussed plan with patient, he/she acknowledged understanding and agreed with plan of care.  Return precautions given for new or worsening symptoms.  Garlon Hatchet, PA-C 07/31/14 2205

## 2014-07-31 NOTE — Discharge Instructions (Signed)
Take the prescribed medication as directed. Follow-up with [facility] [facility]-- call and schedule appt.  You need a repeat ultrasound in 6-8 weeks to monitor ovarian cysts closely. Return to the ED for new or worsening symptoms.

## 2014-08-02 LAB — GC/CHLAMYDIA PROBE AMP
CT Probe RNA: NEGATIVE
GC Probe RNA: NEGATIVE

## 2014-08-02 NOTE — ED Provider Notes (Signed)
Medical screening examination/treatment/procedure(s) were conducted as a shared visit with non-physician practitioner(s) and myself.  I personally evaluated the patient during the encounter.   EKG Interpretation None      Pt presents w/ painful, palpable LLQ mass.  Cardiopulm exam benign, in NAD. CT ab/pelvis ordered and care transferred to oncoming team.   Toy Cookey, MD 08/02/14 1147

## 2014-08-03 NOTE — ED Provider Notes (Signed)
Medical screening examination/treatment/procedure(s) were performed by non-physician practitioner and as supervising physician I was immediately available for consultation/collaboration.   EKG Interpretation None        Richardean Canal, MD 08/03/14 [phone]

## 2014-09-09 ENCOUNTER — Encounter: Payer: Self-pay | Admitting: Obstetrics & Gynecology

## 2014-09-09 ENCOUNTER — Ambulatory Visit (INDEPENDENT_AMBULATORY_CARE_PROVIDER_SITE_OTHER): Payer: Medicaid Other | Admitting: Obstetrics & Gynecology

## 2014-09-09 VITALS — BP 142/79 | HR 86 | Ht 66.0 in | Wt 127.3 lb

## 2014-09-09 DIAGNOSIS — N8320 Unspecified ovarian cysts: Secondary | ICD-10-CM

## 2014-09-09 DIAGNOSIS — N83202 Unspecified ovarian cyst, left side: Secondary | ICD-10-CM

## 2014-09-09 MED ORDER — NORGESTIMATE-ETH ESTRADIOL 0.25-35 MG-MCG PO TABS: 1.0000 | ORAL_TABLET | Freq: Every day | ORAL | Status: DC

## 2014-09-09 MED ORDER — TRAMADOL HCL 50 MG PO TABS: 50.0000 mg | ORAL_TABLET | Freq: Four times a day (QID) | ORAL | Status: DC | PRN

## 2014-09-09 MED ORDER — DICLOFENAC POTASSIUM 50 MG PO TABS: 50.0000 mg | ORAL_TABLET | Freq: Three times a day (TID) | ORAL | Status: DC

## 2014-09-09 NOTE — Progress Notes (Signed)
Subjective:     Patient ID: Angela FavaAngel S Manning, female   DOB: 11/14/1986, 28 y.o.   MRN: [id number]  HPI LMP 09/04/2014 Pt presents with ~822months of pelvic pain. She was seen in the ED and dx with left ov cyst.  She reports that she continues to have pain. She reports that the pain is worse with intercourse.  No N/V.  Past Medical History  Diagnosis Date  . Pregnancy induced hypertension    Past Surgical History  Procedure Laterality Date  . No past surgeries    . Dilation and evacuation N/A 02/21/2013    Procedure: DILATATION AND EVACUATION;  Surgeon: Reva Boresanya S Pratt, MD;  Location: [facility];  Service: Gynecology;  Laterality: N/A;   Current Outpatient Prescriptions on File Prior to Visit  Medication Sig Dispense Refill  . HYDROcodone-acetaminophen (NORCO/VICODIN) 5-325 MG per tablet Take 1 tablet by mouth every 4 (four) hours as needed.  12 tablet  0   No current facility-administered medications on file prior to visit.  No Known Allergies History   Social History  . Marital Status: Single    Spouse Name: N/A    Number of Children: N/A  . Years of Education: N/A   Occupational History  . Not on file.   Social History Main Topics  . Smoking status: Current Every Day Smoker -- 0.25 packs/day    Types: Cigarettes  . Smokeless tobacco: Never Used  . Alcohol Use: Yes     Comment: occ  . Drug Use: No  . Sexual Activity: Yes    Birth Control/ Protection: None   Other Topics Concern  . Not on file   Social History Narrative  . No narrative on file           Review of Systems     Objective:   Physical Exam BP 142/79  Pulse 86  Ht 5\' 6"  (1.676 m)  Wt 127 lb 4.8 oz (57.743 kg)  BMI 20.56 kg/m2  LMP 09/04/2014 Pt in NAD Abd: soft, ND.  + tenderness with palpation GU: EGBUS: no lesions Vagina: no blood in vault Cervix: no lesion; no mucopurulent d/c Uterus: small, mobile Adnexa: some rebound from right to left.  Tenderness on the left side.  Left ovary ~5-6cm   Consistent with sono   07/31/2014 CLINICAL DATA: Large left ovarian cyst on CT.  EXAM:  TRANSABDOMINAL AND TRANSVAGINAL ULTRASOUND OF PELVIS  DOPPLER ULTRASOUND OF OVARIES  TECHNIQUE:  Both transabdominal and transvaginal ultrasound examinations of the  pelvis were performed. Transabdominal technique was performed for  global imaging of the pelvis including uterus, ovaries, adnexal  regions, and pelvic cul-de-sac.  It was necessary to proceed with endovaginal exam following the  transabdominal exam to visualize the ovaries. Color and duplex  Doppler ultrasound was utilized to evaluate blood flow to the  ovaries.  COMPARISON: None.  FINDINGS:  Uterus  Measurements: 10.2 x 5.3 x 6.0 cm. No fibroids or other mass  visualized.  Endometrium  Thickness: 11 mm. No focal abnormality visualized.  Right ovary  Measurements: 2.9 x 2.3 x 2.0 cm. Normal appearance/no adnexal  mass.  Left ovary  Measurements: 5.1 x 4.4 x 5.4 cm. Cyst in the left ovary measures  3.8 x 3.4 x 4.3 cm. This has a lace-like, reticulated internal  appearance most likely representing a hemorrhagic cyst.  Pulsed Doppler evaluation of both ovaries demonstrates normal  low-resistance arterial and venous waveforms.  Other findings  Small amount of free fluid noted within the pelvis.  IMPRESSION:  1. No evidence for ovarian torsion.  2. Probable left ovarian hemorrhagic cyst. Short-interval follow up  ultrasound in 6-12 weeks is recommended, preferably during the week  following the patient's normal menses.  3. Small amount of free fluid in the pelvis may be physiologic in a  premenopausal female.      Assessment:     Pelvic pain- left ovarian cyst.  Suspect hemorrhagic       Plan:     F/u pelvic US in 4 weeks Cataflam and Ultram prn pain Sprintec 1 po q day F/u 6 weeks or sooner prn

## 2014-09-09 NOTE — Patient Instructions (Signed)
Ovarian Cyst An ovarian cyst is a fluid-filled sac that forms on an ovary. The ovaries are small organs that produce eggs in women. Various types of cysts can form on the ovaries. Most are not cancerous. Many do not cause problems, and they often go away on their own. Some may cause symptoms and require treatment. Common types of ovarian cysts include:  Functional cysts--These cysts may occur every month during the menstrual cycle. This is normal. The cysts usually go away with the next menstrual cycle if the woman does not get pregnant. Usually, there are no symptoms with a functional cyst.  Endometrioma cysts--These cysts form from the tissue that lines the uterus. They are also called "chocolate cysts" because they become filled with blood that turns brown. This type of cyst can cause pain in the lower abdomen during intercourse and with your menstrual period.  Cystadenoma cysts--This type develops from the cells on the outside of the ovary. These cysts can get very big and cause lower abdomen pain and pain with intercourse. This type of cyst can twist on itself, cut off its blood supply, and cause severe pain. It can also easily rupture and cause a lot of pain.  Dermoid cysts--This type of cyst is sometimes found in both ovaries. These cysts may contain different kinds of body tissue, such as skin, teeth, hair, or cartilage. They usually do not cause symptoms unless they get very big.  Theca lutein cysts--These cysts occur when too much of a certain hormone (human chorionic gonadotropin) is produced and overstimulates the ovaries to produce an egg. This is most common after procedures used to assist with the conception of a baby (in vitro fertilization). CAUSES   Fertility drugs can cause a condition in which multiple large cysts are formed on the ovaries. This is called ovarian hyperstimulation syndrome.  A condition called polycystic ovary syndrome can cause hormonal imbalances that can lead to  nonfunctional ovarian cysts. SIGNS AND SYMPTOMS  Many ovarian cysts do not cause symptoms. If symptoms are present, they may include:  Pelvic pain or pressure.  Pain in the lower abdomen.  Pain during sexual intercourse.  Increasing girth (swelling) of the abdomen.  Abnormal menstrual periods.  Increasing pain with menstrual periods.  Stopping having menstrual periods without being pregnant. DIAGNOSIS  These cysts are commonly found during a routine or annual pelvic exam. Tests may be ordered to find out more about the cyst. These tests may include:  Ultrasound.  X-ray of the pelvis.  CT scan.  MRI.  Blood tests. TREATMENT  Many ovarian cysts go away on their own without treatment. Your health care provider may want to check your cyst regularly for 2-3 months to see if it changes. For women in menopause, it is particularly important to monitor a cyst closely because of the higher rate of ovarian cancer in menopausal women. When treatment is needed, it may include any of the following:  A procedure to drain the cyst (aspiration). This may be done using a long needle and ultrasound. It can also be done through a laparoscopic procedure. This involves using a thin, lighted tube with a tiny camera on the end (laparoscope) inserted through a small incision.  Surgery to remove the whole cyst. This may be done using laparoscopic surgery or an open surgery involving a larger incision in the lower abdomen.  Hormone treatment or birth control pills. These methods are sometimes used to help dissolve a cyst. HOME CARE INSTRUCTIONS   Only take over-the-counter   or prescription medicines as directed by your health care provider.  Follow up with your health care provider as directed.  Get regular pelvic exams and Pap tests. SEEK MEDICAL CARE IF:   Your periods are late, irregular, or painful, or they stop.  Your pelvic pain or abdominal pain does not go away.  Your abdomen becomes  larger or swollen.  You have pressure on your bladder or trouble emptying your bladder completely.  You have pain during sexual intercourse.  You have feelings of fullness, pressure, or discomfort in your stomach.  You lose weight for no apparent reason.  You feel generally ill.  You become constipated.  You lose your appetite.  You develop acne.  You have an increase in body and facial hair.  You are gaining weight, without changing your exercise and eating habits.  You think you are pregnant. SEEK IMMEDIATE MEDICAL CARE IF:   You have increasing abdominal pain.  You feel sick to your stomach (nauseous), and you throw up (vomit).  You develop a fever that comes on suddenly.  You have abdominal pain during a bowel movement.  Your menstrual periods become heavier than usual. MAKE SURE YOU:  Understand these instructions.  Will watch your condition.  Will get help right away if you are not doing well or get worse. Document Released: 11/25/2005 Document Revised: 11/30/2013 Document Reviewed: 08/02/2013 Exit[employer] Patient Information 2015 [employer]. This information is not intended to replace advice given to you by your health care provider. Make sure you discuss any questions you have with your health care provider.  

## 2014-10-07 ENCOUNTER — Ambulatory Visit (HOSPITAL_COMMUNITY): Admission: RE | Admit: 2014-10-07 | Payer: Medicaid Other | Source: Ambulatory Visit

## 2014-10-10 ENCOUNTER — Encounter: Payer: Self-pay | Admitting: Obstetrics & Gynecology

## 2014-10-23 ENCOUNTER — Emergency Department (HOSPITAL_COMMUNITY)
Admission: EM | Admit: 2014-10-23 | Discharge: 2014-10-23 | Discharge status: Against Medical Advice | Payer: Medicaid Other | Source: Home / Self Care

## 2014-10-23 ENCOUNTER — Emergency Department (HOSPITAL_COMMUNITY)
Admission: EM | Admit: 2014-10-23 | Discharge: 2014-10-23 | Disposition: A | Discharge status: Home / Self Care | Payer: Self-pay

## 2014-10-23 ENCOUNTER — Encounter (HOSPITAL_COMMUNITY): Payer: Self-pay

## 2014-10-23 ENCOUNTER — Inpatient Hospital Stay (HOSPITAL_COMMUNITY)
Admission: AD | Admit: 2014-10-23 | Discharge: 2014-10-23 | Disposition: A | Discharge status: Home / Self Care | Payer: Medicaid Other | Source: Ambulatory Visit | Attending: Obstetrics & Gynecology | Admitting: Obstetrics & Gynecology

## 2014-10-23 DIAGNOSIS — F17200 Nicotine dependence, unspecified, uncomplicated: Secondary | ICD-10-CM | POA: Diagnosis present

## 2014-10-23 DIAGNOSIS — Z5329 Procedure and treatment not carried out because of patient's decision for other reasons: Secondary | ICD-10-CM

## 2014-10-23 DIAGNOSIS — N8320 Unspecified ovarian cysts: Secondary | ICD-10-CM | POA: Diagnosis not present

## 2014-10-23 DIAGNOSIS — N83202 Unspecified ovarian cyst, left side: Secondary | ICD-10-CM

## 2014-10-23 DIAGNOSIS — Z720 Tobacco use: Secondary | ICD-10-CM

## 2014-10-23 LAB — POCT PREGNANCY, URINE: Preg Test, Ur: NEGATIVE

## 2014-10-23 LAB — URINALYSIS, ROUTINE W REFLEX MICROSCOPIC
Bilirubin Urine: NEGATIVE
Glucose, UA: NEGATIVE mg/dL
Hgb urine dipstick: NEGATIVE
Ketones, ur: NEGATIVE mg/dL
Leukocytes, UA: NEGATIVE
Nitrite: NEGATIVE
Protein, ur: NEGATIVE mg/dL
Specific Gravity, Urine: 1.025 (ref 1.005–1.030)
Urobilinogen, UA: 0.2 mg/dL (ref 0.0–1.0)
pH: 6.5 (ref 5.0–8.0)

## 2014-10-23 MED ORDER — PROMETHAZINE HCL 25 MG PO TABS: 25.0000 mg | ORAL_TABLET | Freq: Four times a day (QID) | ORAL | Status: DC | PRN

## 2014-10-23 MED ORDER — TRAMADOL HCL 50 MG PO TABS: 50.0000 mg | ORAL_TABLET | Freq: Four times a day (QID) | ORAL | Status: DC | PRN

## 2014-10-23 MED ORDER — KETOROLAC TROMETHAMINE 60 MG/2ML IM SOLN
60.0000 mg | Freq: Once | INTRAMUSCULAR | Status: AC
  Administered 2014-10-23: 60 mg via INTRAMUSCULAR
  Filled 2014-10-23: qty 2

## 2014-10-23 NOTE — MAU Note (Signed)
Pt presents to MAU with complaints of pain in her left lower part of her abdomen that started last night. Reports she was diagnosed was a cyst on her left ovary in August

## 2014-10-23 NOTE — Discharge Instructions (Signed)
Ovarian Cyst An ovarian cyst is a fluid-filled sac that forms on an ovary. The ovaries are small organs that produce eggs in women. Various types of cysts can form on the ovaries. Most are not cancerous. Many do not cause problems, and they often go away on their own. Some may cause symptoms and require treatment. Common types of ovarian cysts include:  Functional cysts--These cysts may occur every month during the menstrual cycle. This is normal. The cysts usually go away with the next menstrual cycle if the woman does not get pregnant. Usually, there are no symptoms with a functional cyst.  Endometrioma cysts--These cysts form from the tissue that lines the uterus. They are also called "chocolate cysts" because they become filled with blood that turns brown. This type of cyst can cause pain in the lower abdomen during intercourse and with your menstrual period.  Cystadenoma cysts--This type develops from the cells on the outside of the ovary. These cysts can get very big and cause lower abdomen pain and pain with intercourse. This type of cyst can twist on itself, cut off its blood supply, and cause severe pain. It can also easily rupture and cause a lot of pain.  Dermoid cysts--This type of cyst is sometimes found in both ovaries. These cysts may contain different kinds of body tissue, such as skin, teeth, hair, or cartilage. They usually do not cause symptoms unless they get very big.  Theca lutein cysts--These cysts occur when too much of a certain hormone (human chorionic gonadotropin) is produced and overstimulates the ovaries to produce an egg. This is most common after procedures used to assist with the conception of a baby (in vitro fertilization). CAUSES   Fertility drugs can cause a condition in which multiple large cysts are formed on the ovaries. This is called ovarian hyperstimulation syndrome.  A condition called polycystic ovary syndrome can cause hormonal imbalances that can lead to  nonfunctional ovarian cysts. SIGNS AND SYMPTOMS  Many ovarian cysts do not cause symptoms. If symptoms are present, they may include:  Pelvic pain or pressure.  Pain in the lower abdomen.  Pain during sexual intercourse.  Increasing girth (swelling) of the abdomen.  Abnormal menstrual periods.  Increasing pain with menstrual periods.  Stopping having menstrual periods without being pregnant. DIAGNOSIS  These cysts are commonly found during a routine or annual pelvic exam. Tests may be ordered to find out more about the cyst. These tests may include:  Ultrasound.  X-ray of the pelvis.  CT scan.  MRI.  Blood tests. TREATMENT  Many ovarian cysts go away on their own without treatment. Your health care provider may want to check your cyst regularly for 2-3 months to see if it changes. For women in menopause, it is particularly important to monitor a cyst closely because of the higher rate of ovarian cancer in menopausal women. When treatment is needed, it may include any of the following:  A procedure to drain the cyst (aspiration). This may be done using a long needle and ultrasound. It can also be done through a laparoscopic procedure. This involves using a thin, lighted tube with a tiny camera on the end (laparoscope) inserted through a small incision.  Surgery to remove the whole cyst. This may be done using laparoscopic surgery or an open surgery involving a larger incision in the lower abdomen.  Hormone treatment or birth control pills. These methods are sometimes used to help dissolve a cyst. HOME CARE INSTRUCTIONS   Only take over-the-counter   or prescription medicines as directed by your health care provider.  Follow up with your health care provider as directed.  Get regular pelvic exams and Pap tests. SEEK MEDICAL CARE IF:   Your periods are late, irregular, or painful, or they stop.  Your pelvic pain or abdominal pain does not go away.  Your abdomen becomes  larger or swollen.  You have pressure on your bladder or trouble emptying your bladder completely.  You have pain during sexual intercourse.  You have feelings of fullness, pressure, or discomfort in your stomach.  You lose weight for no apparent reason.  You feel generally ill.  You become constipated.  You lose your appetite.  You develop acne.  You have an increase in body and facial hair.  You are gaining weight, without changing your exercise and eating habits.  You think you are pregnant. SEEK IMMEDIATE MEDICAL CARE IF:   You have increasing abdominal pain.  You feel sick to your stomach (nauseous), and you throw up (vomit).  You develop a fever that comes on suddenly.  You have abdominal pain during a bowel movement.  Your menstrual periods become heavier than usual. MAKE SURE YOU:  Understand these instructions.  Will watch your condition.  Will get help right away if you are not doing well or get worse. Document Released: 11/25/2005 Document Revised: 11/30/2013 Document Reviewed: 08/02/2013 ExitCare Patient Information 2015 [employer]. This information is not intended to replace advice given to you by your health care provider. Make sure you discuss any questions you have with your health care provider.  

## 2014-10-23 NOTE — ED Notes (Signed)
Called pt numerous times from waiting room. No response

## 2014-10-23 NOTE — MAU Provider Note (Signed)
History     CSN: [id number]  Arrival date and time: 10/23/14 1348   First Provider Initiated Contact with Patient 10/23/14 1527      Chief Complaint  Patient presents with  . cyst on left ovary    HPI Comments: Angela Manning 28 y.o. [id number] presents to MAU with ongoing pain with her left ovarian cyst. [facility] She has an appointment for follow up ultrasound in 7 days and return appointment with Clinic. She did not pick up her BCPs or start them. Her last intercourse was a few days ago. She also states she did not take all her Ultram that she gave her Grandmother some of them because she has back pain. Her pain today is 10/10 .      Past Medical History  Diagnosis Date  . Pregnancy induced hypertension     Past Surgical History  Procedure Laterality Date  . No past surgeries    . Dilation and evacuation N/A 02/21/2013    Procedure: DILATATION AND EVACUATION;  Surgeon: Angela Boresanya S Pratt, MD;  Location: [facility];  Service: Gynecology;  Laterality: N/A;    Family History  Problem Relation Age of Onset  . Hypertension Mother     History  Substance Use Topics  . Smoking status: Current Every Day Smoker -- 0.25 packs/day    Types: Cigarettes  . Smokeless tobacco: Never Used  . Alcohol Use: Yes     Comment: occ    Allergies: No Known Allergies  Prescriptions prior to admission  Medication Sig Dispense Refill Last Dose  . acetaminophen (TYLENOL) 500 MG tablet Take 1,000 mg by mouth every 6 (six) hours as needed for mild pain.   10/23/2014 at Unknown time  . diclofenac (CATAFLAM) 50 MG tablet Take 1 tablet (50 mg total) by mouth 3 (three) times daily. (Patient not taking: Reported on 10/23/2014) 60 tablet 0   . HYDROcodone-acetaminophen (NORCO/VICODIN) 5-325 MG per tablet Take 1 tablet by mouth every 4 (four) hours as needed. (Patient not taking: Reported on 10/23/2014) 12 tablet 0 Not Taking  . norgestimate-ethinyl estradiol  (ORTHO-CYCLEN,SPRINTEC,PREVIFEM) 0.25-35 MG-MCG tablet Take 1 tablet by mouth daily. (Patient not taking: Reported on 10/23/2014) 1 Package 11   . traMADol (ULTRAM) 50 MG tablet Take 1 tablet (50 mg total) by mouth every 6 (six) hours as needed. (Patient not taking: Reported on 10/23/2014) 60 tablet 0     Review of Systems  Constitutional: Negative.   HENT: Negative.   Eyes: Negative.   Respiratory: Negative.   Cardiovascular: Negative.   Gastrointestinal: Positive for abdominal pain.  Genitourinary: Negative.   Musculoskeletal: Negative.   Skin: Negative.   Neurological: Negative.   Psychiatric/Behavioral: Negative.    Physical Exam   Blood pressure 147/97, pulse 69, temperature 98.3 F (36.8 C), resp. rate 18, last menstrual period 10/02/2014.  Physical Exam  Constitutional: She appears well-developed and well-nourished. No distress.  HENT:  Head: Normocephalic and atraumatic.  Cardiovascular: Normal rate, regular rhythm and normal heart sounds.   Respiratory: Effort normal and breath sounds normal.  GI: Soft. There is tenderness.  LLQ tenderness  Musculoskeletal: Normal range of motion.  Neurological: She is alert.  Skin: Skin is warm and dry.    MAU Course  Procedures  MDM Toradol 60 mg PO IM helped with pain  Assessment and Plan  A: Left Ovarian Cyst  P: Advised to pick up birth control pills and start them after her next menses Advised  to use condoms till after first pack Ultram 50 mg PO qhs only/ do not share medications Phenergan 25 mg po  Keep U/S and follow up appointments Return to MAU as needed  Angela ServeBarefoot, Angela Manning 10/23/2014, 3:59 PM

## 2014-10-26 ENCOUNTER — Encounter: Payer: Self-pay | Admitting: *Deleted

## 2014-10-26 ENCOUNTER — Ambulatory Visit: Payer: Medicaid Other | Admitting: Obstetrics & Gynecology

## 2015-01-12 ENCOUNTER — Emergency Department (INDEPENDENT_AMBULATORY_CARE_PROVIDER_SITE_OTHER)
Admission: EM | Admit: 2015-01-12 | Discharge: 2015-01-12 | Disposition: A | Discharge status: Home / Self Care | Payer: Medicaid Other | Source: Home / Self Care | Attending: Emergency Medicine | Admitting: Emergency Medicine

## 2015-01-12 ENCOUNTER — Encounter (HOSPITAL_COMMUNITY): Payer: Self-pay | Admitting: Emergency Medicine

## 2015-01-12 DIAGNOSIS — IMO0001 Reserved for inherently not codable concepts without codable children: Secondary | ICD-10-CM

## 2015-01-12 DIAGNOSIS — R030 Elevated blood-pressure reading, without diagnosis of hypertension: Secondary | ICD-10-CM

## 2015-01-12 DIAGNOSIS — R55 Syncope and collapse: Secondary | ICD-10-CM

## 2015-01-12 LAB — POCT I-STAT, CHEM 8
BUN: 15 mg/dL (ref 6–23)
Calcium, Ion: 1.23 mmol/L (ref 1.12–1.23)
Chloride: 104 mmol/L (ref 96–112)
Creatinine, Ser: 0.7 mg/dL (ref 0.50–1.10)
Glucose, Bld: 83 mg/dL (ref 70–99)
HCT: 44 % (ref 36.0–46.0)
Hemoglobin: 15 g/dL (ref 12.0–15.0)
Potassium: 3.8 mmol/L (ref 3.5–5.1)
Sodium: 141 mmol/L (ref 135–145)
TCO2: 22 mmol/L (ref 0–100)

## 2015-01-12 LAB — POCT URINALYSIS DIP (DEVICE)
Bilirubin Urine: NEGATIVE
Glucose, UA: NEGATIVE mg/dL
Hgb urine dipstick: NEGATIVE
Ketones, ur: NEGATIVE mg/dL
Leukocytes, UA: NEGATIVE
Nitrite: NEGATIVE
Protein, ur: NEGATIVE mg/dL
Specific Gravity, Urine: 1.025 (ref 1.005–1.030)
Urobilinogen, UA: 0.2 mg/dL (ref 0.0–1.0)
pH: 7 (ref 5.0–8.0)

## 2015-01-12 LAB — POCT PREGNANCY, URINE: Preg Test, Ur: NEGATIVE

## 2015-01-12 MED ORDER — LISINOPRIL 10 MG PO TABS: 10.0000 mg | ORAL_TABLET | Freq: Every day | ORAL | Status: DC

## 2015-01-12 NOTE — Discharge Instructions (Signed)
It was advised by our department that you be transferred today to the ER for further evaluation. You have decided to go home instead. Please understand that if symptoms persist or become worse, you should seek immediate re-evaluation at your nearest ER. You have also been given a limited prescription for medication to control your blood pressure. You are strongly encouraged to follow up with the primary care provider of your choice as soon as possible. Your pregnancy test was negative. Your labs were normal.  Hypertension Hypertension, commonly called high blood pressure, is when the force of blood pumping through your arteries is too strong. Your arteries are the blood vessels that carry blood from your heart throughout your body. A blood pressure reading consists of a higher number over a lower number, such as 110/72. The higher number (systolic) is the pressure inside your arteries when your heart pumps. The lower number (diastolic) is the pressure inside your arteries when your heart relaxes. Ideally you want your blood pressure below 120/80. Hypertension forces your heart to work harder to pump blood. Your arteries may become narrow or stiff. Having hypertension puts you at risk for heart disease, stroke, and other problems.  RISK FACTORS Some risk factors for high blood pressure are controllable. Others are not.  Risk factors you cannot control include:   Race. You may be at higher risk if you are African American.  Age. Risk increases with age.  Gender. Men are at higher risk than women before age 60 years. After age 29, women are at higher risk than men. Risk factors you can control include:  Not getting enough exercise or physical activity.  Being overweight.  Getting too much fat, sugar, calories, or salt in your diet.  Drinking too much alcohol. SIGNS AND SYMPTOMS Hypertension does not usually cause signs or symptoms. Extremely high blood pressure (hypertensive crisis) may cause  headache, anxiety, shortness of breath, and nosebleed. DIAGNOSIS  To check if you have hypertension, your health care provider will measure your blood pressure while you are seated, with your arm held at the level of your heart. It should be measured at least twice using the same arm. Certain conditions can cause a difference in blood pressure between your right and left arms. A blood pressure reading that is higher than normal on one occasion does not mean that you need treatment. If one blood pressure reading is high, ask your health care provider about having it checked again. TREATMENT  Treating high blood pressure includes making lifestyle changes and possibly taking medicine. Living a healthy lifestyle can help lower high blood pressure. You may need to change some of your habits. Lifestyle changes may include:  Following the DASH diet. This diet is high in fruits, vegetables, and whole grains. It is low in salt, red meat, and added sugars.  Getting at least 2 hours of brisk physical activity every week.  Losing weight if necessary.  Not smoking.  Limiting alcoholic beverages.  Learning ways to reduce stress. If lifestyle changes are not enough to get your blood pressure under control, your health care provider may prescribe medicine. You may need to take more than one. Work closely with your health care provider to understand the risks and benefits. HOME CARE INSTRUCTIONS  Have your blood pressure rechecked as directed by your health care provider.   Take medicines only as directed by your health care provider. Follow the directions carefully. Blood pressure medicines must be taken as prescribed. The medicine does not work  as well when you skip doses. Skipping doses also puts you at risk for problems.   Do not smoke.   Monitor your blood pressure at home as directed by your health care provider. SEEK MEDICAL CARE IF:   You think you are having a reaction to medicines  taken.  You have recurrent headaches or feel dizzy.  You have swelling in your ankles.  You have trouble with your vision. SEEK IMMEDIATE MEDICAL CARE IF:  You develop a severe headache or confusion.  You have unusual weakness, numbness, or feel faint.  You have severe chest or abdominal pain.  You vomit repeatedly.  You have trouble breathing. MAKE SURE YOU:   Understand these instructions.  Will watch your condition.  Will get help right away if you are not doing well or get worse. Document Released: 11/25/2005 Document Revised: 04/11/2014 Document Reviewed: 09/17/2013 [facility] Patient Information 2015 [place], [place]. This information is not intended to replace advice given to you by your health care provider. Make sure you discuss any questions you have with your health care provider.  Near-Syncope Near-syncope (commonly known as near fainting) is sudden weakness, dizziness, or feeling like you might pass out. During an episode of near-syncope, you may also develop pale skin, have tunnel vision, or feel sick to your stomach (nauseous). Near-syncope may occur when getting up after sitting or while standing for a long time. It is caused by a sudden decrease in blood flow to the brain. This decrease can result from various causes or triggers, most of which are not serious. However, because near-syncope can sometimes be a sign of something serious, a medical evaluation is required. The specific cause is often not determined. HOME CARE INSTRUCTIONS  Monitor your condition for any changes. The following actions may help to alleviate any discomfort you are experiencing:  Have someone stay with you until you feel stable.  Lie down right away and prop your feet up if you start feeling like you might faint. Breathe deeply and steadily. Wait until all the symptoms have passed. Most of these episodes last only a few minutes. You may feel tired for several hours.   Drink enough fluids to  keep your urine clear or pale yellow.   If you are taking blood pressure or heart medicine, get up slowly when seated or lying down. Take several minutes to sit and then stand. This can reduce dizziness.  Follow up with your health care provider as directed. SEEK IMMEDIATE MEDICAL CARE IF:   You have a severe headache.   You have unusual pain in the chest, abdomen, or back.   You are bleeding from the mouth or rectum, or you have black or tarry stool.   You have an irregular or very fast heartbeat.   You have repeated fainting or have seizure-like jerking during an episode.   You faint when sitting or lying down.   You have confusion.   You have difficulty walking.   You have severe weakness.   You have vision problems.  MAKE SURE YOU:   Understand these instructions.  Will watch your condition.  Will get help right away if you are not doing well or get worse. Document Released: 11/25/2005 Document Revised: 11/30/2013 Document Reviewed: 04/30/2013 [facility] CareExit[facility] Patient Information 2015 [place], [place]. This information is not intended to replace advice given to you by your health care provider. Make sure you discuss any questions you have with your health care provider.

## 2015-01-12 NOTE — ED Notes (Signed)
C/o dizziness that started last night. C/o headache, no fever, no cough or cold, generalized achiness.  Last bm yesterday.  No diarrhea.  History of preeclampsia with pregnancy.

## 2015-01-12 NOTE — ED Notes (Signed)
[facility] [place] [place]

## 2015-01-12 NOTE — ED Provider Notes (Addendum)
CSN: [id number]     Arrival date & time 01/12/15  0926 History   None    Chief Complaint  Patient presents with  . Dizziness   (Consider location/radiation/quality/duration/timing/severity/associated sxs/prior Treatment) HPI Comments: Reports near syncope with standing and flashing lights visual disturbance in both visual fields. Flashing light disturbance DOES NOT occur when eyes are closed. PCP: none Hx of PIH Smoker Works at [employer] LNMP: 01/01/2015. Endorses heavy menses. No recent illness or injury   Patient is a 29 y.o. female presenting with dizziness. The history is provided by the patient.  Dizziness Quality:  Lightheadedness (+near syncope) Severity:  Moderate Onset quality:  Gradual Duration:  24 hours Timing:  Constant Progression:  Unchanged Chronicity:  New Context: standing up   Relieved by:  Nothing Worsened by:  Standing up Ineffective treatments:  Lying down Associated symptoms: headaches and vision changes   Associated symptoms: no blood in stool, no chest pain, no diarrhea, no hearing loss, no nausea, no palpitations, no shortness of breath, no syncope, no tinnitus, no vomiting and no weakness     Past Medical History  Diagnosis Date  . Pregnancy induced hypertension    Past Surgical History  Procedure Laterality Date  . No past surgeries    . Dilation and evacuation N/A 02/21/2013    Procedure: DILATATION AND EVACUATION;  Surgeon: Reva Bores, MD;  Location: [facility];  Service: Gynecology;  Laterality: N/A;   Family History  Problem Relation Age of Onset  . Hypertension Mother    History  Substance Use Topics  . Smoking status: Current Every Day Smoker -- 0.25 packs/day    Types: Cigarettes  . Smokeless tobacco: Never Used  . Alcohol Use: Yes     Comment: occ   OB History    Gravida Para Term Preterm AB TAB SAB Ectopic Multiple Living   Review of Systems  Constitutional: Negative.   HENT: Negative for hearing loss  and tinnitus.   Eyes: Positive for visual disturbance. Negative for photophobia, pain, discharge, redness and itching.  Respiratory: Negative for cough, chest tightness and shortness of breath.   Cardiovascular: Negative for chest pain, palpitations, leg swelling and syncope.  Gastrointestinal: Negative for nausea, vomiting, abdominal pain, diarrhea and blood in stool.  Endocrine: Negative for polydipsia, polyphagia and polyuria.  Genitourinary: Negative.   Musculoskeletal: Negative.   Skin: Negative.   Neurological: Positive for dizziness, light-headedness and headaches. Negative for seizures, speech difficulty and numbness.    Allergies  Review of patient's allergies indicates no known allergies.  Home Medications   Prior to Admission medications   Medication Sig Start Date End Date Taking? Authorizing Provider  acetaminophen (TYLENOL) 500 MG tablet Take 1,000 mg by mouth every 6 (six) hours as needed for mild pain.    Historical Provider, MD  diclofenac (CATAFLAM) 50 MG tablet Take 1 tablet (50 mg total) by mouth 3 (three) times daily. Patient not taking: Reported on 10/23/2014 09/09/14   Willodean Rosenthal, MD  norgestimate-ethinyl estradiol (ORTHO-CYCLEN,SPRINTEC,PREVIFEM) 0.25-35 MG-MCG tablet Take 1 tablet by mouth daily. Patient not taking: Reported on 10/23/2014 09/09/14   Willodean Rosenthal, MD  promethazine (PHENERGAN) 25 MG tablet Take 1 tablet (25 mg total) by mouth every 6 (six) hours as needed for nausea or vomiting. 10/23/14   Delbert Phenix, NP  traMADol (ULTRAM) 50 MG tablet Take 1 tablet (50 mg total) by mouth every 6 (six) hours as  needed. 10/23/14   Delbert PhenixLinda M Barefoot, NP   BP 165/110 mmHg  Pulse 75  Temp(Src) 97.6 F (36.4 C) (Oral)  Resp 16  SpO2 100%  LMP 01/01/2015 Physical Exam  Constitutional: She is oriented to person, place, and time. She appears well-developed and well-nourished. No distress.  HENT:  Head: Normocephalic and atraumatic.  Right  Ear: Hearing, tympanic membrane, external ear and ear canal normal.  Left Ear: Hearing, tympanic membrane, external ear and ear canal normal.  Nose: Nose normal.  Mouth/Throat: Uvula is midline, oropharynx is clear and moist and mucous membranes are normal.  Eyes: Conjunctivae, EOM and lids are normal. Pupils are equal, round, and reactive to light.  Limited ability to visualize retina on un dilated fundoscopic exam  Neck: Normal range of motion. Neck supple. No thyromegaly present.  Cardiovascular: Normal rate, regular rhythm and normal heart sounds.   Pulmonary/Chest: Effort normal and breath sounds normal. No respiratory distress. She has no wheezes.  Abdominal: Soft. Bowel sounds are normal. She exhibits no distension. There is no tenderness.  Musculoskeletal: Normal range of motion.  Lymphadenopathy:    She has no cervical adenopathy.  Neurological: She is alert and oriented to person, place, and time. She has normal strength. No cranial nerve deficit or sensory deficit. Coordination and gait normal. GCS eye subscore is 4. GCS verbal subscore is 5. GCS motor subscore is 6.  Skin: Skin is warm and dry.  Psychiatric: She has a normal mood and affect. Her behavior is normal.  Nursing note and vitals reviewed.   ED Course  Procedures (including critical care time) Labs Review Labs Reviewed  POCT I-STAT, CHEM 8  POCT URINALYSIS DIP (DEVICE)  POCT PREGNANCY, URINE    Imaging Review No results found.   MDM   1. Elevated blood pressure   2. Near syncope    UPT negative UA unremarkable Istat 8 normal. No anemia. EKG: NSR with sinus arrhythmia at 63 bpm.  29 y/o F with postural near syncope, visual disturbance and HTN who has no access to PCP follow up.  [facility]. May benefit from medication to reduce BP followed by period of observation to see if symptoms improve.    Ria ClockJennifer Lee H Presson, [place] 01/12/15 1050  Addendum  @ 1134a: Upon advising patient of my wish to transfer her to the ER for further evaluation, she stated she does not want to go and just wants to go home. I expressed my concern regarding this decision and advised her to seek immediate re-evaluation if symptoms persist or worsen. Provided verbal and written instructions for patient along with limited Rx for lisinopril for HTN. AMA form signed by patient.   Ria ClockJennifer Lee H Presson, [place] 01/12/15 1136

## 2015-01-24 ENCOUNTER — Encounter (HOSPITAL_COMMUNITY): Payer: Self-pay | Admitting: Emergency Medicine

## 2015-01-24 ENCOUNTER — Emergency Department (HOSPITAL_COMMUNITY): Payer: Medicaid Other

## 2015-01-24 ENCOUNTER — Emergency Department (HOSPITAL_COMMUNITY)
Admission: EM | Admit: 2015-01-24 | Discharge: 2015-01-24 | Disposition: A | Discharge status: Home / Self Care | Payer: Medicaid Other | Attending: Emergency Medicine | Admitting: Emergency Medicine

## 2015-01-24 DIAGNOSIS — Y9389 Activity, other specified: Secondary | ICD-10-CM | POA: Diagnosis not present

## 2015-01-24 DIAGNOSIS — S4992XA Unspecified injury of left shoulder and upper arm, initial encounter: Secondary | ICD-10-CM | POA: Diagnosis present

## 2015-01-24 DIAGNOSIS — Y998 Other external cause status: Secondary | ICD-10-CM | POA: Diagnosis not present

## 2015-01-24 DIAGNOSIS — Z720 Tobacco use: Secondary | ICD-10-CM | POA: Diagnosis not present

## 2015-01-24 DIAGNOSIS — M25512 Pain in left shoulder: Secondary | ICD-10-CM

## 2015-01-24 DIAGNOSIS — Y9289 Other specified places as the place of occurrence of the external cause: Secondary | ICD-10-CM | POA: Diagnosis not present

## 2015-01-24 DIAGNOSIS — R52 Pain, unspecified: Secondary | ICD-10-CM

## 2015-01-24 DIAGNOSIS — T1490XA Injury, unspecified, initial encounter: Secondary | ICD-10-CM

## 2015-01-24 MED ORDER — CYCLOBENZAPRINE HCL 10 MG PO TABS
10.0000 mg | ORAL_TABLET | Freq: Once | ORAL | Status: AC
  Administered 2015-01-24: 10 mg via ORAL
  Filled 2015-01-24: qty 1

## 2015-01-24 MED ORDER — CYCLOBENZAPRINE HCL 10 MG PO TABS
10.0000 mg | ORAL_TABLET | Freq: Three times a day (TID) | ORAL | Status: DC | PRN
Start: 2015-01-24 — End: 2015-03-01

## 2015-01-24 MED ORDER — IBUPROFEN 800 MG PO TABS: 800.0000 mg | ORAL_TABLET | Freq: Three times a day (TID) | ORAL | Status: DC | PRN

## 2015-01-24 NOTE — Discharge Instructions (Signed)
Read the information below.  Use the prescribed medication as directed.  Please discuss all new medications with your pharmacist.  You may return to the Emergency Department at any time for worsening condition or any new symptoms that concern you.  If there is any possibility that you might be pregnant, please let your health care provider know and discuss this with the pharmacist to ensure medication safety.  If you develop uncontrolled pain, weakness or numbness of the extremity, severe discoloration of the skin, or you are unable to move your arm, return to the ER for a recheck.    °

## 2015-01-24 NOTE — ED Notes (Signed)
Pt. slipped and fell last Saturday , reports pain at left shoulder joint , pain increases with movement .
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
## 2015-01-27 ENCOUNTER — Encounter (HOSPITAL_COMMUNITY): Payer: Self-pay | Admitting: Emergency Medicine

## 2015-01-27 ENCOUNTER — Emergency Department (HOSPITAL_COMMUNITY)
Admission: EM | Admit: 2015-01-27 | Discharge: 2015-01-27 | Disposition: A | Discharge status: Home / Self Care | Payer: Medicaid Other | Attending: Emergency Medicine | Admitting: Emergency Medicine

## 2015-01-27 DIAGNOSIS — Z79899 Other long term (current) drug therapy: Secondary | ICD-10-CM | POA: Insufficient documentation

## 2015-01-27 DIAGNOSIS — N946 Dysmenorrhea, unspecified: Secondary | ICD-10-CM

## 2015-01-27 DIAGNOSIS — Z720 Tobacco use: Secondary | ICD-10-CM | POA: Diagnosis not present

## 2015-01-27 DIAGNOSIS — N938 Other specified abnormal uterine and vaginal bleeding: Secondary | ICD-10-CM | POA: Diagnosis present

## 2015-01-27 DIAGNOSIS — Z3202 Encounter for pregnancy test, result negative: Secondary | ICD-10-CM | POA: Insufficient documentation

## 2015-01-27 LAB — URINALYSIS, ROUTINE W REFLEX MICROSCOPIC
Bilirubin Urine: NEGATIVE
Glucose, UA: NEGATIVE mg/dL
Ketones, ur: NEGATIVE mg/dL
Leukocytes, UA: NEGATIVE
Nitrite: NEGATIVE
Protein, ur: NEGATIVE mg/dL
Specific Gravity, Urine: 1.022 (ref 1.005–1.030)
Urobilinogen, UA: 1 mg/dL (ref 0.0–1.0)
pH: 6.5 (ref 5.0–8.0)

## 2015-01-27 LAB — CBC WITH DIFFERENTIAL/PLATELET
Basophils Absolute: 0.1 10*3/uL (ref 0.0–0.1)
Basophils Relative: 1 % (ref 0–1)
Eosinophils Absolute: 0.2 10*3/uL (ref 0.0–0.7)
Eosinophils Relative: 4 % (ref 0–5)
HCT: 41.2 % (ref 36.0–46.0)
Hemoglobin: 13.3 g/dL (ref 12.0–15.0)
Lymphocytes Relative: 39 % (ref 12–46)
Lymphs Abs: 2.4 10*3/uL (ref 0.7–4.0)
MCH: 26.7 pg (ref 26.0–34.0)
MCHC: 32.3 g/dL (ref 30.0–36.0)
MCV: 82.6 fL (ref 78.0–100.0)
Monocytes Absolute: 0.6 10*3/uL (ref 0.1–1.0)
Monocytes Relative: 9 % (ref 3–12)
Neutro Abs: 2.9 10*3/uL (ref 1.7–7.7)
Neutrophils Relative %: 47 % (ref 43–77)
Platelets: 210 10*3/uL (ref 150–400)
RBC: 4.99 MIL/uL (ref 3.87–5.11)
RDW: 14.9 % (ref 11.5–15.5)
WBC: 6.2 10*3/uL (ref 4.0–10.5)

## 2015-01-27 LAB — WET PREP, GENITAL
Trich, Wet Prep: NONE SEEN
Yeast Wet Prep HPF POC: NONE SEEN

## 2015-01-27 LAB — COMPREHENSIVE METABOLIC PANEL
ALT: 14 U/L (ref 0–35)
AST: 19 U/L (ref 0–37)
Albumin: 4 g/dL (ref 3.5–5.2)
Alkaline Phosphatase: 52 U/L (ref 39–117)
Anion gap: 7 (ref 5–15)
BUN: 7 mg/dL (ref 6–23)
CO2: 25 mmol/L (ref 19–32)
Calcium: 9.3 mg/dL (ref 8.4–10.5)
Chloride: 108 mmol/L (ref 96–112)
Creatinine, Ser: 0.86 mg/dL (ref 0.50–1.10)
GFR calc Af Amer: >90 mL/min (ref >90)
GFR calc non Af Amer: >90 mL/min (ref >90)
Glucose, Bld: 102 mg/dL — ABNORMAL HIGH (ref 70–99)
Potassium: 3.6 mmol/L (ref 3.5–5.1)
Sodium: 140 mmol/L (ref 135–145)
Total Bilirubin: 0.3 mg/dL (ref 0.3–1.2)
Total Protein: 7.4 g/dL (ref 6.0–8.3)

## 2015-01-27 LAB — URINE MICROSCOPIC-ADD ON

## 2015-01-27 LAB — GC/CHLAMYDIA PROBE AMP (~~LOC~~) NOT AT ARMC
Chlamydia: POSITIVE — AB
Neisseria Gonorrhea: NEGATIVE

## 2015-01-27 LAB — POC URINE PREG, ED: Preg Test, Ur: NEGATIVE

## 2015-01-27 LAB — HCG, QUANTITATIVE, PREGNANCY: hCG, Beta Chain, Quant, S: 1 m[IU]/mL (ref <5)

## 2015-01-27 NOTE — ED Provider Notes (Signed)
CSN: [id number]     Arrival date & time 01/27/15  0106 History  This chart was scribed for Olivia Mackielga M Laquanna Veazey, MD by Annye AsaAnna Dorsett, ED Scribe. This patient was seen in room A03C/A03C and the patient's care was started at 3:12 AM.    Chief Complaint  Patient presents with  . Vaginal Bleeding  . Abdominal Pain   Patient is a 29 y.o. female presenting with vaginal bleeding and abdominal pain. The history is provided by the patient. No language interpreter was used.  Vaginal Bleeding Associated symptoms: abdominal pain   Abdominal Pain Associated symptoms: vaginal bleeding      HPI Comments: Angela Manning is an otherwise healthy, [redacted] weeks pregnant 29 y.o. female who presents to the Emergency Department complaining of abdominal pain with vaginal bleeding. Patient reports she took a pregnancy test on 2/17 but began lightly spotting 2/18.   LNMP Dec 22 2014. G4 P3 A0, no complications.   Past Medical History  Diagnosis Date  . Pregnancy induced hypertension    Past Surgical History  Procedure Laterality Date  . No past surgeries    . Dilation and evacuation N/A 02/21/2013    Procedure: DILATATION AND EVACUATION;  Surgeon: Reva Boresanya S Pratt, MD;  Location: [facility];  Service: Gynecology;  Laterality: N/A;   Family History  Problem Relation Age of Onset  . Hypertension Mother    History  Substance Use Topics  . Smoking status: Current Every Day Smoker -- 0.25 packs/day    Types: Cigarettes  . Smokeless tobacco: Never Used  . Alcohol Use: Yes     Comment: occ   OB History    Gravida Para Term Preterm AB TAB SAB Ectopic Multiple Living   4 4 3 1      4      Review of Systems  Gastrointestinal: Positive for abdominal pain.  Genitourinary: Positive for vaginal bleeding.  All other systems reviewed and are negative.  Allergies  Review of patient's allergies indicates no known allergies.  Home Medications   Prior to Admission medications   Medication Sig Start Date End Date Taking?  Authorizing Provider  acetaminophen (TYLENOL) 500 MG tablet Take 1,000 mg by mouth every 6 (six) hours as needed for mild pain.   Yes Historical Provider, MD  cyclobenzaprine (FLEXERIL) 10 MG tablet Take 1 tablet (10 mg total) by mouth 3 (three) times daily as needed for muscle spasms. Patient not taking: Reported on 01/27/2015 01/24/15   Trixie DredgeEmily West, PA-C  diclofenac (CATAFLAM) 50 MG tablet Take 1 tablet (50 mg total) by mouth 3 (three) times daily. Patient not taking: Reported on 10/23/2014 09/09/14   Willodean Rosenthalarolyn Harraway-Smith, MD  ibuprofen (ADVIL,MOTRIN) 800 MG tablet Take 1 tablet (800 mg total) by mouth every 8 (eight) hours as needed for mild pain or moderate pain. Patient not taking: Reported on 01/27/2015 01/24/15   Trixie DredgeEmily West, PA-C  lisinopril (PRINIVIL,ZESTRIL) 10 MG tablet Take 1 tablet (10 mg total) by mouth daily. Patient not taking: Reported on 01/27/2015 01/12/15   Ria ClockJennifer Lee H Presson, PA  norgestimate-ethinyl estradiol (ORTHO-CYCLEN,SPRINTEC,PREVIFEM) 0.25-35 MG-MCG tablet Take 1 tablet by mouth daily. Patient not taking: Reported on 10/23/2014 09/09/14   Willodean Rosenthalarolyn Harraway-Smith, MD  promethazine (PHENERGAN) 25 MG tablet Take 1 tablet (25 mg total) by mouth every 6 (six) hours as needed for nausea or vomiting. Patient not taking: Reported on 01/27/2015 10/23/14   Delbert PhenixLinda M Barefoot, NP  traMADol (ULTRAM) 50 MG tablet Take 1 tablet (50 mg total) by mouth every  6 (six) hours as needed. Patient not taking: Reported on 01/27/2015 10/23/14   Doralee Albino Barefoot, NP   BP 157/105 mmHg  Pulse 68  Temp(Src) 98.2 F (36.8 C) (Oral)  Resp 14  Ht  (1.702 m)  Wt 122 lb 14.4 oz (55.747 kg)  BMI 19.24 kg/m2  SpO2 100%  LMP 01/01/2015 Physical Exam  Constitutional: She is oriented to person, place, and time. She appears well-developed and well-nourished. No distress.  HENT:  Head: Normocephalic and atraumatic.  Mouth/Throat: Oropharynx is clear and moist. No oropharyngeal exudate.  Eyes: EOM are  normal. Pupils are equal, round, and reactive to light.  Neck: Normal range of motion. Neck supple.  Cardiovascular: Normal rate, regular rhythm and normal heart sounds.  Exam reveals no gallop and no friction rub.   No murmur heard. Pulmonary/Chest: Effort normal. No respiratory distress. She has no wheezes. She has no rales.  Abdominal: Soft. There is no tenderness. There is no rebound and no guarding.  Genitourinary:  External genitalia normal Vagina with dark bloody discharge Cervix  normal negative for cervical motion tenderness Adnexa palpated, no masses or negative for tenderness noted Bladder palpated negative for tenderness Uterus palpated no masses or negative for tenderness    Musculoskeletal: Normal range of motion. She exhibits no edema.  Neurological: She is alert and oriented to person, place, and time.  Skin: Skin is warm and dry. No rash noted.  Psychiatric: She has a normal mood and affect. Her behavior is normal.  Nursing note and vitals reviewed.   ED Course  Procedures   DIAGNOSTIC STUDIES: Oxygen Saturation is 100% on RA, normal by my interpretation.    COORDINATION OF CARE: 3:14 AM Discussed treatment plan with pt at bedside and pt agreed to plan.   Labs Review Labs Reviewed  WET PREP, GENITAL - Abnormal; Notable for the following:    Clue Cells Wet Prep HPF POC FEW (*)    WBC, Wet Prep HPF POC RARE (*)    All other components within normal limits  COMPREHENSIVE METABOLIC PANEL - Abnormal; Notable for the following:    Glucose, Bld 102 (*)    All other components within normal limits  URINALYSIS, ROUTINE W REFLEX MICROSCOPIC - Abnormal; Notable for the following:    Hgb urine dipstick SMALL (*)    All other components within normal limits  CBC WITH DIFFERENTIAL/PLATELET  HCG, QUANTITATIVE, PREGNANCY  URINE MICROSCOPIC-ADD ON  RPR  HIV ANTIBODY (ROUTINE TESTING)  POC URINE PREG, ED  GC/CHLAMYDIA PROBE AMP ([place])    Imaging Review No  results found.   EKG Interpretation None      MDM   Final diagnoses:  Dysmenorrhea    I personally performed the services described in this documentation, which was scribed in my presence. The recorded information has been reviewed and is accurate.  29 year old female, G4P3 at 5 weeks and 1 day by LMP who presents with vaginal spotting and lower abdominal cramping.  Positive pregnancy test yesterday.  Plan for pelvic exam, transvaginal ultrasound   29 year old female with positive pregnancy test home, negative here with bleeding.  Suspect either false positive home pregnancy test or pregnancy that has completely miscarried.  Patient to follow-up with GYN when necessary  Olivia Mackie, MD 01/27/15 [phone]

## 2015-01-27 NOTE — Discharge Instructions (Signed)
Your pregnancy test today here in the emergency department was negative.  It appears that you have dysmenorrhea or painful periods.  Tylenol or a nonsteroidal medications such as Motrin or leave should help with symptoms.  Follow-up with your gynecologist if symptoms persist.   Dysmenorrhea Menstrual cramps (dysmenorrhea) are caused by the muscles of the uterus tightening (contracting) during a menstrual period. For some women, this discomfort is merely bothersome. For others, dysmenorrhea can be severe enough to interfere with everyday activities for a few days each month. Primary dysmenorrhea is menstrual cramps that last a couple of days when you start having menstrual periods or soon after. This often begins after a teenager starts having her period. As a woman gets older or has a baby, the cramps will usually lessen or disappear. Secondary dysmenorrhea begins later in life, lasts longer, and the pain may be stronger than primary dysmenorrhea. The pain may start before the period and last a few days after the period.  CAUSES  Dysmenorrhea is usually caused by an underlying problem, such as:  The tissue lining the uterus grows outside of the uterus in other areas of the body (endometriosis).  The endometrial tissue, which normally lines the uterus, is found in or grows into the muscular walls of the uterus (adenomyosis).  The pelvic blood vessels are engorged with blood just before the menstrual period (pelvic congestive syndrome).  Overgrowth of cells (polyps) in the lining of the uterus or cervix.  Falling down of the uterus (prolapse) because of loose or stretched ligaments.  Depression.  Bladder problems, infection, or inflammation.  Problems with the intestine, a tumor, or irritable bowel syndrome.  Cancer of the female organs or bladder.  A severely tipped uterus.  A very tight opening or closed cervix.  Noncancerous tumors of the uterus (fibroids).  Pelvic inflammatory  disease (PID).  Pelvic scarring (adhesions) from a previous surgery.  Ovarian cyst.  An intrauterine device (IUD) used for birth control. RISK FACTORS You may be at greater risk of dysmenorrhea if:  You are younger than age 66.  You started puberty early.  You have irregular or heavy bleeding.  You have never given birth.  You have a family history of this problem.  You are a smoker. SIGNS AND SYMPTOMS   Cramping or throbbing pain in your lower abdomen.  Headaches.  Lower back pain.  Nausea or vomiting.  Diarrhea.  Sweating or dizziness.  Loose stools. DIAGNOSIS  A diagnosis is based on your history, symptoms, physical exam, diagnostic tests, or procedures. Diagnostic tests or procedures may include:  Blood tests.  Ultrasonography.  An examination of the lining of the uterus (dilation and curettage, D&C).  An examination inside your abdomen or pelvis with a scope (laparoscopy).  X-rays.  CT scan.  MRI.  An examination inside the bladder with a scope (cystoscopy).  An examination inside the intestine or stomach with a scope (colonoscopy, gastroscopy). TREATMENT  Treatment depends on the cause of the dysmenorrhea. Treatment may include:  Pain medicine prescribed by your health care provider.  Birth control pills or an IUD with progesterone hormone in it.  Hormone replacement therapy.  Nonsteroidal anti-inflammatory drugs (NSAIDs). These may help stop the production of prostaglandins.  Surgery to remove adhesions, endometriosis, ovarian cyst, or fibroids.  Removal of the uterus (hysterectomy).  Progesterone shots to stop the menstrual period.  Cutting the nerves on the sacrum that go to the female organs (presacral neurectomy).  Electric current to the sacral nerves (sacral nerve  stimulation).  Antidepressant medicine.  Psychiatric therapy, counseling, or group therapy.  Exercise and physical therapy.  Meditation and yoga  therapy.  Acupuncture. HOME CARE INSTRUCTIONS   Only take over-the-counter or prescription medicines as directed by your health care provider.  Place a heating pad or hot water bottle on your lower back or abdomen. Do not sleep with the heating pad.  Use aerobic exercises, walking, swimming, biking, and other exercises to help lessen the cramping.  Massage to the lower back or abdomen may help.  Stop smoking.  Avoid alcohol and caffeine. SEEK MEDICAL CARE IF:   Your pain does not get better with medicine.  You have pain with sexual intercourse.  Your pain increases and is not controlled with medicines.  You have abnormal vaginal bleeding with your period.  You develop nausea or vomiting with your period that is not controlled with medicine. SEEK IMMEDIATE MEDICAL CARE IF:  You pass out.  Document Released: 11/25/2005 Document Revised: 07/28/2013 Document Reviewed: 05/13/2013 [facility] Patient Information 2015 [place], [place]. This information is not intended to replace advice given to you by your health care provider. Make sure you discuss any questions you have with your health care provider.

## 2015-01-27 NOTE — ED Notes (Signed)
Pt. reports vaginal spotting with low abdominal cramping and nausea onset yesterday , pt. states positive home pregnancy test yesterday ( G3P2) . Denies fever or chills . No emesis or diarrhea.

## 2015-01-27 NOTE — ED Notes (Addendum)
Patient is alert and orientedx4.  Patient was explained discharge instructions and they understood them with no questions.  The patient's boyfriend, Milon DikesJohn Pvague is taking the patient home.

## 2015-01-29 LAB — RPR: RPR Ser Ql: NONREACTIVE

## 2015-01-30 ENCOUNTER — Telehealth (HOSPITAL_BASED_OUTPATIENT_CLINIC_OR_DEPARTMENT_OTHER): Payer: Self-pay | Admitting: Emergency Medicine

## 2015-01-30 LAB — HIV ANTIBODY (ROUTINE TESTING W REFLEX): HIV Screen 4th Generation wRfx: NONREACTIVE

## 2015-01-30 NOTE — Telephone Encounter (Signed)
+   chlamydia, was not treated on 01/27/15 visit, chart handoff to EDP for treatment

## 2015-01-31 ENCOUNTER — Telehealth (HOSPITAL_BASED_OUTPATIENT_CLINIC_OR_DEPARTMENT_OTHER): Payer: Self-pay | Admitting: Emergency Medicine

## 2015-02-14 ENCOUNTER — Encounter (HOSPITAL_COMMUNITY): Payer: Self-pay | Admitting: Emergency Medicine

## 2015-02-14 ENCOUNTER — Emergency Department (HOSPITAL_COMMUNITY)
Admission: EM | Admit: 2015-02-14 | Discharge: 2015-02-14 | Disposition: A | Discharge status: Home / Self Care | Payer: Medicaid Other | Attending: Emergency Medicine | Admitting: Emergency Medicine

## 2015-02-14 DIAGNOSIS — S4992XA Unspecified injury of left shoulder and upper arm, initial encounter: Secondary | ICD-10-CM | POA: Diagnosis present

## 2015-02-14 DIAGNOSIS — Y92410 Unspecified street and highway as the place of occurrence of the external cause: Secondary | ICD-10-CM | POA: Insufficient documentation

## 2015-02-14 DIAGNOSIS — Z720 Tobacco use: Secondary | ICD-10-CM | POA: Diagnosis not present

## 2015-02-14 DIAGNOSIS — Y998 Other external cause status: Secondary | ICD-10-CM | POA: Diagnosis not present

## 2015-02-14 DIAGNOSIS — Y9389 Activity, other specified: Secondary | ICD-10-CM | POA: Diagnosis not present

## 2015-02-14 DIAGNOSIS — S199XXA Unspecified injury of neck, initial encounter: Secondary | ICD-10-CM | POA: Diagnosis not present

## 2015-02-14 DIAGNOSIS — M25512 Pain in left shoulder: Secondary | ICD-10-CM

## 2015-02-14 MED ORDER — NAPROXEN 500 MG PO TABS: 500.0000 mg | ORAL_TABLET | Freq: Two times a day (BID) | ORAL | Status: DC

## 2015-02-14 MED ORDER — METHOCARBAMOL 500 MG PO TABS: 500.0000 mg | ORAL_TABLET | Freq: Two times a day (BID) | ORAL | Status: DC | PRN
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
## 2015-03-01 ENCOUNTER — Encounter: Payer: Self-pay | Admitting: Certified Nurse Midwife

## 2015-03-01 ENCOUNTER — Ambulatory Visit (INDEPENDENT_AMBULATORY_CARE_PROVIDER_SITE_OTHER): Payer: Medicaid Other | Admitting: Certified Nurse Midwife

## 2015-03-01 ENCOUNTER — Other Ambulatory Visit: Payer: Self-pay | Admitting: Certified Nurse Midwife

## 2015-03-01 VITALS — BP 157/92 | HR 74 | Temp 98.0°F | Ht 67.0 in | Wt 122.0 lb

## 2015-03-01 DIAGNOSIS — Z0000 Encounter for general adult medical examination without abnormal findings: Secondary | ICD-10-CM

## 2015-03-01 DIAGNOSIS — N83202 Unspecified ovarian cyst, left side: Secondary | ICD-10-CM

## 2015-03-01 DIAGNOSIS — N921 Excessive and frequent menstruation with irregular cycle: Secondary | ICD-10-CM | POA: Insufficient documentation

## 2015-03-01 DIAGNOSIS — R630 Anorexia: Secondary | ICD-10-CM | POA: Diagnosis not present

## 2015-03-01 DIAGNOSIS — IMO0002 Reserved for concepts with insufficient information to code with codable children: Secondary | ICD-10-CM | POA: Insufficient documentation

## 2015-03-01 DIAGNOSIS — N939 Abnormal uterine and vaginal bleeding, unspecified: Secondary | ICD-10-CM

## 2015-03-01 DIAGNOSIS — N941 Dyspareunia: Secondary | ICD-10-CM

## 2015-03-01 DIAGNOSIS — L680 Hirsutism: Secondary | ICD-10-CM

## 2015-03-01 DIAGNOSIS — Z30018 Encounter for initial prescription of other contraceptives: Secondary | ICD-10-CM

## 2015-03-01 DIAGNOSIS — N8320 Unspecified ovarian cysts: Secondary | ICD-10-CM | POA: Diagnosis not present

## 2015-03-01 LAB — TSH: TSH: 1.546 u[IU]/mL (ref 0.350–4.500)

## 2015-03-01 MED ORDER — PRENATE RESTORE 27-0.6-0.4-400 MG PO CAPS: 1.0000 | ORAL_CAPSULE | Freq: Every day | ORAL | Status: DC

## 2015-03-01 MED ORDER — ETONOGESTREL-ETHINYL ESTRADIOL 0.12-0.015 MG/24HR VA RING: 1.0000 | VAGINAL_RING | VAGINAL | Status: DC
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
## 2015-03-02 LAB — TESTOSTERONE, FREE, TOTAL, SHBG
Sex Hormone Binding: 71 nmol/L (ref 17–124)
Testosterone, Free: 5.7 pg/mL (ref 0.6–6.8)
Testosterone-% Free: 1.1 % (ref 0.4–2.4)
Testosterone: 53 ng/dL (ref 10–70)

## 2015-03-02 LAB — HEMOGLOBIN A1C
Hgb A1c MFr Bld: 5.5 % (ref <5.7)
Mean Plasma Glucose: 111 mg/dL (ref <117)

## 2015-03-02 LAB — PAP IG W/ RFLX HPV ASCU

## 2015-03-02 LAB — HEPATITIS C ANTIBODY: HCV Ab: NEGATIVE

## 2015-03-02 LAB — CA 125: CA 125: 7 U/mL (ref <35)

## 2015-03-02 LAB — HIV ANTIBODY (ROUTINE TESTING W REFLEX): HIV 1&2 Ab, 4th Generation: NONREACTIVE

## 2015-03-02 LAB — PROLACTIN: Prolactin: 8.9 ng/mL

## 2015-03-02 LAB — RPR

## 2015-03-02 LAB — HEPATITIS B SURFACE ANTIGEN: Hepatitis B Surface Ag: NEGATIVE

## 2015-03-02 LAB — PROGESTERONE: Progesterone: 0.9 ng/mL

## 2015-03-04 LAB — SURESWAB, VAGINOSIS/VAGINITIS PLUS
Atopobium vaginae: 4.9 Log (cells/mL)
C. albicans, DNA: NOT DETECTED
C. glabrata, DNA: NOT DETECTED
C. parapsilosis, DNA: NOT DETECTED
C. trachomatis RNA, TMA: NOT DETECTED
C. tropicalis, DNA: NOT DETECTED
Gardnerella vaginalis: 7.1 Log (cells/mL)
LACTOBACILLUS SPECIES: NOT DETECTED Log (cells/mL)
MEGASPHAERA SPECIES: 7.3 Log (cells/mL)
N. gonorrhoeae RNA, TMA: NOT DETECTED
T. vaginalis RNA, QL TMA: NOT DETECTED

## 2015-03-07 ENCOUNTER — Other Ambulatory Visit: Payer: Self-pay | Admitting: Certified Nurse Midwife

## 2015-03-07 LAB — 17-HYDROXYPROGESTERONE: 17-OH-Progesterone, LC/MS/MS: 47 ng/dL

## 2015-03-08 ENCOUNTER — Ambulatory Visit (INDEPENDENT_AMBULATORY_CARE_PROVIDER_SITE_OTHER): Payer: Medicaid Other

## 2015-03-08 ENCOUNTER — Other Ambulatory Visit: Payer: Self-pay | Admitting: Certified Nurse Midwife

## 2015-03-08 DIAGNOSIS — N939 Abnormal uterine and vaginal bleeding, unspecified: Secondary | ICD-10-CM

## 2015-03-08 DIAGNOSIS — N83202 Unspecified ovarian cyst, left side: Secondary | ICD-10-CM

## 2015-03-08 DIAGNOSIS — N941 Unspecified dyspareunia: Secondary | ICD-10-CM

## 2015-03-08 DIAGNOSIS — N8320 Unspecified ovarian cysts: Secondary | ICD-10-CM | POA: Diagnosis not present

## 2015-03-09 ENCOUNTER — Ambulatory Visit: Payer: Medicaid Other | Admitting: *Deleted

## 2015-04-03 ENCOUNTER — Other Ambulatory Visit: Payer: Self-pay | Admitting: *Deleted

## 2015-04-03 DIAGNOSIS — B9689 Other specified bacterial agents as the cause of diseases classified elsewhere: Secondary | ICD-10-CM

## 2015-04-03 DIAGNOSIS — N760 Acute vaginitis: Principal | ICD-10-CM

## 2015-04-03 MED ORDER — METRONIDAZOLE 500 MG PO TABS: 500.0000 mg | ORAL_TABLET | Freq: Two times a day (BID) | ORAL | Status: DC

## 2015-04-03 NOTE — Progress Notes (Signed)
Rx for Metronidazole sent in for +BV. Left message for pt making her aware that Rx had been sent.

## 2015-04-22 ENCOUNTER — Emergency Department (HOSPITAL_COMMUNITY): Payer: Medicaid Other

## 2015-04-22 ENCOUNTER — Emergency Department (HOSPITAL_COMMUNITY)
Admission: EM | Admit: 2015-04-22 | Discharge: 2015-04-22 | Disposition: A | Discharge status: Home / Self Care | Payer: Medicaid Other | Attending: Emergency Medicine | Admitting: Emergency Medicine

## 2015-04-22 ENCOUNTER — Encounter (HOSPITAL_COMMUNITY): Payer: Self-pay | Admitting: Emergency Medicine

## 2015-04-22 DIAGNOSIS — N898 Other specified noninflammatory disorders of vagina: Secondary | ICD-10-CM | POA: Diagnosis not present

## 2015-04-22 DIAGNOSIS — R102 Pelvic and perineal pain: Secondary | ICD-10-CM | POA: Diagnosis not present

## 2015-04-22 DIAGNOSIS — Z3202 Encounter for pregnancy test, result negative: Secondary | ICD-10-CM | POA: Insufficient documentation

## 2015-04-22 DIAGNOSIS — Z79899 Other long term (current) drug therapy: Secondary | ICD-10-CM | POA: Insufficient documentation

## 2015-04-22 DIAGNOSIS — Z720 Tobacco use: Secondary | ICD-10-CM | POA: Insufficient documentation

## 2015-04-22 DIAGNOSIS — R109 Unspecified abdominal pain: Secondary | ICD-10-CM | POA: Diagnosis present

## 2015-04-22 LAB — CBC WITH DIFFERENTIAL/PLATELET
Basophils Absolute: 0 10*3/uL (ref 0.0–0.1)
Basophils Relative: 1 % (ref 0–1)
Eosinophils Absolute: 0.3 10*3/uL (ref 0.0–0.7)
Eosinophils Relative: 4 % (ref 0–5)
HCT: 39.2 % (ref 36.0–46.0)
Hemoglobin: 12.7 g/dL (ref 12.0–15.0)
Lymphocytes Relative: 49 % — ABNORMAL HIGH (ref 12–46)
Lymphs Abs: 3 10*3/uL (ref 0.7–4.0)
MCH: 26.7 pg (ref 26.0–34.0)
MCHC: 32.4 g/dL (ref 30.0–36.0)
MCV: 82.5 fL (ref 78.0–100.0)
Monocytes Absolute: 0.4 10*3/uL (ref 0.1–1.0)
Monocytes Relative: 6 % (ref 3–12)
Neutro Abs: 2.5 10*3/uL (ref 1.7–7.7)
Neutrophils Relative %: 40 % — ABNORMAL LOW (ref 43–77)
Platelets: 205 10*3/uL (ref 150–400)
RBC: 4.75 MIL/uL (ref 3.87–5.11)
RDW: 14.1 % (ref 11.5–15.5)
WBC: 6.2 10*3/uL (ref 4.0–10.5)

## 2015-04-22 LAB — BASIC METABOLIC PANEL
Anion gap: 8 (ref 5–15)
BUN: 9 mg/dL (ref 6–20)
CO2: 27 mmol/L (ref 22–32)
Calcium: 9 mg/dL (ref 8.9–10.3)
Chloride: 104 mmol/L (ref 101–111)
Creatinine, Ser: 0.82 mg/dL (ref 0.44–1.00)
GFR calc Af Amer: >60 mL/min (ref >60)
GFR calc non Af Amer: >60 mL/min (ref >60)
Glucose, Bld: 87 mg/dL (ref 65–99)
Potassium: 3.6 mmol/L (ref 3.5–5.1)
Sodium: 139 mmol/L (ref 135–145)

## 2015-04-22 LAB — WET PREP, GENITAL
Trich, Wet Prep: NONE SEEN
Yeast Wet Prep HPF POC: NONE SEEN

## 2015-04-22 LAB — URINALYSIS, ROUTINE W REFLEX MICROSCOPIC
Bilirubin Urine: NEGATIVE
Glucose, UA: NEGATIVE mg/dL
Hgb urine dipstick: NEGATIVE
Ketones, ur: 15 mg/dL — AB
Nitrite: NEGATIVE
Protein, ur: NEGATIVE mg/dL
Specific Gravity, Urine: 1.027 (ref 1.005–1.030)
Urobilinogen, UA: 1 mg/dL (ref 0.0–1.0)
pH: 6 (ref 5.0–8.0)

## 2015-04-22 LAB — URINE MICROSCOPIC-ADD ON

## 2015-04-22 LAB — POC URINE PREG, ED: Preg Test, Ur: NEGATIVE

## 2015-04-22 MED ORDER — OXYCODONE-ACETAMINOPHEN 5-325 MG PO TABS
1.0000 | ORAL_TABLET | Freq: Once | ORAL | Status: AC
  Administered 2015-04-22: 1 via ORAL
  Filled 2015-04-22: qty 1

## 2015-04-22 MED ORDER — METRONIDAZOLE 500 MG PO TABS: 500.0000 mg | ORAL_TABLET | Freq: Two times a day (BID) | ORAL | Status: DC

## 2015-04-22 MED ORDER — AZITHROMYCIN 250 MG PO TABS
1000.0000 mg | ORAL_TABLET | Freq: Once | ORAL | Status: AC
  Administered 2015-04-22: 1000 mg via ORAL
  Filled 2015-04-22: qty 4

## 2015-04-22 MED ORDER — DOXYCYCLINE HYCLATE 100 MG PO CAPS: 100.0000 mg | ORAL_CAPSULE | Freq: Two times a day (BID) | ORAL | Status: DC

## 2015-04-22 MED ORDER — CEFTRIAXONE SODIUM 250 MG IJ SOLR
250.0000 mg | Freq: Once | INTRAMUSCULAR | Status: AC
  Administered 2015-04-22: 250 mg via INTRAMUSCULAR
  Filled 2015-04-22: qty 250

## 2015-04-22 NOTE — Discharge Instructions (Signed)
Abdominal Pain, Women °Abdominal (stomach, pelvic, or belly) pain can be caused by many things. It is important to tell your doctor: °· The location of the pain. °· Does it come and go or is it present all the time? °· Are there things that start the pain (eating certain foods, exercise)? °· Are there other symptoms associated with the pain (fever, nausea, vomiting, diarrhea)? °All of this is helpful to know when trying to find the cause of the pain. °CAUSES  °· Stomach: virus or bacteria infection, or ulcer. °· Intestine: appendicitis (inflamed appendix), regional ileitis (Crohn's disease), ulcerative colitis (inflamed colon), irritable bowel syndrome, diverticulitis (inflamed diverticulum of the colon), or cancer of the stomach or intestine. °· Gallbladder disease or stones in the gallbladder. °· Kidney disease, kidney stones, or infection. °· Pancreas infection or cancer. °· Fibromyalgia (pain disorder). °· Diseases of the female organs: °¨ Uterus: fibroid (non-cancerous) tumors or infection. °¨ Fallopian tubes: infection or tubal pregnancy. °¨ Ovary: cysts or tumors. °¨ Pelvic adhesions (scar tissue). °¨ Endometriosis (uterus lining tissue growing in the pelvis and on the pelvic organs). °¨ Pelvic congestion syndrome (female organs filling up with blood just before the menstrual period). °¨ Pain with the menstrual period. °¨ Pain with ovulation (producing an egg). °¨ Pain with an IUD (intrauterine device, birth control) in the uterus. °¨ Cancer of the female organs. °· Functional pain (pain not caused by a disease, may improve without treatment). °· Psychological pain. °· Depression. °DIAGNOSIS  °Your doctor will decide the seriousness of your pain by doing an examination. °· Blood tests. °· X-rays. °· Ultrasound. °· CT scan (computed tomography, special type of X-ray). °· MRI (magnetic resonance imaging). °· Cultures, for infection. °· Barium enema (dye inserted in the large intestine, to better view it with  X-rays). °· Colonoscopy (looking in intestine with a lighted tube). °· Laparoscopy (minor surgery, looking in abdomen with a lighted tube). °· Major abdominal exploratory surgery (looking in abdomen with a large incision). °TREATMENT  °The treatment will depend on the cause of the pain.  °· Many cases can be observed and treated at home. °· Over-the-counter medicines recommended by your caregiver. °· Prescription medicine. °· Antibiotics, for infection. °· Birth control pills, for painful periods or for ovulation pain. °· Hormone treatment, for endometriosis. °· Nerve blocking injections. °· Physical therapy. °· Antidepressants. °· Counseling with a psychologist or psychiatrist. °· Minor or major surgery. °HOME CARE INSTRUCTIONS  °· Do not take laxatives, unless directed by your caregiver. °· Take over-the-counter pain medicine only if ordered by your caregiver. Do not take aspirin because it can cause an upset stomach or bleeding. °· Try a clear liquid diet (broth or water) as ordered by your caregiver. Slowly move to a bland diet, as tolerated, if the pain is related to the stomach or intestine. °· Have a thermometer and take your temperature several times a day, and record it. °· Bed rest and sleep, if it helps the pain. °· Avoid sexual intercourse, if it causes pain. °· Avoid stressful situations. °· Keep your follow-up appointments and tests, as your caregiver orders. °· If the pain does not go away with medicine or surgery, you may try: °¨ Acupuncture. °¨ Relaxation exercises (yoga, meditation). °¨ Group therapy. °¨ Counseling. °SEEK MEDICAL CARE IF:  °· You notice certain foods cause stomach pain. °· Your home care treatment is not helping your pain. °· You need stronger pain medicine. °· You want your IUD removed. °· You feel faint or   lightheaded. °· You develop nausea and vomiting. °· You develop a rash. °· You are having side effects or an allergy to your medicine. °SEEK IMMEDIATE MEDICAL CARE IF:  °· Your  pain does not go away or gets worse. °· You have a fever. °· Your pain is felt only in portions of the abdomen. The right side could possibly be appendicitis. The left lower portion of the abdomen could be colitis or diverticulitis. °· You are passing blood in your stools (bright red or black tarry stools, with or without vomiting). °· You have blood in your urine. °· You develop chills, with or without a fever. °· You pass out. °MAKE SURE YOU:  °· Understand these instructions. °· Will watch your condition. °· Will get help right away if you are not doing well or get worse. °Document Released: 09/22/2007 Document Revised: 04/11/2014 Document Reviewed: 10/12/2009 °[employer]® Patient Information ©2015 [employer]. This information is not intended to replace advice given to you by your health care provider. Make sure you discuss any questions you have with your health care provider. ° °

## 2015-04-22 NOTE — ED Notes (Signed)
Duplicate order discontinued.  

## 2015-04-22 NOTE — ED Notes (Signed)
Pt c/o lower abdominal pain, worsening on L side. Pt also reports nausea without vomiting. Has a known cyst on L ovary. Denies bleeding or discharge. Pt c/o dysuria and vaginal pain.

## 2015-04-22 NOTE — ED Notes (Signed)
Patient with history of ovarian cyst.  Patient having pain in her vagina and lower abdomin.  Patient does admit to nausea, no vomiting.  Patient states that the pain has been going on for awhile.

## 2015-04-22 NOTE — ED Provider Notes (Signed)
CSN: [id number]     Arrival date & time 04/22/15  0134 History  This chart was scribed for Mirian MoMatthew Ottis Sarnowski, MD by Evon Slackerrance Branch, ED Scribe. This patient was seen in room A03C/A03C and the patient's care was started at 2:20 AM.      Chief Complaint  Patient presents with  . Abdominal Pain    Patient is a 29 y.o. female presenting with abdominal pain. The history is provided by the patient. No language interpreter was used.  Abdominal Pain Pain quality: sharp   Pain severity:  Severe Onset quality:  Sudden Duration:  5 hours Timing:  Constant Progression:  Worsening Relieved by:  None tried Worsened by:  Nothing tried Ineffective treatments:  None tried Associated symptoms: dysuria   Associated symptoms: no cough, no fever, no vaginal bleeding and no vaginal discharge    HPI Comments: Angela Manning is a 29 y.o. female who presents to the Emergency Department complaining of sharp left sided abdominal pain onset 5 hours PTA. Pt rates the severity of her pain 10/10. Pt states this pain has been ongoing for some time now and recently worsened tonight. Pt states she has a Hx of cyst on her left ovary. Pt reports slight dysuria. She denies vaginal bleeding, vaginal discharge, cough or fever.  Past Medical History  Diagnosis Date  . Pregnancy induced hypertension    Past Surgical History  Procedure Laterality Date  . No past surgeries    . Dilation and evacuation N/A 02/21/2013    Procedure: DILATATION AND EVACUATION;  Surgeon: Reva Boresanya S Pratt, MD;  Location: [facility];  Service: Gynecology;  Laterality: N/A;   Family History  Problem Relation Age of Onset  . Hypertension Mother    History  Substance Use Topics  . Smoking status: Current Every Day Smoker -- 0.25 packs/day    Types: Cigarettes  . Smokeless tobacco: Former NeurosurgeonUser    Quit date: 01/08/2015  . Alcohol Use: No   OB History    Gravida Para Term Preterm AB TAB SAB Ectopic Multiple Living   4 4 3 1      4       Review of  Systems  Constitutional: Negative for fever.  Respiratory: Negative for cough.   Gastrointestinal: Positive for abdominal pain.  Genitourinary: Positive for dysuria. Negative for vaginal bleeding and vaginal discharge.  All other systems reviewed and are negative.    Allergies  Review of patient's allergies indicates no known allergies.  Home Medications   Prior to Admission medications   Medication Sig Start Date End Date Taking? Authorizing Provider  doxycycline (VIBRAMYCIN) 100 MG capsule Take 1 capsule (100 mg total) by mouth 2 (two) times daily. One po bid x 14 days 04/22/15   Mirian MoMatthew Keria Widrig, MD  etonogestrel-ethinyl estradiol (NUVARING) 0.12-0.015 MG/24HR vaginal ring Place 1 each vaginally every 21 ( twenty-one) days. Insert one (1) ring vaginally and leave in place for 3 weeks, then remove for 1 week. 03/01/15 02/21/16  Rachelle A Denney, CNM  metroNIDAZOLE (FLAGYL) 500 MG tablet Take 1 tablet (500 mg total) by mouth 2 (two) times daily. One po bid x 7 days 04/22/15   Mirian MoMatthew Calogero Geisen, MD  Prenat w/o A-FE-Methfol-FA-DHA (PRENATE RESTORE) 27-0.6-0.4-400 MG CAPS Take 1 tablet by mouth daily. 03/01/15   Rachelle A Denney, CNM   BP 151/98 mmHg  Pulse 53  Temp(Src) 98.6 F (37 C) (Oral)  Resp 16  SpO2 99%  LMP 03/26/2015 (Exact Date)   Physical Exam  Constitutional: She is  oriented to person, place, and time. She appears well-developed and well-nourished.  HENT:  Head: Normocephalic and atraumatic.  Right Ear: External ear normal.  Left Ear: External ear normal.  Eyes: Conjunctivae and EOM are normal. Pupils are equal, round, and reactive to light.  Neck: Normal range of motion. Neck supple.  Cardiovascular: Normal rate, regular rhythm, normal heart sounds and intact distal pulses.   Pulmonary/Chest: Effort normal and breath sounds normal.  Abdominal: Soft. Bowel sounds are normal. There is no tenderness.  Genitourinary: Cervix exhibits discharge (white) and friability. Cervix  exhibits no motion tenderness. Right adnexum displays no tenderness. Left adnexum displays tenderness.  Musculoskeletal: Normal range of motion.  Neurological: She is alert and oriented to person, place, and time.  Skin: Skin is warm and dry.  Vitals reviewed.   ED Course  Procedures (including critical care time) DIAGNOSTIC STUDIES: Oxygen Saturation is 99% on RA, normal by my interpretation.    COORDINATION OF CARE: 2:22 AM-Discussed treatment plan with pt at bedside and pt agreed to plan.     Labs Review Labs Reviewed  WET PREP, GENITAL - Abnormal; Notable for the following:    Clue Cells Wet Prep HPF POC TOO NUMEROUS TO COUNT (*)    WBC, Wet Prep HPF POC FEW (*)    All other components within normal limits  CBC WITH DIFFERENTIAL/PLATELET - Abnormal; Notable for the following:    Neutrophils Relative % 40 (*)    Lymphocytes Relative 49 (*)    All other components within normal limits  URINALYSIS, ROUTINE W REFLEX MICROSCOPIC - Abnormal; Notable for the following:    APPearance CLOUDY (*)    Ketones, ur 15 (*)    Leukocytes, UA TRACE (*)    All other components within normal limits  URINE MICROSCOPIC-ADD ON - Abnormal; Notable for the following:    Squamous Epithelial / LPF MANY (*)    All other components within normal limits  BASIC METABOLIC PANEL  POC URINE PREG, ED  GC/CHLAMYDIA PROBE AMP ([place])    Imaging Review No results found.   EKG Interpretation None      MDM   Final diagnoses:  Adnexal tenderness, left      29 y.o. female with pertinent PMH of chronic abd pain presents with recurrent acute on chronic pain.  Pt has planned fu for hysterectomy, but states she had worsening of pain.  Empirically treated for cervicitis, and will treat at home with doxy given possibility of PID.  US without torsion or other emergent pathology.  DC home in stable condition.    I have reviewed all laboratory and imaging studies if ordered as above  1. Adnexal  tenderness, left           Mirian MoMatthew Rasheen Bells, MD 04/24/15 [phone]

## 2015-04-22 NOTE — ED Notes (Signed)
MD at bedside. 

## 2015-04-24 LAB — GC/CHLAMYDIA PROBE AMP (~~LOC~~) NOT AT ARMC
Chlamydia: NEGATIVE
Neisseria Gonorrhea: NEGATIVE

## 2015-06-14 ENCOUNTER — Encounter (HOSPITAL_COMMUNITY): Payer: Self-pay | Admitting: *Deleted

## 2015-06-14 ENCOUNTER — Emergency Department (INDEPENDENT_AMBULATORY_CARE_PROVIDER_SITE_OTHER)
Admission: EM | Admit: 2015-06-14 | Discharge: 2015-06-14 | Disposition: A | Discharge status: Home / Self Care | Payer: Medicaid Other | Source: Home / Self Care | Attending: Family Medicine | Admitting: Family Medicine

## 2015-06-14 ENCOUNTER — Other Ambulatory Visit (HOSPITAL_COMMUNITY)
Admission: RE | Admit: 2015-06-14 | Discharge: 2015-06-14 | Disposition: A | Discharge status: Home / Self Care | Payer: Medicaid Other | Source: Ambulatory Visit | Attending: Family Medicine | Admitting: Family Medicine

## 2015-06-14 DIAGNOSIS — Z113 Encounter for screening for infections with a predominantly sexual mode of transmission: Secondary | ICD-10-CM | POA: Insufficient documentation

## 2015-06-14 DIAGNOSIS — N760 Acute vaginitis: Secondary | ICD-10-CM | POA: Diagnosis not present

## 2015-06-14 DIAGNOSIS — B9689 Other specified bacterial agents as the cause of diseases classified elsewhere: Secondary | ICD-10-CM

## 2015-06-14 DIAGNOSIS — A499 Bacterial infection, unspecified: Secondary | ICD-10-CM | POA: Diagnosis not present

## 2015-06-14 LAB — POCT URINALYSIS DIP (DEVICE)
Bilirubin Urine: NEGATIVE
Glucose, UA: NEGATIVE mg/dL
Hgb urine dipstick: NEGATIVE
Ketones, ur: NEGATIVE mg/dL
Leukocytes, UA: NEGATIVE
Nitrite: NEGATIVE
Protein, ur: NEGATIVE mg/dL
Specific Gravity, Urine: >=1.03 (ref 1.005–1.030)
Urobilinogen, UA: 0.2 mg/dL (ref 0.0–1.0)
pH: 6.5 (ref 5.0–8.0)

## 2015-06-14 LAB — POCT PREGNANCY, URINE: Preg Test, Ur: NEGATIVE

## 2015-06-14 MED ORDER — METRONIDAZOLE 500 MG PO TABS: 500.0000 mg | ORAL_TABLET | Freq: Two times a day (BID) | ORAL | Status: DC

## 2015-06-14 NOTE — ED Notes (Signed)
Pt  Reports  Symptoms  Of  Vaginal  Discharge     Since  Yesterday     Ambulated  To room    With a  Steady  Fluid  Gait            bp  Is  Elevated  But  Did not take    meds  today

## 2015-06-14 NOTE — ED Provider Notes (Signed)
CSN: [id number]     Arrival date & time 06/14/15  1512 History   First MD Initiated Contact with Patient 06/14/15 1641     Chief Complaint  Patient presents with  . Vaginal Discharge   (Consider location/radiation/quality/duration/timing/severity/associated sxs/prior Treatment) Patient is a 29 y.o. female presenting with vaginal discharge. The history is provided by the patient.  Vaginal Discharge Quality:  Malodorous and white Onset quality:  Gradual Duration:  2 days Progression:  Unchanged Chronicity:  Chronic Relieved by:  None tried Worsened by:  Nothing tried Ineffective treatments:  None tried Associated symptoms: no abdominal pain, no fever, no genital lesions and no vaginal itching   Risk factors: no STI exposure     Past Medical History  Diagnosis Date  . Pregnancy induced hypertension    Past Surgical History  Procedure Laterality Date  . No past surgeries    . Dilation and evacuation N/A 02/21/2013    Procedure: DILATATION AND EVACUATION;  Surgeon: Reva Bores, MD;  Location: [facility];  Service: Gynecology;  Laterality: N/A;   Family History  Problem Relation Age of Onset  . Hypertension Mother    History  Substance Use Topics  . Smoking status: Current Every Day Smoker -- 0.25 packs/day    Types: Cigarettes  . Smokeless tobacco: Former Neurosurgeon    Quit date: 01/08/2015  . Alcohol Use: No   OB History    Gravida Para Term Preterm AB TAB SAB Ectopic Multiple Living   Review of Systems  Constitutional: Negative.  Negative for fever.  Gastrointestinal: Negative.  Negative for abdominal pain.  Genitourinary: Positive for vaginal discharge. Negative for vaginal bleeding, menstrual problem and pelvic pain.    Allergies  Review of patient's allergies indicates no known allergies.  Home Medications   Prior to Admission medications   Medication Sig Start Date End Date Taking? Authorizing Provider  doxycycline (VIBRAMYCIN) 100 MG capsule  Take 1 capsule (100 mg total) by mouth 2 (two) times daily. One po bid x 14 days 04/22/15   Mirian Mo, MD  etonogestrel-ethinyl estradiol (NUVARING) 0.12-0.015 MG/24HR vaginal ring Place 1 each vaginally every 21 ( twenty-one) days. Insert one (1) ring vaginally and leave in place for 3 weeks, then remove for 1 week. 03/01/15 02/21/16  Rachelle A Denney, CNM  metroNIDAZOLE (FLAGYL) 500 MG tablet Take 1 tablet (500 mg total) by mouth 2 (two) times daily. 06/14/15   Linna Hoff, MD  Prenat w/o A-FE-Methfol-FA-DHA (PRENATE RESTORE) 27-0.6-0.4-400 MG CAPS Take 1 tablet by mouth daily. 03/01/15   Rachelle A Denney, CNM   BP 158/120 mmHg  Pulse 70  Temp(Src) 98 F (36.7 C) (Oral)  Resp 16  SpO2 100%  LMP 05/23/2015 Physical Exam  Constitutional: She is oriented to person, place, and time. She appears well-developed and well-nourished. No distress.  Abdominal: Soft. Bowel sounds are normal.  Genitourinary: Uterus normal. There is no tenderness on the right labia. There is no tenderness on the left labia. Cervix exhibits discharge. Cervix exhibits no motion tenderness. Right adnexum displays no mass and no tenderness. Left adnexum displays no mass and no tenderness. No tenderness in the vagina. Vaginal discharge found.    Neurological: She is alert and oriented to person, place, and time.  Skin: Skin is warm and dry.  Nursing note and vitals reviewed.   ED Course  Procedures (including critical care time) Labs Review Labs Reviewed  CERVICOVAGINAL ANCILLARY  ONLY    Imaging Review No results found.   MDM   1. BV (bacterial vaginosis)        Linna HoffJames D Evonda Enge, MD 06/14/15 1719

## 2015-06-15 LAB — CERVICOVAGINAL ANCILLARY ONLY
Chlamydia: NEGATIVE
Neisseria Gonorrhea: NEGATIVE
Wet Prep (BD Affirm): POSITIVE — AB

## 2015-06-15 NOTE — ED Notes (Signed)
Final report of STD screening negative. Wet prep positive for gardnerella. Treatment [facility] w [facility] No further action required

## 2015-07-07 ENCOUNTER — Encounter (HOSPITAL_COMMUNITY): Payer: Self-pay | Admitting: Emergency Medicine

## 2015-07-07 ENCOUNTER — Emergency Department (INDEPENDENT_AMBULATORY_CARE_PROVIDER_SITE_OTHER)
Admission: EM | Admit: 2015-07-07 | Discharge: 2015-07-07 | Disposition: A | Discharge status: Home / Self Care | Payer: Medicaid Other | Source: Home / Self Care | Attending: Family Medicine | Admitting: Family Medicine

## 2015-07-07 DIAGNOSIS — I10 Essential (primary) hypertension: Secondary | ICD-10-CM

## 2015-07-07 DIAGNOSIS — Z889 Allergy status to unspecified drugs, medicaments and biological substances status: Secondary | ICD-10-CM

## 2015-07-07 DIAGNOSIS — T7840XA Allergy, unspecified, initial encounter: Secondary | ICD-10-CM

## 2015-07-07 MED ORDER — CLINDAMYCIN HCL 300 MG PO CAPS: 300.0000 mg | ORAL_CAPSULE | Freq: Three times a day (TID) | ORAL | Status: DC

## 2015-07-07 NOTE — ED Notes (Signed)
Pt reports poss allergic reaction to Flagyl that was given to her on 7/6 Reports she broke out w/a rash and so she stopped med Still having vag d/c  Alert, no signs of acute distress.

## 2015-07-07 NOTE — ED Provider Notes (Signed)
CSN: [id number]     Arrival date & time 07/07/15  1407 History   First MD Initiated Contact with Patient 07/07/15 1552     Chief Complaint  Patient presents with  . Allergic Reaction   (Consider location/radiation/quality/duration/timing/severity/associated sxs/prior Treatment) HPI  Diagnosed with BV on 06/14/2015. Start on metronidazole. Patient took 3 days of the medicines and then developed a diffuse itchy macular rash. States that she thought that this may have been due to the medicine or new soap that she start using so she stopped both of them. Restart bisoprolol problem. Restarted the metronidazole for 1 more day and broke out in a worse rash all over. Benadryl with some improvement in the rash. But ultimately resolved after no longer taking the medicine. Patient's vaginal discharge initially started to improve but has persisted ever since she stopped the metronidazole. Denies abdominal pain, dysuria, frequency, shortness breath, palpitations.   Past Medical History  Diagnosis Date  . Pregnancy induced hypertension    Past Surgical History  Procedure Laterality Date  . No past surgeries    . Dilation and evacuation N/A 02/21/2013    Procedure: DILATATION AND EVACUATION;  Surgeon: Reva Bores, MD;  Location: [facility];  Service: Gynecology;  Laterality: N/A;   Family History  Problem Relation Age of Onset  . Hypertension Mother    History  Substance Use Topics  . Smoking status: Current Every Day Smoker -- 0.25 packs/day    Types: Cigarettes  . Smokeless tobacco: Former Neurosurgeon    Quit date: 01/08/2015  . Alcohol Use: No   OB History    Gravida Para Term Preterm AB TAB SAB Ectopic Multiple Living   Review of Systems  Allergies  Review of patient's allergies indicates no known allergies.  Home Medications   Prior to Admission medications   Medication Sig Start Date End Date Taking? Authorizing Provider  clindamycin (CLEOCIN) 300 MG capsule Take 1  capsule (300 mg total) by mouth 3 (three) times daily. 07/07/15   Ozella Rocks, MD  doxycycline (VIBRAMYCIN) 100 MG capsule Take 1 capsule (100 mg total) by mouth 2 (two) times daily. One po bid x 14 days 04/22/15   Mirian Mo, MD  etonogestrel-ethinyl estradiol (NUVARING) 0.12-0.015 MG/24HR vaginal ring Place 1 each vaginally every 21 ( twenty-one) days. Insert one (1) ring vaginally and leave in place for 3 weeks, then remove for 1 week. 03/01/15 02/21/16  Rachelle A Denney, CNM  Prenat w/o A-FE-Methfol-FA-DHA (PRENATE RESTORE) 27-0.6-0.4-400 MG CAPS Take 1 tablet by mouth daily. 03/01/15   Rachelle A Denney, CNM   BP 158/107 mmHg  Pulse 76  Temp(Src) 98.5 F (36.9 C) (Oral)  Resp 18  SpO2 100%  LMP 05/23/2015 Physical Exam Physical Exam  Constitutional: oriented to person, place, and time. appears well-developed and well-nourished. No distress.  HENT:  Head: Normocephalic and atraumatic.  Eyes: EOMI. PERRL.  Neck: Normal range of motion.  Cardiovascular: RRR, no m/r/g, 2+ distal pulses,  Pulmonary/Chest: Effort normal and breath sounds normal. No respiratory distress.  Abdominal: Soft. Bowel sounds are normal. NonTTP, no distension.  Musculoskeletal: Normal range of motion. Non ttp, no effusion.  Neurological: alert and oriented to person, place, and time.  Skin: Skin is warm. No rash noted. non diaphoretic.  Psychiatric: normal mood and affect. behavior is normal. Judgment and thought content normal.   ED Course  Procedures (including critical care time) Labs Review Labs  Reviewed - No data to display  Imaging Review No results found.   MDM   1. Allergic reaction caused by a drug   2. Essential hypertension    Correlation blood pressure noted. Follow-up with PCP. Stop metronidazole and start clindamycin.    Ozella Rocks, MD 07/07/15 [phone]

## 2015-07-07 NOTE — Discharge Instructions (Signed)
Postoperative metronidazole. Please start the clindamycin.

## 2015-08-09 ENCOUNTER — Encounter (HOSPITAL_COMMUNITY): Payer: Self-pay | Admitting: Emergency Medicine

## 2015-08-09 ENCOUNTER — Other Ambulatory Visit (HOSPITAL_COMMUNITY)
Admission: RE | Admit: 2015-08-09 | Discharge: 2015-08-09 | Disposition: A | Discharge status: Home / Self Care | Payer: Self-pay | Source: Ambulatory Visit | Attending: Physician Assistant | Admitting: Physician Assistant

## 2015-08-09 ENCOUNTER — Emergency Department (INDEPENDENT_AMBULATORY_CARE_PROVIDER_SITE_OTHER)
Admission: EM | Admit: 2015-08-09 | Discharge: 2015-08-09 | Disposition: A | Discharge status: Home / Self Care | Payer: Self-pay | Source: Home / Self Care

## 2015-08-09 DIAGNOSIS — N898 Other specified noninflammatory disorders of vagina: Secondary | ICD-10-CM

## 2015-08-09 DIAGNOSIS — N760 Acute vaginitis: Secondary | ICD-10-CM | POA: Insufficient documentation

## 2015-08-09 MED ORDER — CLINDAMYCIN HCL 300 MG PO CAPS: 300.0000 mg | ORAL_CAPSULE | Freq: Three times a day (TID) | ORAL | Status: DC

## 2015-08-09 NOTE — ED Provider Notes (Signed)
CSN: [id number]     Arrival date & time 08/09/15  1507 History   None    Chief Complaint  Patient presents with  . Vaginal Discharge    HPI  19 yof presents with vaginal dc.  Similar sx last month, diag with BV and tx with flagyl. Had rash from this so changed to clinda. Returns today stating dc resolved for several wks but similar dc started back last week. Denies odor. Denies dyspareunia. Denies dysuria, fevers, chills.   Past Medical History  Diagnosis Date  . Pregnancy induced hypertension    Past Surgical History  Procedure Laterality Date  . No past surgeries    . Dilation and evacuation N/A 02/21/2013    Procedure: DILATATION AND EVACUATION;  Surgeon: Reva Bores, MD;  Location: [facility];  Service: Gynecology;  Laterality: N/A;   Family History  Problem Relation Age of Onset  . Hypertension Mother    Social History  Substance Use Topics  . Smoking status: Current Every Day Smoker -- 0.25 packs/day    Types: Cigarettes  . Smokeless tobacco: Former Neurosurgeon    Quit date: 01/08/2015  . Alcohol Use: No   OB History    Gravida Para Term Preterm AB TAB SAB Ectopic Multiple Living   Review of Systems See HPI  Allergies  Review of patient's allergies indicates no known allergies.  Home Medications   Prior to Admission medications   Medication Sig Start Date End Date Taking? Authorizing Provider  clindamycin (CLEOCIN) 300 MG capsule Take 1 capsule (300 mg total) by mouth 3 (three) times daily. 08/09/15   Raelyn Ensign, PA  doxycycline (VIBRAMYCIN) 100 MG capsule Take 1 capsule (100 mg total) by mouth 2 (two) times daily. One po bid x 14 days Patient not taking: Reported on 08/09/2015 04/22/15   Mirian Mo, MD  etonogestrel-ethinyl estradiol (NUVARING) 0.12-0.015 MG/24HR vaginal ring Place 1 each vaginally every 21 ( twenty-one) days. Insert one (1) ring vaginally and leave in place for 3 weeks, then remove for 1 week. 03/01/15 02/21/16  Rachelle A Denney,  CNM  Prenat w/o A-FE-Methfol-FA-DHA (PRENATE RESTORE) 27-0.6-0.4-400 MG CAPS Take 1 tablet by mouth daily. 03/01/15   Roe Coombs, CNM   Meds Ordered and Administered this Visit  Medications - No data to display  BP 152/84 mmHg  Pulse 86  Temp(Src) 98.1 F (36.7 C) (Oral)  Resp 18  SpO2 97%  LMP 07/23/2015 No data found.   Physical Exam  Constitutional: She is oriented to person, place, and time.  Genitourinary:  Declines vaginal exam. Pt performed self wet prep swab.   Neurological: She is alert and oriented to person, place, and time.  Psychiatric: She has a normal mood and affect. Her speech is normal and behavior is normal.    ED Course  Procedures (including critical care time)  Labs Review Labs Reviewed  CERVICOVAGINAL ANCILLARY ONLY   Imaging Review No results found.  MDM   1. Vaginal discharge    Treating preemptively with clinda as had rash with flagyl last month. Wet prep sample sent. Will f/u results.     Raelyn Ensign, [place] 08/09/15 [phone]

## 2015-08-09 NOTE — ED Notes (Signed)
Patient provided affirm/vaginal swab.  Labeled at bedside by this nurse and placed in lab

## 2015-08-09 NOTE — Discharge Instructions (Signed)
Please take the clindamycin three times daily for 7 days. It's a good idea to take probiotics like eating yogurt daily while taking this medication.  We'll let you know the results of the wet prep in a few days.

## 2015-08-09 NOTE — ED Notes (Signed)
Patient reports bacterial vaginosis and history of the same.

## 2015-08-11 LAB — CERVICOVAGINAL ANCILLARY ONLY: Wet Prep (BD Affirm): POSITIVE — AB

## 2015-08-22 NOTE — ED Notes (Signed)
Attempted to contact patient to discuss lab results. Number listed as home number has been disconnected, cell number mailbox is full. Will attempt to contact later

## 2015-09-07 ENCOUNTER — Emergency Department (INDEPENDENT_AMBULATORY_CARE_PROVIDER_SITE_OTHER)
Admission: EM | Admit: 2015-09-07 | Discharge: 2015-09-07 | Disposition: A | Discharge status: Home / Self Care | Payer: Self-pay | Source: Home / Self Care | Attending: Family Medicine | Admitting: Family Medicine

## 2015-09-07 ENCOUNTER — Encounter (HOSPITAL_COMMUNITY): Payer: Self-pay | Admitting: Emergency Medicine

## 2015-09-07 DIAGNOSIS — K529 Noninfective gastroenteritis and colitis, unspecified: Secondary | ICD-10-CM

## 2015-09-07 DIAGNOSIS — I10 Essential (primary) hypertension: Secondary | ICD-10-CM

## 2015-09-07 MED ORDER — ONDANSETRON HCL 4 MG PO TABS: 4.0000 mg | ORAL_TABLET | Freq: Three times a day (TID) | ORAL | Status: DC | PRN

## 2015-09-07 NOTE — ED Provider Notes (Signed)
CSN: [id number]     Arrival date & time 09/07/15  1528 History   First MD Initiated Contact with Patient 09/07/15 1631     No chief complaint on file.  (Consider location/radiation/quality/duration/timing/severity/associated sxs/prior Treatment) HPI Comments: 29 year old female complaining of nausea, vomiting and diarrhea since yesterday evening. She has vomited up to 5 times and diarrhea too numerous to count. She states her diarrhea is watery and no blood has been seen. Denies abdominal pain or cramps. She does feel weak. Denies fever or chills.   Past Medical History  Diagnosis Date  . Pregnancy induced hypertension    Past Surgical History  Procedure Laterality Date  . No past surgeries    . Dilation and evacuation N/A 02/21/2013    Procedure: DILATATION AND EVACUATION;  Surgeon: Reva Bores, MD;  Location: [facility];  Service: Gynecology;  Laterality: N/A;   Family History  Problem Relation Age of Onset  . Hypertension Mother    Social History  Substance Use Topics  . Smoking status: Current Every Day Smoker -- 0.25 packs/day    Types: Cigarettes  . Smokeless tobacco: Former Neurosurgeon    Quit date: 01/08/2015  . Alcohol Use: No   OB History    Gravida Para Term Preterm AB TAB SAB Ectopic Multiple Living   Review of Systems  Constitutional: Positive for activity change. Negative for fever and fatigue.  HENT: Negative.   Respiratory: Negative.   Cardiovascular: Negative for chest pain.  Gastrointestinal: Positive for nausea, vomiting and diarrhea. Negative for abdominal pain, constipation, blood in stool, abdominal distention and anal bleeding.  Genitourinary: Negative.   Musculoskeletal: Negative.   Skin: Negative for rash.  Neurological: Positive for weakness.    Allergies  Review of patient's allergies indicates no known allergies.  Home Medications   Prior to Admission medications   Medication Sig Start Date End Date Taking? Authorizing  Provider  etonogestrel-ethinyl estradiol (NUVARING) 0.12-0.015 MG/24HR vaginal ring Place 1 each vaginally every 21 ( twenty-one) days. Insert one (1) ring vaginally and leave in place for 3 weeks, then remove for 1 week. 03/01/15 02/21/16  Rachelle A Denney, CNM  ondansetron (ZOFRAN) 4 MG tablet Take 1 tablet (4 mg total) by mouth every 8 (eight) hours as needed for nausea or vomiting. 09/07/15   Hayden Rasmussen, NP  Prenat w/o A-FE-Methfol-FA-DHA (PRENATE RESTORE) 27-0.6-0.4-400 MG CAPS Take 1 tablet by mouth daily. 03/01/15   Roe Coombs, CNM   Meds Ordered and Administered this Visit  Medications - No data to display  Temp(Src) 98.8 F (37.1 C) (Oral)  SpO2 100%  LMP 07/23/2015 Orthostatic VS for the past 24 hrs:  BP- Lying Pulse- Lying BP- Sitting Pulse- Sitting BP- Standing at 0 minutes Pulse- Standing at 0 minutes  09/07/15 1647 (!) 156/104 mmHg 73 (!) 156/104 mmHg 86 (!) 158/111 mmHg 84    Physical Exam  Constitutional: She is oriented to person, place, and time. She appears well-developed and well-nourished. No distress.  Eyes: EOM are normal.  Neck: Normal range of motion. Neck supple.  Cardiovascular: Normal rate, regular rhythm and normal heart sounds.   Pulmonary/Chest: Effort normal. No respiratory distress. She has no wheezes. She has no rales.  Abdominal: Soft. Bowel sounds are normal. She exhibits no distension and no mass. There is no tenderness. There is no rebound and no guarding.  Musculoskeletal: She exhibits no edema.  Neurological: She is alert and oriented  to person, place, and time. She exhibits normal muscle tone.  Skin: Skin is warm and dry.  Psychiatric: She has a normal mood and affect.  Nursing note and vitals reviewed.   ED Course  Procedures (including critical care time)  Labs Review Labs Reviewed - No data to display  Imaging Review No results found.   Visual Acuity Review  Right Eye Distance:   Left Eye Distance:   Bilateral Distance:     Right Eye Near:   Left Eye Near:    Bilateral Near:         MDM   1. Gastroenteritis   2. Essential hypertension    Zofran 4 mg every 6 hours when necessary nausea and vomiting Clear liquids in small but frequent amounts. Slightly advanced diet as tolerated and increase your fluid intake. Recommend PD light or Gatorade. No solid foods for 24 hours. Imodium A-D, may take 1 when you get home. If still having greater than 4 diarrhea stools in the following 6 hours may take 1 more. Do not take this medication based on the instructions written on the box. Do not stop your diarrhea with this medication it is primarily used to slow it down. Instructed on hypertension and the need to follow-up with PCP for management.    Hayden Rasmussen, NP 09/07/15 1715

## 2015-09-07 NOTE — ED Notes (Signed)
Patient reports vomiting since yesterday

## 2015-09-07 NOTE — Discharge Instructions (Signed)
Hypertension °Hypertension, commonly called high blood pressure, is when the force of blood pumping through your arteries is too strong. Your arteries are the blood vessels that carry blood from your heart throughout your body. A blood pressure reading consists of a higher number over a lower number, such as 110/72. The higher number (systolic) is the pressure inside your arteries when your heart pumps. The lower number (diastolic) is the pressure inside your arteries when your heart relaxes. Ideally you want your blood pressure below 120/80. °Hypertension forces your heart to work harder to pump blood. Your arteries may become narrow or stiff. Having hypertension puts you at risk for heart disease, stroke, and other problems.  °RISK FACTORS °Some risk factors for high blood pressure are controllable. Others are not.  °Risk factors you cannot control include:  °· Race. You may be at higher risk if you are African American. °· Age. Risk increases with age. °· Gender. Men are at higher risk than women before age 45 years. After age 65, women are at higher risk than men. °Risk factors you can control include: °· Not getting enough exercise or physical activity. °· Being overweight. °· Getting too much fat, sugar, calories, or salt in your diet. °· Drinking too much alcohol. °SIGNS AND SYMPTOMS °Hypertension does not usually cause signs or symptoms. Extremely high blood pressure (hypertensive crisis) may cause headache, anxiety, shortness of breath, and nosebleed. °DIAGNOSIS  °To check if you have hypertension, your health care provider will measure your blood pressure while you are seated, with your arm held at the level of your heart. It should be measured at least twice using the same arm. Certain conditions can cause a difference in blood pressure between your right and left arms. A blood pressure reading that is higher than normal on one occasion does not mean that you need treatment. If one blood pressure reading  is high, ask your health care provider about having it checked again. °TREATMENT  °Treating high blood pressure includes making lifestyle changes and possibly taking medicine. Living a healthy lifestyle can help lower high blood pressure. You may need to change some of your habits. °Lifestyle changes may include: °· Following the DASH diet. This diet is high in fruits, vegetables, and whole grains. It is low in salt, red meat, and added sugars. °· Getting at least 2½ hours of brisk physical activity every week. °· Losing weight if necessary. °· Not smoking. °· Limiting alcoholic beverages. °· Learning ways to reduce stress. ° If lifestyle changes are not enough to get your blood pressure under control, your health care provider may prescribe medicine. You may need to take more than one. Work closely with your health care provider to understand the risks and benefits. °HOME CARE INSTRUCTIONS °· Have your blood pressure rechecked as directed by your health care provider.   °· Take medicines only as directed by your health care provider. Follow the directions carefully. Blood pressure medicines must be taken as prescribed. The medicine does not work as well when you skip doses. Skipping doses also puts you at risk for problems.   °· Do not smoke.   °· Monitor your blood pressure at home as directed by your health care provider.  °SEEK MEDICAL CARE IF:  °· You think you are having a reaction to medicines taken. °· You have recurrent headaches or feel dizzy. °· You have swelling in your ankles. °· You have trouble with your vision. °SEEK IMMEDIATE MEDICAL CARE IF: °· You develop a severe headache or confusion. °·   You have unusual weakness, numbness, or feel faint.  You have severe chest or abdominal pain.  You vomit repeatedly.  You have trouble breathing. MAKE SURE YOU:   Understand these instructions.  Will watch your condition.  Will get help right away if you are not doing well or get worse. Document  Released: 11/25/2005 Document Revised: 04/11/2014 Document Reviewed: 09/17/2013 [employer] Patient Information 2015 [place], [place]. This information is not intended to replace advice given to you by your health care provider. Make sure you discuss any questions you have with your health care provider.  Viral Gastroenteritis Viral gastroenteritis is also known as stomach flu. This condition affects the stomach and intestinal tract. It can cause sudden diarrhea and vomiting. The illness typically lasts 3 to 8 days. Most people develop an immune response that eventually gets rid of the virus. While this natural response develops, the virus can make you quite ill. CAUSES  Many different viruses can cause gastroenteritis, such as rotavirus or noroviruses. You can catch one of these viruses by consuming contaminated food or water. You may also catch a virus by sharing utensils or other personal items with an infected person or by touching a contaminated surface. SYMPTOMS  The most common symptoms are diarrhea and vomiting. These problems can cause a severe loss of body fluids (dehydration) and a body salt (electrolyte) imbalance. Other symptoms may include:  Fever.  Headache.  Fatigue.  Abdominal pain. DIAGNOSIS  Your caregiver can usually diagnose viral gastroenteritis based on your symptoms and a physical exam. A stool sample may also be taken to test for the presence of viruses or other infections. TREATMENT  This illness typically goes away on its own. Treatments are aimed at rehydration. The most serious cases of viral gastroenteritis involve vomiting so severely that you are not able to keep fluids down. In these cases, fluids must be given through an intravenous line (IV). HOME CARE INSTRUCTIONS   Drink enough fluids to keep your urine clear or pale yellow. Drink small amounts of fluids frequently and increase the amounts as tolerated.  Ask your caregiver for specific rehydration  instructions.  Avoid:  Foods high in sugar.  Alcohol.  Carbonated drinks.  Tobacco.  Juice.  Caffeine drinks.  Extremely hot or cold fluids.  Fatty, greasy foods.  Too much intake of anything at one time.  Dairy products until 24 to 48 hours after diarrhea stops.  You may consume probiotics. Probiotics are active cultures of beneficial bacteria. They may lessen the amount and number of diarrheal stools in adults. Probiotics can be found in yogurt with active cultures and in supplements.  Wash your hands well to avoid spreading the virus.  Only take over-the-counter or prescription medicines for pain, discomfort, or fever as directed by your caregiver. Do not give aspirin to children. Antidiarrheal medicines are not recommended.  Ask your caregiver if you should continue to take your regular prescribed and over-the-counter medicines.  Keep all follow-up appointments as directed by your caregiver. SEEK IMMEDIATE MEDICAL CARE IF:   You are unable to keep fluids down.  You do not urinate at least once every 6 to 8 hours.  You develop shortness of breath.  You notice blood in your stool or vomit. This may look like coffee grounds.  You have abdominal pain that increases or is concentrated in one small area (localized).  You have persistent vomiting or diarrhea.  You have a fever.  The patient is a child younger than 3 months, and he  or she has a fever.  The patient is a child older than 3 months, and he or she has a fever and persistent symptoms.  The patient is a child older than 3 months, and he or she has a fever and symptoms suddenly get worse.  The patient is a baby, and he or she has no tears when crying. MAKE SURE YOU:   Understand these instructions.  Will watch your condition.  Will get help right away if you are not doing well or get worse. Document Released: 11/25/2005 Document Revised: 02/17/2012 Document Reviewed: 09/11/2011 [facility] Patient  Information 2015 [place], [place]. This information is not intended to replace advice given to you by your health care provider. Make sure you discuss any questions you have with your health care provider.  Managing Your High Blood Pressure Blood pressure is a measurement of how forceful your blood is pressing against the walls of the arteries. Arteries are muscular tubes within the circulatory system. Blood pressure does not stay the same. Blood pressure rises when you are active, excited, or nervous; and it lowers during sleep and relaxation. If the numbers measuring your blood pressure stay above normal most of the time, you are at risk for health problems. High blood pressure (hypertension) is a long-term (chronic) condition in which blood pressure is elevated. A blood pressure reading is recorded as two numbers, such as 120 over 80 (or 120/80). The first, higher number is called the systolic pressure. It is a measure of the pressure in your arteries as the heart beats. The second, lower number is called the diastolic pressure. It is a measure of the pressure in your arteries as the heart relaxes between beats.  Keeping your blood pressure in a normal range is important to your overall health and prevention of health problems, such as heart disease and stroke. When your blood pressure is uncontrolled, your heart has to work harder than normal. High blood pressure is a very common condition in adults because blood pressure tends to rise with age. Men and women are equally likely to have hypertension but at different times in life. Before age 73, men are more likely to have hypertension. After 28 years of age, women are more likely to have it. Hypertension is especially common in African Americans. This condition often has no signs or symptoms. The cause of the condition is usually not known. Your caregiver can help you come up with a plan to keep your blood pressure in a normal, healthy range. BLOOD PRESSURE  STAGES Blood pressure is classified into four stages: normal, prehypertension, stage 1, and stage 2. Your blood pressure reading will be used to determine what type of treatment, if any, is necessary. Appropriate treatment options are tied to these four stages:  Normal  Systolic pressure (mm Hg): below 120.  Diastolic pressure (mm Hg): below 80. Prehypertension  Systolic pressure (mm Hg): 120 to 139.  Diastolic pressure (mm Hg): 80 to 89. Stage1  Systolic pressure (mm Hg): 140 to 159.  Diastolic pressure (mm Hg): 90 to 99. Stage2  Systolic pressure (mm Hg): 160 or above.  Diastolic pressure (mm Hg): 100 or above. RISKS RELATED TO HIGH BLOOD PRESSURE Managing your blood pressure is an important responsibility. Uncontrolled high blood pressure can lead to:  A heart attack.  A stroke.  A weakened blood vessel (aneurysm).  Heart failure.  Kidney damage.  Eye damage.  Metabolic syndrome.  Memory and concentration problems. HOW TO MANAGE YOUR BLOOD PRESSURE Blood pressure  can be managed effectively with lifestyle changes and medicines (if needed). Your caregiver will help you come up with a plan to bring your blood pressure within a normal range. Your plan should include the following: Education  Read all information provided by your caregivers about how to control blood pressure.  Educate yourself on the latest guidelines and treatment recommendations. New research is always being done to further define the risks and treatments for high blood pressure. Lifestylechanges  Control your weight.  Avoid smoking.  Stay physically active.  Reduce the amount of salt in your diet.  Reduce stress.  Control any chronic conditions, such as high cholesterol or diabetes.  Reduce your alcohol intake. Medicines  Several medicines (antihypertensive medicines) are available, if needed, to bring blood pressure within a normal range. Communication  Review all the medicines  you take with your caregiver because there may be side effects or interactions.  Talk with your caregiver about your diet, exercise habits, and other lifestyle factors that may be contributing to high blood pressure.  See your caregiver regularly. Your caregiver can help you create and adjust your plan for managing high blood pressure. RECOMMENDATIONS FOR TREATMENT AND FOLLOW-UP  The following recommendations are based on current guidelines for managing high blood pressure in nonpregnant adults. Use these recommendations to identify the proper follow-up period or treatment option based on your blood pressure reading. You can discuss these options with your caregiver.  Systolic pressure of 120 to 139 or diastolic pressure of 80 to 89: Follow up with your caregiver as directed.  Systolic pressure of 140 to 160 or diastolic pressure of 90 to 100: Follow up with your caregiver within 2 months.  Systolic pressure above 160 or diastolic pressure above 100: Follow up with your caregiver within 1 month.  Systolic pressure above 180 or diastolic pressure above 110: Consider antihypertensive therapy; follow up with your caregiver within 1 week.  Systolic pressure above 200 or diastolic pressure above 120: Begin antihypertensive therapy; follow up with your caregiver within 1 week. Document Released: 08/19/2012 Document Reviewed: 08/19/2012 [facility] Patient Information 2015 [place], [place]. This information is not intended to replace advice given to you by your health care provider. Make sure you discuss any questions you have with your health care provider.

## 2015-12-26 ENCOUNTER — Encounter (HOSPITAL_COMMUNITY): Payer: Self-pay | Admitting: *Deleted

## 2015-12-26 ENCOUNTER — Emergency Department (INDEPENDENT_AMBULATORY_CARE_PROVIDER_SITE_OTHER)
Admission: EM | Admit: 2015-12-26 | Discharge: 2015-12-26 | Disposition: A | Discharge status: Home / Self Care | Payer: Self-pay | Source: Home / Self Care | Attending: Emergency Medicine | Admitting: Emergency Medicine

## 2015-12-26 DIAGNOSIS — N760 Acute vaginitis: Secondary | ICD-10-CM

## 2015-12-26 DIAGNOSIS — Z331 Pregnant state, incidental: Secondary | ICD-10-CM

## 2015-12-26 DIAGNOSIS — Z3490 Encounter for supervision of normal pregnancy, unspecified, unspecified trimester: Secondary | ICD-10-CM

## 2015-12-26 DIAGNOSIS — B9689 Other specified bacterial agents as the cause of diseases classified elsewhere: Secondary | ICD-10-CM

## 2015-12-26 DIAGNOSIS — Z3201 Encounter for pregnancy test, result positive: Secondary | ICD-10-CM

## 2015-12-26 DIAGNOSIS — A499 Bacterial infection, unspecified: Secondary | ICD-10-CM

## 2015-12-26 LAB — POCT PREGNANCY, URINE: Preg Test, Ur: POSITIVE — AB

## 2015-12-26 MED ORDER — CLINDAMYCIN HCL 300 MG PO CAPS: 300.0000 mg | ORAL_CAPSULE | Freq: Three times a day (TID) | ORAL | Status: DC

## 2015-12-26 NOTE — Discharge Instructions (Signed)
Congratulations on your pregnancy. Please start taking a prenatal vitamin. Follow-up with social services to get pregnancy Medicaid and start your prenatal care.  Take clindamycin 3 times a day for 1 week for BV. This medicine is safe in pregnancy. Follow-up as needed.

## 2015-12-26 NOTE — ED Provider Notes (Signed)
CSN: [id number]     Arrival date & time 12/26/15  1300 History   First MD Initiated Contact with Patient 12/26/15 1315     Chief Complaint  Patient presents with  . Vaginal Itching   (Consider location/radiation/quality/duration/timing/severity/associated sxs/prior Treatment) HPI  She is a 30 year old woman here for evaluation of vaginal odor. She states she noted the odor and she woke up this morning. She denies any itching or discharge at this time. No abdominal pain. No urinary symptoms. She states she gets BV every couple of months and this is the same symptoms. No new sexual partners. She has no concern for STDs. Her last menstrual period was in November.  She is not currently on birth control.  She states she is allergic to Flagyl. The last time she took it she developed blisters on her skin.  Past Medical History  Diagnosis Date  . Pregnancy induced hypertension    Past Surgical History  Procedure Laterality Date  . No past surgeries    . Dilation and evacuation N/A 02/21/2013    Procedure: DILATATION AND EVACUATION;  Surgeon: Reva Bores, MD;  Location: [facility];  Service: Gynecology;  Laterality: N/A;   Family History  Problem Relation Age of Onset  . Hypertension Mother    Social History  Substance Use Topics  . Smoking status: Current Every Day Smoker -- 0.25 packs/day    Types: Cigarettes  . Smokeless tobacco: Former Neurosurgeon    Quit date: 01/08/2015  . Alcohol Use: No   OB History    Gravida Para Term Preterm AB TAB SAB Ectopic Multiple Living   Review of Systems As in history of present illness Allergies  Flagyl  Home Medications   Prior to Admission medications   Medication Sig Start Date End Date Taking? Authorizing Provider  clindamycin (CLEOCIN) 300 MG capsule Take 1 capsule (300 mg total) by mouth 3 (three) times daily. 12/26/15   Charm Rings, MD  Prenat w/o A-FE-Methfol-FA-DHA (PRENATE RESTORE) 27-0.6-0.4-400 MG CAPS Take 1 tablet by  mouth daily. 03/01/15   Roe Coombs, CNM   Meds Ordered and Administered this Visit  Medications - No data to display  BP 154/95 mmHg  Pulse 59  Temp(Src) 97.8 F (36.6 C) (Oral)  Resp 12  SpO2 98%  LMP 10/21/2015 No data found.   Physical Exam  Constitutional: She is oriented to person, place, and time. She appears well-developed and well-nourished. No distress.  Cardiovascular: Normal rate.   Pulmonary/Chest: Effort normal.  Genitourinary:  Pelvic exam deferred  Neurological: She is alert and oriented to person, place, and time.    ED Course  Procedures (including critical care time)  Labs Review Labs Reviewed  POCT PREGNANCY, URINE - Abnormal; Notable for the following:    Preg Test, Ur POSITIVE (*)    All other components within normal limits    Imaging Review No results found.    MDM   1. BV (bacterial vaginosis)   2. Pregnancy    Patient reports this is the same as prior episodes of BV. We'll go ahead and treat with clindamycin as she is allergic to Flagyl. Patient's pregnancy test was positive. Pregnancy verification letter provided. Recommended she start a prenatal vitamin and establish prenatal care.    Charm Rings, MD 12/26/15 [phone]

## 2015-12-26 NOTE — ED Notes (Signed)
Pt  Reports      Late  On  Her period         As  Well  As  Having  Vaginal       Irritation     Pt  Ambulated  To room  With a  Steady  Fluid gait       Pt   Sitting  Upright  On the  Exam table  Speaking in  Complete  sentances

## 2015-12-29 ENCOUNTER — Inpatient Hospital Stay (HOSPITAL_COMMUNITY)
Admission: AD | Admit: 2015-12-29 | Discharge: 2015-12-29 | Disposition: A | Discharge status: Home / Self Care | Payer: Self-pay | Source: Ambulatory Visit | Attending: Obstetrics and Gynecology | Admitting: Obstetrics and Gynecology

## 2015-12-29 ENCOUNTER — Encounter (HOSPITAL_COMMUNITY): Payer: Self-pay | Admitting: *Deleted

## 2015-12-29 ENCOUNTER — Inpatient Hospital Stay (HOSPITAL_COMMUNITY): Payer: Self-pay

## 2015-12-29 DIAGNOSIS — Z87891 Personal history of nicotine dependence: Secondary | ICD-10-CM | POA: Insufficient documentation

## 2015-12-29 DIAGNOSIS — A499 Bacterial infection, unspecified: Secondary | ICD-10-CM

## 2015-12-29 DIAGNOSIS — O26851 Spotting complicating pregnancy, first trimester: Secondary | ICD-10-CM

## 2015-12-29 DIAGNOSIS — Z3A01 Less than 8 weeks gestation of pregnancy: Secondary | ICD-10-CM | POA: Insufficient documentation

## 2015-12-29 DIAGNOSIS — O3680X0 Pregnancy with inconclusive fetal viability, not applicable or unspecified: Secondary | ICD-10-CM

## 2015-12-29 DIAGNOSIS — R109 Unspecified abdominal pain: Secondary | ICD-10-CM | POA: Insufficient documentation

## 2015-12-29 DIAGNOSIS — O98911 Unspecified maternal infectious and parasitic disease complicating pregnancy, first trimester: Secondary | ICD-10-CM | POA: Insufficient documentation

## 2015-12-29 DIAGNOSIS — O23591 Infection of other part of genital tract in pregnancy, first trimester: Secondary | ICD-10-CM

## 2015-12-29 DIAGNOSIS — N760 Acute vaginitis: Secondary | ICD-10-CM | POA: Insufficient documentation

## 2015-12-29 DIAGNOSIS — B9689 Other specified bacterial agents as the cause of diseases classified elsewhere: Secondary | ICD-10-CM

## 2015-12-29 DIAGNOSIS — O26899 Other specified pregnancy related conditions, unspecified trimester: Secondary | ICD-10-CM

## 2015-12-29 LAB — CBC WITH DIFFERENTIAL/PLATELET
Basophils Absolute: 0.1 10*3/uL (ref 0.0–0.1)
Basophils Relative: 1 %
Eosinophils Absolute: 0.2 10*3/uL (ref 0.0–0.7)
Eosinophils Relative: 5 %
HCT: 38.4 % (ref 36.0–46.0)
Hemoglobin: 12.4 g/dL (ref 12.0–15.0)
Lymphocytes Relative: 41 %
Lymphs Abs: 1.9 10*3/uL (ref 0.7–4.0)
MCH: 26.9 pg (ref 26.0–34.0)
MCHC: 32.3 g/dL (ref 30.0–36.0)
MCV: 83.3 fL (ref 78.0–100.0)
Monocytes Absolute: 0.3 10*3/uL (ref 0.1–1.0)
Monocytes Relative: 6 %
Neutro Abs: 2.3 10*3/uL (ref 1.7–7.7)
Neutrophils Relative %: 47 %
Platelets: 220 10*3/uL (ref 150–400)
RBC: 4.61 MIL/uL (ref 3.87–5.11)
RDW: 14.9 % (ref 11.5–15.5)
WBC: 4.7 10*3/uL (ref 4.0–10.5)

## 2015-12-29 LAB — URINE MICROSCOPIC-ADD ON

## 2015-12-29 LAB — URINALYSIS, ROUTINE W REFLEX MICROSCOPIC
Bilirubin Urine: NEGATIVE
Glucose, UA: NEGATIVE mg/dL
Ketones, ur: NEGATIVE mg/dL
Leukocytes, UA: NEGATIVE
Nitrite: NEGATIVE
Protein, ur: NEGATIVE mg/dL
Specific Gravity, Urine: 1.01 (ref 1.005–1.030)
pH: 6 (ref 5.0–8.0)

## 2015-12-29 LAB — HCG, QUANTITATIVE, PREGNANCY: hCG, Beta Chain, Quant, S: 725 m[IU]/mL — ABNORMAL HIGH (ref <5)

## 2015-12-29 LAB — WET PREP, GENITAL
Sperm: NONE SEEN
Trich, Wet Prep: NONE SEEN
Yeast Wet Prep HPF POC: NONE SEEN

## 2015-12-29 NOTE — MAU Note (Signed)
Found out i was pregnant 2 days ago at urgent care. Spotting. Occ cramping.  LMP 12/05/15

## 2015-12-29 NOTE — Discharge Instructions (Signed)
Bacterial Vaginosis °Bacterial vaginosis is a vaginal infection that occurs when the normal balance of bacteria in the vagina is disrupted. It results from an overgrowth of certain bacteria. This is the most common vaginal infection in women of childbearing age. Treatment is important to prevent complications, especially in pregnant women, as it can cause a premature delivery. °CAUSES  °Bacterial vaginosis is caused by an increase in harmful bacteria that are normally present in smaller amounts in the vagina. Several different kinds of bacteria can cause bacterial vaginosis. However, the reason that the condition develops is not fully understood. °RISK FACTORS °Certain activities or behaviors can put you at an increased risk of developing bacterial vaginosis, including: °· Having a new sex partner or multiple sex partners. °· Douching. °· Using an intrauterine device (IUD) for contraception. °Women do not get bacterial vaginosis from toilet seats, bedding, swimming pools, or contact with objects around them. °SIGNS AND SYMPTOMS  °Some women with bacterial vaginosis have no signs or symptoms. Common symptoms include: °· Grey vaginal discharge. °· A fishlike odor with discharge, especially after sexual intercourse. °· Itching or burning of the vagina and vulva. °· Burning or pain with urination. °DIAGNOSIS  °Your health care provider will take a medical history and examine the vagina for signs of bacterial vaginosis. A sample of vaginal fluid may be taken. Your health care provider will look at this sample under a microscope to check for bacteria and abnormal cells. A vaginal pH test may also be done.  °TREATMENT  °Bacterial vaginosis may be treated with antibiotic medicines. These may be given in the form of a pill or a vaginal cream. A second round of antibiotics may be prescribed if the condition comes back after treatment. Because bacterial vaginosis increases your risk for sexually transmitted diseases, getting  treated can help reduce your risk for chlamydia, gonorrhea, HIV, and herpes. °HOME CARE INSTRUCTIONS  °· Only take over-the-counter or prescription medicines as directed by your health care provider. °· If antibiotic medicine was prescribed, take it as directed. Make sure you finish it even if you start to feel better. °· Tell all sexual partners that you have a vaginal infection. They should see their health care provider and be treated if they have problems, such as a mild rash or itching. °· During treatment, it is important that you follow these instructions: °· Avoid sexual activity or use condoms correctly. °· Do not douche. °· Avoid alcohol as directed by your health care provider. °· Avoid breastfeeding as directed by your health care provider. °SEEK MEDICAL CARE IF:  °· Your symptoms are not improving after 3 days of treatment. °· You have increased discharge or pain. °· You have a fever. °MAKE SURE YOU:  °· Understand these instructions. °· Will watch your condition. °· Will get help right away if you are not doing well or get worse. °FOR MORE INFORMATION  °Centers for Disease Control and Prevention, Division of STD Prevention: www.cdc.gov/std °American Sexual Health Association (ASHA): www.ashastd.org  °  °This information is not intended to replace advice given to you by your health care provider. Make sure you discuss any questions you have with your health care provider. °  °Document Released: 11/25/2005 Document Revised: 12/16/2014 Document Reviewed: 07/07/2013 °Elsevier Interactive Patient Education ©2016 Elsevier Inc. °Abdominal Pain During Pregnancy °Abdominal pain is common in pregnancy. Most of the time, it does not cause harm. There are many causes of abdominal pain. Some causes are more serious than others. Some of the causes   of abdominal pain in pregnancy are easily diagnosed. Occasionally, the diagnosis takes time to understand. Other times, the cause is not determined. Abdominal pain can be  a sign that something is very wrong with the pregnancy, or the pain may have nothing to do with the pregnancy at all. For this reason, always tell your health care provider if you have any abdominal discomfort. °HOME CARE INSTRUCTIONS  °Monitor your abdominal pain for any changes. The following actions may help to alleviate any discomfort you are experiencing: °· Do not have sexual intercourse or put anything in your vagina until your symptoms go away completely. °· Get plenty of rest until your pain improves. °· Drink clear fluids if you feel nauseous. Avoid solid food as long as you are uncomfortable or nauseous. °· Only take over-the-counter or prescription medicine as directed by your health care provider. °· Keep all follow-up appointments with your health care provider. °SEEK IMMEDIATE MEDICAL CARE IF: °· You are bleeding, leaking fluid, or passing tissue from the vagina. °· You have increasing pain or cramping. °· You have persistent vomiting. °· You have painful or bloody urination. °· You have a fever. °· You notice a decrease in your baby's movements. °· You have extreme weakness or feel faint. °· You have shortness of breath, with or without abdominal pain. °· You develop a severe headache with abdominal pain. °· You have abnormal vaginal discharge with abdominal pain. °· You have persistent diarrhea. °· You have abdominal pain that continues even after rest, or gets worse. °MAKE SURE YOU:  °· Understand these instructions. °· Will watch your condition. °· Will get help right away if you are not doing well or get worse. °  °This information is not intended to replace advice given to you by your health care provider. Make sure you discuss any questions you have with your health care provider. °  °Document Released: 11/25/2005 Document Revised: 09/15/2013 Document Reviewed: 06/24/2013 °Elsevier Interactive Patient Education ©2016 Elsevier Inc. ° °

## 2015-12-29 NOTE — MAU Provider Note (Signed)
History     CSN: [id number]  Arrival date and time: 12/29/15 4098   First Provider Initiated Contact with Patient 12/29/15 2002      Chief Complaint  Patient presents with  . Vaginal Bleeding   HPI  [name] is a 30 y.o. [phone] at [redacted]w[redacted]d who presents to MAU today with complaint of spotting and intermittent cramping. She states +UPT at urgent care earlier this week. She denies use of pain medication. She also denies vaginal discharge, UTI symptoms, fever, vomiting, diarrhea or constipation. She reports mild, regular nausea.   OB History    Gravida Para Term Preterm AB TAB SAB Ectopic Multiple Living   Past Medical History  Diagnosis Date  . Pregnancy induced hypertension     Past Surgical History  Procedure Laterality Date  . No past surgeries    . Dilation and evacuation N/A 02/21/2013    Procedure: DILATATION AND EVACUATION;  Surgeon: Reva Bores, MD;  Location: [facility];  Service: Gynecology;  Laterality: N/A;    Family History  Problem Relation Age of Onset  . Hypertension Mother     Social History  Substance Use Topics  . Smoking status: Former Smoker -- 0.25 packs/day    Types: Cigarettes  . Smokeless tobacco: Former Neurosurgeon    Quit date: 01/08/2015  . Alcohol Use: No    Allergies:  Allergies  Allergen Reactions  . Flagyl [Metronidazole] Rash    Prescriptions prior to admission  Medication Sig Dispense Refill Last Dose  . clindamycin (CLEOCIN) 300 MG capsule Take 1 capsule (300 mg total) by mouth 3 (three) times daily. 21 capsule 0 12/29/2015 at Unknown time    Review of Systems  Constitutional: Negative for fever and malaise/fatigue.  Gastrointestinal: Positive for nausea and abdominal pain. Negative for vomiting, diarrhea and constipation.  Genitourinary: Negative for dysuria, urgency and frequency.       + spotting   Physical Exam   Blood pressure 137/97, pulse 73, temperature 98.2 F (36.8 C), temperature source  Oral, resp. rate 16, height  (1.702 m), weight 126 lb 9.6 oz (57.425 kg), last menstrual period 12/05/2015.  Physical Exam  Nursing note and vitals reviewed. Constitutional: She is oriented to person, place, and time. She appears well-developed and well-nourished. No distress.  HENT:  Head: Normocephalic and atraumatic.  Cardiovascular: Normal rate.   Respiratory: Effort normal.  GI: Soft. She exhibits no distension and no mass. There is no tenderness. There is no rebound and no guarding.  Genitourinary: Uterus is enlarged (slightly). Uterus is not tender. Cervix exhibits no motion tenderness, no discharge and no friability. Right adnexum displays no mass and no tenderness. Left adnexum displays no mass and no tenderness. There is bleeding (scant) in the vagina. No vaginal discharge found.  Neurological: She is alert and oriented to person, place, and time.  Skin: Skin is warm and dry. No erythema.  Psychiatric: She has a normal mood and affect.    Results for orders placed or performed during the hospital encounter of 12/29/15 (from the past 24 hour(s))  Urinalysis, Routine w reflex microscopic (not at [facility])     Status: Abnormal   Collection Time: 12/29/15  7:35 PM  Result Value Ref Range   Color, Urine YELLOW YELLOW   APPearance CLEAR CLEAR   Specific Gravity, Urine 1.010 1.005 - 1.030   pH 6.0 5.0 - 8.0   Glucose,  UA NEGATIVE NEGATIVE mg/dL   Hgb urine dipstick MODERATE (A) NEGATIVE   Bilirubin Urine NEGATIVE NEGATIVE   Ketones, ur NEGATIVE NEGATIVE mg/dL   Protein, ur NEGATIVE NEGATIVE mg/dL   Nitrite NEGATIVE NEGATIVE   Leukocytes, UA NEGATIVE NEGATIVE  Urine microscopic-add on     Status: Abnormal   Collection Time: 12/29/15  7:35 PM  Result Value Ref Range   Squamous Epithelial / LPF 0-5 (A) NONE SEEN   WBC, UA 0-5 0 - 5 WBC/hpf   RBC / HPF 0-5 0 - 5 RBC/hpf   Bacteria, UA FEW (A) NONE SEEN  CBC with Differential/Platelet     Status: None   Collection Time: 12/29/15   7:43 PM  Result Value Ref Range   WBC 4.7 4.0 - 10.5 K/uL   RBC 4.61 3.87 - 5.11 MIL/uL   Hemoglobin 12.4 12.0 - 15.0 g/dL   HCT 16.1 09.6 - 04.5 %   MCV 83.3 78.0 - 100.0 fL   MCH 26.9 26.0 - 34.0 pg   MCHC 32.3 30.0 - 36.0 g/dL   RDW 40.9 81.1 - 91.4 %   Platelets 220 150 - 400 K/uL   Neutrophils Relative % 47 %   Neutro Abs 2.3 1.7 - 7.7 K/uL   Lymphocytes Relative 41 %   Lymphs Abs 1.9 0.7 - 4.0 K/uL   Monocytes Relative 6 %   Monocytes Absolute 0.3 0.1 - 1.0 K/uL   Eosinophils Relative 5 %   Eosinophils Absolute 0.2 0.0 - 0.7 K/uL   Basophils Relative 1 %   Basophils Absolute 0.1 0.0 - 0.1 K/uL  hCG, quantitative, pregnancy     Status: Abnormal   Collection Time: 12/29/15  7:43 PM  Result Value Ref Range   hCG, Beta Chain, Quant, S 725 (H) <5 mIU/mL  Wet prep, genital     Status: Abnormal   Collection Time: 12/29/15  8:04 PM  Result Value Ref Range   Yeast Wet Prep HPF POC NONE SEEN NONE SEEN   Trich, Wet Prep NONE SEEN NONE SEEN   Clue Cells Wet Prep HPF POC PRESENT (A) NONE SEEN   WBC, Wet Prep HPF POC MODERATE (A) NONE SEEN   Sperm NONE SEEN    US Ob Comp Less 14 Wks  12/29/2015  CLINICAL DATA:  First trimester pregnancy with spotting and abdominal pain EXAM: OBSTETRIC <14 WK [place] AND TRANSVAGINAL OB US TECHNIQUE: Both transabdominal and transvaginal ultrasound examinations were performed for complete evaluation of the gestation as well as the maternal uterus, adnexal regions, and pelvic cul-de-sac. Transvaginal technique was performed to assess early pregnancy. COMPARISON:  None. FINDINGS: Intrauterine gestational sac: None Yolk sac:  None Embryo:  None Cardiac Activity: None Subchorionic hemorrhage:  None visualized. Maternal uterus/adnexae: Ovaries are normal. Trace free fluid in the cul-de-sac. Endometrium appears heterogeneous and measures 15 mm in diameter. IMPRESSION: No evidence of intrauterine gestation. Differential diagnostic possibilities include pregnancy that  is too early to image as well as missed spontaneous abortion. There is trace free fluid but no specific evidence of ectopic pregnancy although that is also by definition a differential diagnostic consideration. Electronically Signed   By: Esperanza Heir M.D.   On: 12/29/2015 21:23   US Ob Transvaginal  12/29/2015  CLINICAL DATA:  First trimester pregnancy with spotting and abdominal pain EXAM: OBSTETRIC <14 WK [place] AND TRANSVAGINAL OB US TECHNIQUE: Both transabdominal and transvaginal ultrasound examinations were performed for complete evaluation of the gestation as well as the maternal uterus, adnexal regions,  and pelvic cul-de-sac. Transvaginal technique was performed to assess early pregnancy. COMPARISON:  None. FINDINGS: Intrauterine gestational sac: None Yolk sac:  None Embryo:  None Cardiac Activity: None Subchorionic hemorrhage:  None visualized. Maternal uterus/adnexae: Ovaries are normal. Trace free fluid in the cul-de-sac. Endometrium appears heterogeneous and measures 15 mm in diameter. IMPRESSION: No evidence of intrauterine gestation. Differential diagnostic possibilities include pregnancy that is too early to image as well as missed spontaneous abortion. There is trace free fluid but no specific evidence of ectopic pregnancy although that is also by definition a differential diagnostic consideration. Electronically Signed   By: Esperanza Heir M.D.   On: 12/29/2015 21:23    MAU Course  Procedures None  MDM +UPT at Urgent Care this week UA, wet prep, GC/chlamydia, CBC, quant hCG, HIV, RPR and [place] today to rule out ectopic pregnancy 2050 - patient waiting for [place]. Care turned over to [facility], NP  Marny Lowenstein, PA-C  12/29/2015, 8:50 PM   Assessment and Plan    A:  1. BV (bacterial vaginosis)   2. Spotting affecting pregnancy in first trimester, antepartum   3. Abdominal pain affecting pregnancy, antepartum   4. Pregnancy of unknown anatomic location     P:  Discharge  home in stable condition  Follow up in the [facility] in 48 hours (3:00 pm Monday for follow up quant) BP mildly elevated here today in MAU; patient has a history of preeclampsia with last pregnancy. BP check on Monday at the [facility]  Return to MAU if symptoms worsen Ectopic precautions  Pelvic rest  Continue clindamycin   Duane Lope, NP 12/29/2015 10:20 PM

## 2015-12-30 ENCOUNTER — Telehealth: Payer: Self-pay | Admitting: Obstetrics and Gynecology

## 2015-12-30 LAB — RPR: RPR Ser Ql: NONREACTIVE

## 2015-12-30 LAB — HIV ANTIBODY (ROUTINE TESTING W REFLEX): HIV Screen 4th Generation wRfx: NONREACTIVE

## 2015-12-30 NOTE — Telephone Encounter (Signed)
Patient not able to go to the [facility] for follow up quant due to Son's Dr. visit. Patient was agreeable to come to MAU Sunday night as this is the only time that would work for her.

## 2016-01-01 LAB — GC/CHLAMYDIA PROBE AMP (~~LOC~~) NOT AT ARMC
Chlamydia: NEGATIVE
Neisseria Gonorrhea: NEGATIVE

## 2016-01-05 ENCOUNTER — Encounter: Payer: Self-pay | Admitting: Medical

## 2016-01-05 ENCOUNTER — Ambulatory Visit (INDEPENDENT_AMBULATORY_CARE_PROVIDER_SITE_OTHER): Payer: Self-pay | Admitting: Obstetrics and Gynecology

## 2016-01-05 ENCOUNTER — Encounter: Payer: Self-pay | Admitting: Obstetrics and Gynecology

## 2016-01-05 DIAGNOSIS — O3680X0 Pregnancy with inconclusive fetal viability, not applicable or unspecified: Secondary | ICD-10-CM

## 2016-01-05 DIAGNOSIS — O039 Complete or unspecified spontaneous abortion without complication: Secondary | ICD-10-CM

## 2016-01-05 LAB — HCG, QUANTITATIVE, PREGNANCY: hCG, Beta Chain, Quant, S: 495 m[IU]/mL — ABNORMAL HIGH (ref <5)

## 2016-01-05 NOTE — Patient Instructions (Signed)
Incomplete Miscarriage A miscarriage is the sudden loss of an unborn baby (fetus) before the 20th week of pregnancy. In an incomplete miscarriage, parts of the fetus or placenta (afterbirth) remain in the body.  Having a miscarriage can be an emotional experience. Talk with your health care provider about any questions you may have about miscarrying, the grieving process, and your future pregnancy plans. CAUSES   Problems with the fetal chromosomes that make it impossible for the baby to develop normally. Problems with the baby's genes or chromosomes are most often the result of errors that occur by chance as the embryo divides and grows. The problems are not inherited from the parents.  Infection of the cervix or uterus.  Hormone problems.  Problems with the cervix, such as having an incompetent cervix. This is when the tissue in the cervix is not strong enough to hold the pregnancy.  Problems with the uterus, such as an abnormally shaped uterus, uterine fibroids, or congenital abnormalities.  Certain medical conditions.  Smoking, drinking alcohol, or taking illegal drugs.  Trauma. SYMPTOMS   Vaginal bleeding or spotting, with or without cramps or pain.  Pain or cramping in the abdomen or lower back.  Passing fluid, tissue, or blood clots from the vagina. DIAGNOSIS  Your health care provider will perform a physical exam. You may also have an ultrasound to confirm the miscarriage. Blood or urine tests may also be ordered. TREATMENT   Usually, a dilation and curettage (D&C) procedure is performed. During a D&C procedure, the cervix is widened (dilated) and any remaining fetal or placental tissue is gently removed from the uterus.  Antibiotic medicines are prescribed if there is an infection. Other medicines may be given to reduce the size of the uterus (contract) if there is a lot of bleeding.  If you have Rh negative blood and your baby was Rh positive, you will need a Rho (D)  immune globulin shot. This shot will protect any future baby from having Rh blood problems in future pregnancies.  You may be confined to bed rest. This means you should stay in bed and only get up to use the bathroom. HOME CARE INSTRUCTIONS   Rest as directed by your health care provider.  Restrict activity as directed by your health care provider. You may be allowed to continue light activity if curettage was not done but you require further treatment.  Keep track of the number of pads you use each day. Keep track of how soaked (saturated) they are. Record this information.  Do not  use tampons.  Do not douche or have sexual intercourse until approved by your health care provider.  Keep all follow-up appointments for reevaluation and continuing management.  Only take over-the-counter or prescription medicines for pain, fever, or discomfort as directed by your health care provider.  Take antibiotic medicine as directed by your health care provider. Make sure you finish it even if you start to feel better. SEEK IMMEDIATE MEDICAL CARE IF:   You experience severe cramps in your stomach, back, or abdomen.  You have an unexplained temperature (make sure to record these temperatures).  You pass large clots or tissue (save these for your health care provider to inspect).  Your bleeding increases.  You become light-headed, weak, or have fainting episodes. MAKE SURE YOU:   Understand these instructions.  Will watch your condition.  Will get help right away if you are not doing well or get worse.   This information is not intended to   replace advice given to you by your health care provider. Make sure you discuss any questions you have with your health care provider.   Document Released: 11/25/2005 Document Revised: 12/16/2014 Document Reviewed: 06/24/2013 Elsevier Interactive Patient Education 2016 Elsevier Inc.  

## 2016-01-05 NOTE — Progress Notes (Signed)
Patient ID: Angela Manning, female   DOB: 03/26/1986, 30 y.o.   MRN: [id number]  [name]  is a 30 y.o. [phone] at [redacted]w[redacted]d who presents to the [facility] today for follow-up quant hCG. The patient was supoposed to return after 48 hours, but did not return utnil 7 days. The patient was seen in MAU on 12/29/15 and had quant hCG of 725 and US showed nothing in the uterus or adnexa. She denis pain, vaginal bleeding or fever today.   OB History  Gravida Para Term Preterm AB SAB TAB Ectopic Multiple Living  # Outcome Date GA Lbr Len/2nd Weight Sex Delivery Anes PTL Lv  5 Current           4 Term           3 Term           2 Term           1 Preterm               Past Medical History  Diagnosis Date  . Pregnancy induced hypertension      LMP 12/05/2015  CONSTITUTIONAL: Well-developed, well-nourished female in no acute distress.  ENT: External right and left ear normal.  EYES: EOM intact, conjunctivae normal.  MUSCULOSKELETAL: Normal range of motion.  CARDIOVASCULAR: Regular heart rate RESPIRATORY: Normal effort NEUROLOGICAL: Alert and oriented to person, place, and time.  SKIN: Skin is warm and dry. No rash noted. Not diaphoretic. No erythema. No pallor. PSYCH: Normal mood and affect. Normal behavior. Normal judgment and thought content.  Results for orders placed or performed in visit on 01/05/16 (from the past 24 hour(s))  hCG, quantitative, pregnancy     Status: Abnormal   Collection Time: 01/05/16 10:50 AM  Result Value Ref Range   hCG, Beta Chain, Quant, S 495 (H) <5 mIU/mL    A: SAB in progress  P: Discharge home Bleeding/Ectopic precautions discussed Patient will be scheduled in [facility] for repeat labs in 1 week and follow-up in 2 weeks. They will call patient with an appointment time.  Patient may return to MAU as needed or if her condition were to change or worsen   Marny Lowenstein, PA-C 01/05/2016 12:09 PM

## 2016-01-12 ENCOUNTER — Other Ambulatory Visit: Payer: Self-pay

## 2016-01-24 ENCOUNTER — Encounter: Payer: Self-pay | Admitting: Obstetrics and Gynecology

## 2016-03-01 ENCOUNTER — Encounter (HOSPITAL_COMMUNITY): Payer: Self-pay | Admitting: Emergency Medicine

## 2016-03-01 ENCOUNTER — Emergency Department (HOSPITAL_COMMUNITY)
Admission: EM | Admit: 2016-03-01 | Discharge: 2016-03-01 | Disposition: A | Discharge status: Home / Self Care | Payer: Self-pay | Source: Home / Self Care | Attending: Emergency Medicine | Admitting: Emergency Medicine

## 2016-03-01 ENCOUNTER — Other Ambulatory Visit (HOSPITAL_COMMUNITY)
Admission: RE | Admit: 2016-03-01 | Discharge: 2016-03-01 | Disposition: A | Discharge status: Home / Self Care | Payer: Self-pay | Source: Ambulatory Visit | Attending: Emergency Medicine | Admitting: Emergency Medicine

## 2016-03-01 DIAGNOSIS — Z113 Encounter for screening for infections with a predominantly sexual mode of transmission: Secondary | ICD-10-CM | POA: Insufficient documentation

## 2016-03-01 DIAGNOSIS — N760 Acute vaginitis: Secondary | ICD-10-CM | POA: Insufficient documentation

## 2016-03-01 LAB — POCT URINALYSIS DIP (DEVICE)
Bilirubin Urine: NEGATIVE
Glucose, UA: NEGATIVE mg/dL
Hgb urine dipstick: NEGATIVE
Ketones, ur: NEGATIVE mg/dL
Leukocytes, UA: NEGATIVE
Nitrite: NEGATIVE
Protein, ur: NEGATIVE mg/dL
Specific Gravity, Urine: 1.02 (ref 1.005–1.030)
Urobilinogen, UA: 0.2 mg/dL (ref 0.0–1.0)
pH: 6.5 (ref 5.0–8.0)

## 2016-03-01 LAB — HCG, QUANTITATIVE, PREGNANCY: hCG, Beta Chain, Quant, S: 32 m[IU]/mL — ABNORMAL HIGH (ref <5)

## 2016-03-01 LAB — POCT PREGNANCY, URINE: Preg Test, Ur: NEGATIVE

## 2016-03-01 MED ORDER — CLINDAMYCIN HCL 300 MG PO CAPS: 300.0000 mg | ORAL_CAPSULE | Freq: Three times a day (TID) | ORAL | Status: DC

## 2016-03-01 NOTE — Discharge Instructions (Signed)
We will call you with your results in 2-3 days. Take the clindamycin as prescribed. Follow-up as needed.

## 2016-03-01 NOTE — ED Notes (Signed)
Here with pelvic cramps bilaterally, no discharge, foul odor Started 2 days ago Low back pain, chills and vaginitis reported LMP- 2/20, denies bleeding

## 2016-03-01 NOTE — ED Provider Notes (Signed)
CSN: [id number]     Arrival date & time 03/01/16  1546 History   First MD Initiated Contact with Patient 03/01/16 1717     Chief Complaint  Patient presents with  . Pelvic Pain  . Vaginitis   (Consider location/radiation/quality/duration/timing/severity/associated sxs/prior Treatment) HPI She is a 30 year old woman here for evaluation of pelvic pain and vaginal odor. She states for the last several days she has had crampy pelvic pain. Today, she has noticed an odor. She denies any discharge. No new sexual partners. She did have a miscarriage in January. She reports a normal period February 20.  Past Medical History  Diagnosis Date  . Pregnancy induced hypertension    Past Surgical History  Procedure Laterality Date  . No past surgeries    . Dilation and evacuation N/A 02/21/2013    Procedure: DILATATION AND EVACUATION;  Surgeon: Reva Boresanya S Pratt, MD;  Location: [facility];  Service: Gynecology;  Laterality: N/A;   Family History  Problem Relation Age of Onset  . Hypertension Mother    Social History  Substance Use Topics  . Smoking status: Former Smoker -- 0.25 packs/day    Types: Cigarettes  . Smokeless tobacco: Former NeurosurgeonUser    Quit date: 01/08/2015  . Alcohol Use: No   OB History    Gravida Para Term Preterm AB TAB SAB Ectopic Multiple Living   5 4 3 1      4      Review of Systems As in history of present illness Allergies  Flagyl  Home Medications   Prior to Admission medications   Medication Sig Start Date End Date Taking? Authorizing Provider  clindamycin (CLEOCIN) 300 MG capsule Take 1 capsule (300 mg total) by mouth 3 (three) times daily. 03/01/16   Charm RingsErin J Honig, MD   Meds Ordered and Administered this Visit  Medications - No data to display  BP 156/98 mmHg  Pulse 73  Temp(Src) 97.9 F (36.6 C) (Oral)  SpO2 94%  LMP 01/31/2015  Breastfeeding? Unknown No data found.   Physical Exam  Constitutional: She is oriented to person, place, and time. She appears  well-developed and well-nourished. No distress.  Cardiovascular: Normal rate.   Pulmonary/Chest: Effort normal.  Genitourinary: There is no rash on the right labia. There is no rash on the left labia. Cervix exhibits no motion tenderness. No bleeding in the vagina. No foreign body around the vagina. No vaginal discharge found.  Neurological: She is alert and oriented to person, place, and time.    ED Course  Procedures (including critical care time)  Labs Review Labs Reviewed  HCG, QUANTITATIVE, PREGNANCY  POCT URINALYSIS DIP (DEVICE)  POCT PREGNANCY, URINE  CERVICOVAGINAL ANCILLARY ONLY    Imaging Review No results found.   MDM   1. Vaginitis    Patient reports this is the same odor as with prior episodes of BV. We'll start her on presumptive treatment with clindamycin as she is allergic to Flagyl. Swabs collected. I did redraw quantitative hCG to make sure this is back to normal after her miscarriage.    Charm RingsErin J Honig, MD 03/01/16 [phone]

## 2016-03-04 LAB — CERVICOVAGINAL ANCILLARY ONLY
Chlamydia: NEGATIVE
Neisseria Gonorrhea: NEGATIVE

## 2016-03-05 ENCOUNTER — Inpatient Hospital Stay (HOSPITAL_COMMUNITY)
Admission: AD | Admit: 2016-03-05 | Discharge: 2016-03-05 | Disposition: A | Discharge status: Home / Self Care | Payer: Self-pay | Source: Ambulatory Visit | Attending: Family Medicine | Admitting: Family Medicine

## 2016-03-05 ENCOUNTER — Encounter (HOSPITAL_COMMUNITY): Payer: Self-pay | Admitting: *Deleted

## 2016-03-05 ENCOUNTER — Telehealth (HOSPITAL_COMMUNITY): Payer: Self-pay | Admitting: Emergency Medicine

## 2016-03-05 DIAGNOSIS — Z3A00 Weeks of gestation of pregnancy not specified: Secondary | ICD-10-CM | POA: Insufficient documentation

## 2016-03-05 DIAGNOSIS — E349 Endocrine disorder, unspecified: Secondary | ICD-10-CM

## 2016-03-05 DIAGNOSIS — O26891 Other specified pregnancy related conditions, first trimester: Secondary | ICD-10-CM | POA: Insufficient documentation

## 2016-03-05 LAB — CBC WITH DIFFERENTIAL/PLATELET
Basophils Absolute: 0 10*3/uL (ref 0.0–0.1)
Basophils Relative: 1 %
Eosinophils Absolute: 0.1 10*3/uL (ref 0.0–0.7)
Eosinophils Relative: 2 %
HCT: 36.4 % (ref 36.0–46.0)
Hemoglobin: 12 g/dL (ref 12.0–15.0)
Lymphocytes Relative: 38 %
Lymphs Abs: 1.9 10*3/uL (ref 0.7–4.0)
MCH: 27.1 pg (ref 26.0–34.0)
MCHC: 33 g/dL (ref 30.0–36.0)
MCV: 82.2 fL (ref 78.0–100.0)
Monocytes Absolute: 0.2 10*3/uL (ref 0.1–1.0)
Monocytes Relative: 4 %
Neutro Abs: 2.9 10*3/uL (ref 1.7–7.7)
Neutrophils Relative %: 55 %
Platelets: 239 10*3/uL (ref 150–400)
RBC: 4.43 MIL/uL (ref 3.87–5.11)
RDW: 14 % (ref 11.5–15.5)
WBC: 5.2 10*3/uL (ref 4.0–10.5)

## 2016-03-05 LAB — CERVICOVAGINAL ANCILLARY ONLY: Wet Prep (BD Affirm): NEGATIVE

## 2016-03-05 LAB — HCG, QUANTITATIVE, PREGNANCY: hCG, Beta Chain, Quant, S: 25 m[IU]/mL — ABNORMAL HIGH (ref <5)

## 2016-03-05 NOTE — MAU Note (Signed)
Had miscarriage at the end of Jan 2017; seen in urgent care and had negative UPT but had 32 on her serum quant; called by urgent care today and told to come to MAU; denies any pain or problems;

## 2016-03-05 NOTE — MAU Provider Note (Signed)
Chief Complaint: Possible Pregnancy   First Provider Initiated Contact with Patient 03/05/16 1631     SUBJECTIVE HPI: Angela Manning is a 30 y.o. [phone] female who instructed to come to Maternity Admissions for further evaluation of elevated hCG. Was seen at [facility] 03/01/16 for pelvic pain. Dx'd w/ BV. Provider noted that pt was Dx'd w/ SAB in January, but IUP had not been confirmed and pt did not follow up after one drop in hCG. Denies pelvic pain VB.   Pt states she has had normal 5 -day periods in February and March, has resumed IC w/out birth control.   Associated signs and symptoms: Neg for pelvic or abd pain, VB, fever, chills.   Past Medical History  Diagnosis Date  . Pregnancy induced hypertension    OB History  Gravida Para Term Preterm AB SAB TAB Ectopic Multiple Living  5 4 3 1      4     # Outcome Date GA Lbr Len/2nd Weight Sex Delivery Anes PTL Lv  5 Gravida           4 Term           3 Term           2 Term           1 Preterm              Past Surgical History  Procedure Laterality Date  . Dilation and evacuation N/A 02/21/2013    Procedure: DILATATION AND EVACUATION;  Surgeon: Reva Boresanya S Pratt, MD;  Location: [facility];  Service: Gynecology;  Laterality: N/A;   Social History   Social History  . Marital Status: Single    Spouse Name: N/A  . Number of Children: N/A  . Years of Education: N/A   Occupational History  . Not on file.   Social History Main Topics  . Smoking status: Former Smoker -- 0.25 packs/day    Types: Cigarettes  . Smokeless tobacco: Former NeurosurgeonUser    Quit date: 01/08/2015  . Alcohol Use: No  . Drug Use: No  . Sexual Activity:    Partners: Male    Birth Control/ Protection: None   Other Topics Concern  . Not on file   Social History Narrative   No current facility-administered medications on file prior to encounter.   Current Outpatient Prescriptions on File Prior to Encounter  Medication Sig Dispense Refill  .  clindamycin (CLEOCIN) 300 MG capsule Take 1 capsule (300 mg total) by mouth 3 (three) times daily. 21 capsule 0   Allergies  Allergen Reactions  . Flagyl [Metronidazole] Rash    I have reviewed the past Medical Hx, Surgical Hx, Social Hx, Allergies and Medications.   Review of Systems  Constitutional: Negative for fever and chills.  Gastrointestinal: Negative for abdominal pain.  Genitourinary: Negative for vaginal bleeding, vaginal discharge and menstrual problem.    OBJECTIVE Patient Vitals for the past 24 hrs:  BP Temp Temp src Pulse Resp  03/05/16 1858 143/93 mmHg 98.9 F (37.2 C) Oral 65 16  03/05/16 1616 146/92 mmHg 98.9 F (37.2 C) Oral 72 18   Constitutional: Well-developed, well-nourished female in no acute distress.  Cardiovascular: normal rate Respiratory: normal rate and effort.  GI: Abd soft, non-tender. Neurologic: Alert and oriented x 4.  AV:[id number]:Deferred  LAB RESULTS Results for orders placed or performed during the hospital encounter of 03/05/16 (from the past 24 hour(s))  CBC with Differential/Platelet  Status: None   Collection Time: 03/05/16  4:47 PM  Result Value Ref Range   WBC 5.2 4.0 - 10.5 K/uL   RBC 4.43 3.87 - 5.11 MIL/uL   Hemoglobin 12.0 12.0 - 15.0 g/dL   HCT 81.1 91.4 - 78.2 %   MCV 82.2 78.0 - 100.0 fL   MCH 27.1 26.0 - 34.0 pg   MCHC 33.0 30.0 - 36.0 g/dL   RDW 95.6 21.3 - 08.6 %   Platelets 239 150 - 400 K/uL   Neutrophils Relative % 55 %   Neutro Abs 2.9 1.7 - 7.7 K/uL   Lymphocytes Relative 38 %   Lymphs Abs 1.9 0.7 - 4.0 K/uL   Monocytes Relative 4 %   Monocytes Absolute 0.2 0.1 - 1.0 K/uL   Eosinophils Relative 2 %   Eosinophils Absolute 0.1 0.0 - 0.7 K/uL   Basophils Relative 1 %   Basophils Absolute 0.0 0.0 - 0.1 K/uL  hCG, quantitative, pregnancy     Status: Abnormal   Collection Time: 03/05/16  4:47 PM  Result Value Ref Range   hCG, Beta Chain, Quant, S 25 (H) <5 mIU/mL    IMAGING No results found.  MAU  COURSE HCG.   Discussed Hx, exam, labs w/ Dr. Shawnie Pons. [place] not indicated due to no pain or bleeding. Repeat quant in 1 week.   MDM - 30 year-old female w/ elevated hCG. Uncertain whether this is still elevated from failed pregnancy in January 2017 vs new failing pregnancy. Pt hemodynamically stable.   ASSESSMENT 1. Elevated serum hCG     PLAN Discharge home in stable condition. SAB and ectopic precautions     Follow-up Information    Follow up with [facility] On 03/12/2016.   Specialty:  Obstetrics and Gynecology   Why:  at 11:00 am for repeat labwork. Plan to stay for 2 hours.     Contact information:   [address] [place] [address] [phone]      Follow up with [facility] [place] [facility].   Why:  As needed in emergencies   Contact information:   [address] [id number] mc [place] [address] [phone]       Medication List    TAKE these medications        clindamycin 300 MG capsule  Commonly known as:  CLEOCIN  Take 1 capsule (300 mg total) by mouth 3 (three) times daily.         [place], [place] 03/05/2016  7:02 PM

## 2016-03-05 NOTE — ED Notes (Signed)
Pt called back... Lab results given Reports feeling better  Per Dr. Dayton ScrapeMurray,  Clinical staff, please let patient know that serum hcg should have returned to undetectable level (<1) by now, after pregnancy loss in late 12/2015.  Patient needs followup with Ob/GYN asap to assess for possible undiagnosed ectopic pregnancy, retained products of conception, or gestational trophoblastic disease ("molar pregnancy").  [facility] the past. LM  Adv pt to got [facility] for further eval or if sx get worse.  Pt states [facility]

## 2016-03-05 NOTE — Discharge Instructions (Signed)
Human Chorionic Gonadotropin Test °Human chorionic gonadotropin (hCG) is a hormone produced during pregnancy by the cells that form the placenta. The placenta is the organ that grows inside your womb (uterus) to nourish a developing baby. When you are pregnant, hCG starts to appear in your blood about 11 days after conception. It continues to go up for the first 8-11 weeks of pregnancy.  °Your hCG level can be measured with several different types of tests. You may have: °· A urine test. °¨ hCG is eliminated from your body by your kidneys, so a urine test is one way to check for this hormone. °¨ A urine test only shows whether there is hCG in your urine. It does not measure how much. °¨ You may have a urine test to find out whether you are pregnant. °¨ A home pregnancy test detects whether there is hCG in your urine. °· A qualitative blood test. °¨ Like the urine test, this blood test only shows whether there is hCG in your blood. It does not measure how much. °¨ You may have this type of blood test to find out whether you are pregnant. °· A quantitative blood test. °¨ This type of blood test measures the amount of hCG in your blood. °¨ You may have this type of test to diagnose an abnormal pregnancy or determine whether you are at risk of, or have had, a failed pregnancy (miscarriage). °PREPARATION FOR TEST °For the urine test: °· Limit your fluid intake before the urine test as directed by your health care provider. °· Collect the sample the first time you urinate in the morning. °· Let your health care provider know if you have blood in your urine. This may interfere with the test result. °Some medicines may interfere with the urine and blood tests. Let your health care provider know about all the medicines you are taking. No additional preparation is required for the blood test.  °RESULTS °It is your responsibility to obtain your test results. Ask the lab or department performing the test when and how you will  get your results. Talk to your health care provider if you have any questions about your test results. °The results of the hCG urine test and the qualitative hCG blood test are either positive or negative. The results of the quantitative hCG blood test are reported as a number. hCG is measured in international units per liter (IU/L). °Meaning of Negative Test Results °A negative result on a urine or qualitative blood test could mean that you are not pregnant. It could also mean the test was done too early to detect hCG. If you still have other signs of pregnancy, the test should be repeated. °Meaning of Positive Test Results °A positive result on the urine or qualitative blood tests means you are most likely pregnant. Your health care provider may confirm your pregnancy with an imaging study of the inside of your uterus at 5-6 weeks (ultrasound).  °Range of Normal Values °Ranges for normal values for the quantitative hCG blood test may vary among different labs and hospitals. You should always check with your health care provider after having lab work or other tests done to discuss whether your values are considered within normal limits.  °· Less than 5 IU/L means it is most likely you are not pregnant. °· Greater than 25 IU/L means it is most likely you are pregnant. °Meaning of Results Outside Normal Value Ranges °If your hCG level on the quantitative test is not   what would be expected, you may have the test again. It may also be important for your health care provider to know whether your hCG level goes up or down over time. Common causes of results outside the normal range include:  °· Being pregnant with twins (hCG level is higher than expected). °· Having an ectopic pregnancy (hCG rises more slowly than expected). °· Miscarriage (hCG level falls). °· Abnormal growths in the womb (hCG level is higher than expected). °  °This information is not intended to replace advice given to you by your health care  provider. Make sure you discuss any questions you have with your health care provider. °  °Document Released: 12/27/2004 Document Revised: 12/16/2014 Document Reviewed: 03/01/2014 °Elsevier Interactive Patient Education ©2016 [employer] ° °

## 2016-03-05 NOTE — ED Notes (Signed)
x1 attempt  LM on pt's VM [phone] Need to give lab results from recent visit on 3/24  Per Dr. Dayton ScrapeMurray,  Clinical staff, please let patient know that serum hcg should have returned to undetectable level (<1) by now, after pregnancy loss in late 12/2015.  Patient needs followup with Ob/GYN asap to assess for possible undiagnosed ectopic pregnancy, retained products of conception, or gestational trophoblastic disease ("molar pregnancy").  [facility] in [facility] past. LM  Will try later.

## 2016-03-12 ENCOUNTER — Ambulatory Visit: Payer: Self-pay | Admitting: Family

## 2016-03-12 ENCOUNTER — Encounter: Payer: Self-pay | Admitting: Advanced Practice Midwife

## 2016-03-12 ENCOUNTER — Telehealth: Payer: Self-pay | Admitting: *Deleted

## 2016-03-12 ENCOUNTER — Telehealth: Payer: Self-pay

## 2016-03-12 DIAGNOSIS — E349 Endocrine disorder, unspecified: Secondary | ICD-10-CM | POA: Insufficient documentation

## 2016-03-12 NOTE — Telephone Encounter (Signed)
I called patient to inform her that she missed her appointment for the stat hcg level. Patient answered phone and when I asked for her birthday to identify her she stated I'm not doing this and hung up.

## 2016-03-12 NOTE — Telephone Encounter (Signed)
Pt miss her appointment for her HCG. I have left a detailed message concerning the importance of coming back in for appointment.

## 2016-03-24 ENCOUNTER — Encounter (HOSPITAL_COMMUNITY): Payer: Self-pay | Admitting: Emergency Medicine

## 2016-03-24 ENCOUNTER — Emergency Department (HOSPITAL_COMMUNITY)
Admission: EM | Admit: 2016-03-24 | Discharge: 2016-03-24 | Disposition: A | Discharge status: Home / Self Care | Payer: Self-pay | Attending: Emergency Medicine | Admitting: Emergency Medicine

## 2016-03-24 DIAGNOSIS — Z87891 Personal history of nicotine dependence: Secondary | ICD-10-CM | POA: Insufficient documentation

## 2016-03-24 DIAGNOSIS — K002 Abnormalities of size and form of teeth: Secondary | ICD-10-CM | POA: Insufficient documentation

## 2016-03-24 DIAGNOSIS — K029 Dental caries, unspecified: Secondary | ICD-10-CM | POA: Insufficient documentation

## 2016-03-24 DIAGNOSIS — K0889 Other specified disorders of teeth and supporting structures: Secondary | ICD-10-CM | POA: Insufficient documentation

## 2016-03-24 MED ORDER — TRAMADOL HCL 50 MG PO TABS: 50.0000 mg | ORAL_TABLET | Freq: Four times a day (QID) | ORAL | Status: DC | PRN

## 2016-03-24 MED ORDER — PENICILLIN V POTASSIUM 500 MG PO TABS: 500.0000 mg | ORAL_TABLET | Freq: Four times a day (QID) | ORAL | Status: AC

## 2016-03-24 MED ORDER — OXYCODONE-ACETAMINOPHEN 5-325 MG PO TABS
1.0000 | ORAL_TABLET | ORAL | Status: DC | PRN
  Administered 2016-03-24: 1 via ORAL
  Filled 2016-03-24: qty 1

## 2016-03-24 MED ORDER — BUPIVACAINE-EPINEPHRINE (PF) 0.5% -1:200000 IJ SOLN
1.8000 mL | Freq: Once | INTRAMUSCULAR | Status: AC
  Administered 2016-03-24: 1.8 mL

## 2016-03-24 NOTE — ED Notes (Signed)
Family member to desk upset that pt is in pain and hasn't been seen. Percocet given from protocol and heating pack applied to L side of face.

## 2016-03-24 NOTE — ED Provider Notes (Signed)
CSN: [id number]     Arrival date & time 03/24/16  0402 History   First MD Initiated Contact with Patient 03/24/16 0559     Chief Complaint  Patient presents with  . Dental Pain     (Consider location/radiation/quality/duration/timing/severity/associated sxs/prior Treatment) HPI    Angela Manning is a 30 y.o. female, patient with no pertinent past medical history, presenting to the ED with lower left tooth pain that began yesterday and worsened this morning. Pt states she has a Education officer, community, but they are closed until tomorrow. Patient denies trauma, fever/chills, nausea/vomiting, difficulty swallowing or breathing, or any other complaints.   Past Medical History  Diagnosis Date  . Pregnancy induced hypertension    Past Surgical History  Procedure Laterality Date  . Dilation and evacuation N/A 02/21/2013    Procedure: DILATATION AND EVACUATION;  Surgeon: Reva Bores, MD;  Location: [facility];  Service: Gynecology;  Laterality: N/A;   Family History  Problem Relation Age of Onset  . Hypertension Mother    Social History  Substance Use Topics  . Smoking status: Former Smoker -- 0.25 packs/day    Types: Cigarettes  . Smokeless tobacco: Former Neurosurgeon    Quit date: 01/08/2015  . Alcohol Use: No   OB History    Gravida Para Term Preterm AB TAB SAB Ectopic Multiple Living   Review of Systems  Constitutional: Negative for fever and chills.  HENT: Positive for dental problem. Negative for facial swelling, sore throat, trouble swallowing and voice change.   Gastrointestinal: Negative for nausea and vomiting.      Allergies  Flagyl  Home Medications   Prior to Admission medications   Medication Sig Start Date End Date Taking? Authorizing Provider  penicillin v potassium (VEETID) 500 MG tablet Take 1 tablet (500 mg total) by mouth 4 (four) times daily. 03/24/16 03/31/16  Shawn C Joy, PA-C  traMADol (ULTRAM) 50 MG tablet Take 1 tablet (50 mg total) by mouth every 6  (six) hours as needed. 03/24/16   Shawn C Joy, PA-C   BP 158/102 mmHg  Pulse 88  Temp(Src) 98.7 F (37.1 C) (Oral)  Resp 18  Ht  (1.702 m)  Wt 56.246 kg  BMI 19.42 kg/m2  SpO2 99%  LMP 02/27/2015 Physical Exam  Constitutional: She appears well-developed and well-nourished. No distress.  HENT:  Head: Normocephalic and atraumatic.  Right Ear: External ear normal.  Left Ear: External ear normal.  Poor dentition noted throughout with multiple dental caries. The left lower rear most molar with significant dental caries. No swelling or area of fluctuance that might suggest an abscess. No facial swelling.  Eyes: Conjunctivae are normal.  Neck: Normal range of motion. Neck supple.  Cardiovascular: Normal rate and regular rhythm.   Pulmonary/Chest: Effort normal.  Lymphadenopathy:    She has no cervical adenopathy.  Neurological: She is alert.  Skin: Skin is warm and dry. She is not diaphoretic.  Nursing note and vitals reviewed.   ED Course  .Nerve Block Date/Time: 03/24/2016 6:13 AM Performed by: Anselm Pancoast Authorized by: Anselm Pancoast Consent: Verbal consent obtained. Risks and benefits: risks, benefits and alternatives were discussed Consent given by: patient Patient understanding: patient states understanding of the procedure being performed Patient consent: the patient's understanding of the procedure matches consent given Procedure consent: procedure consent matches procedure scheduled Patient identity confirmed: arm band and verbally with patient Indications: pain relief  Body area: face/mouth Nerve: inferior alveolar Laterality: left Patient sedated: no Patient position: supine Needle gauge: 27 G Location technique: anatomical landmarks Local anesthetic: bupivacaine 0.5% with epinephrine Anesthetic total: 1.8 ml Outcome: pain improved Patient tolerance: Patient tolerated the procedure well with no immediate complications   (including critical care  time)  Medications  oxyCODONE-acetaminophen (PERCOCET/ROXICET) 5-325 MG per tablet 1 tablet (1 tablet Oral Given 03/24/16 0533)  bupivacaine-epinephrine (MARCAINE W/ EPI) 0.5% -1:200000 injection 1.8 mL (1.8 mLs Infiltration Given 03/24/16 0620)    MDM   Final diagnoses:  Tooth pain    Angela Manning presents with left lower tooth pain that began yesterday.  Patient has no signs of sepsis or systemic symptoms. No red flag symptoms. No signs of Ludwig's edema. Patient and patient's family member at the bedside were unhappy that the patient wasn't seen immediately, despite patient receiving Percocet per pain protocol. Patient responded well to the dental block. Patient advised to follow-up with a dentist as soon as possible, certainly within the timeframe she is taking the antibiotic. Return precautions discussed. Patient voiced understanding of these instructions, accepts the plan, and is comfortable with discharge.  Filed Vitals:   03/24/16 0450 03/24/16 0456 03/24/16 0515 03/24/16 0625  BP:   166/131 158/102  Pulse:  71 78 88  Temp:    98.7 F (37.1 C)  TempSrc:    Oral  Resp:  18  18  Height:      Weight:      SpO2: 100% 100% 100% 99%       Anselm PancoastShawn C Joy, PA-C 03/24/16 0642  Layla MawKristen N Ward, DO 03/24/16 [phone]

## 2016-03-24 NOTE — ED Notes (Signed)
Pt. reports left lower molar pain onset yesterday worse this morning .

## 2016-03-24 NOTE — Discharge Instructions (Signed)
You have been seen today for dental pain. You should follow up with a dentist as soon as possible, but certainly in the time frame you are taking the antibiotics. Please take all of your antibiotics until finished!   You may develop abdominal discomfort or diarrhea from the antibiotic.  You may help offset this with probiotics which you can buy or get in yogurt. Do not eat or take the probiotics until 2 hours after your antibiotic. Follow up with PCP as needed. Return to ED should symptoms worsen.  RESOURCE GUIDE  Chronic Pain Problems: Contact [facility]  [phone] Patients need to be referred by their primary care doctor.  Insufficient Money for Medicine: Contact United Way:  call "211" or [facility] [phone].  No Primary Care Doctor: - Call Health Connect  [phone] - can help you locate a primary care doctor that  accepts your insurance, provides certain services, etc. - Physician Referral Service- [phone]  Agencies that provide inexpensive medical care: - [facility]  [phone] - [facility]  [phone] - [facility]  [phone] - Women's Clinic  [phone] - [facility]  [phone] Haynes Bast[facility]  [phone]  Medicaid-accepting [place] Providers: - [facility]- [address], [address]  [phone], Mon-Fri 9am-7pm, Sat 9am-1pm - [facility]- [address], [address] [place]  [phone] - [facility]- [address], [address] [place]  [phone] [facility]- [address] [place] [address]  [phone] - Renaye RakersVeita Bland- [address], [address], [phone]  Only accepts [place] patients after they have their name  applied to their card  Self Pay (no insurance) in [place]: - Sickle Cell Patients: Dr Willey BladeEric Dean, [facility]  [address], [phone] - [facility]- [address]  [phone]       Redge Gainer-     [facility] [place]- [address], [address]       -     [facility]- see information above (Speak to CitigroupPam H if you do not have insurance)       -  [facility]- [address], [phone]       -  [facility]- [address],  [phone]       -  [facility]- [address] [place] [address], [phone]       -  Dr Julio Sickssei-Bonsu-  [address], [address], [place], [phone]       -  [facility]- [address], [phone]       -  [facility] [place]- [address] [place] [address], [phone], also [address], [phone]       -    [facility]- [address], [phone], 1st & 3rd Saturday   every month, 10am-1pm  1) Find a Doctor and Pay Out of Pocket Although you won't have to find out who is covered by your insurance plan, it is a good idea to ask around and get recommendations. You will then need to call the office and see if the doctor you have chosen will accept you as a new patient and what types of options they offer for patients who are self-pay. Some doctors offer discounts or will set up payment plans for their patients  who do not have insurance, but you will need to ask so you aren't surprised when you get to your appointment.  2) Contact Your Local Health Department Not all health departments have doctors that can see patients for sick visits, but many do, so it is worth a call to see if yours does. If you don't know where your local health department is, you can check in your phone book. The CDC also has a tool to help you locate your state's health department, and many state websites also have listings of all of their local health departments.  3) Find a Walk-in Clinic If your illness is not likely to be very severe or complicated, you may want to try a walk in clinic. These are popping up all over the country in pharmacies, drugstores, and shopping centers. They're  usually staffed by nurse practitioners or physician assistants that have been trained to treat common illnesses and complaints. They're usually fairly quick and inexpensive. However, if you have serious medical issues or chronic medical problems, these are probably not your best option  STD Testing - [facility] of [facility] [place], STD Clinic, [address] [place], phone [phone] or [phone].  Monday - Friday, call for an appointment. [facility] Department of [employer], STD Clinic, [address], [place], phone [phone] or [phone].  Monday - Friday, call for an appointment.  Abuse/Neglect: [facility] Child Abuse Hotline 408-430-2854 [facility] Child Abuse Hotline 514-481-8339 (After Hours)  Emergency Shelter:  [facility] 914-045-0131  Maternity Homes: - [facility] [phone] - [facility] [phone]  MRSA Hotline #:   4636336525  [facility]  [facility]  [facility] Dept. [address]                 [address]         [address] [place] [address]  Blondell Reveal Phone:  [phone]                                  Phone:  [phone]                   Phone:  [phone]  [facility] Mental Health, [phone] - [phone]       -     [facility] in [place], [address],                                  [phone], Insurance  [place] Child Abuse Hotline 231 665 0204 or 215-851-3070 (After Hours)   Behavioral Health Services  Substance Abuse Resources: - [facility]  [phone] - [facility] [phone] - [facility] [phone] -  Floydene Flock  [phone] - Residential & Outpatient Substance Abuse Program  [phone]  Psychological Services: [facility]  [phone] Services  [phone] - [facility], [phone] [place]. [address], [place], ACCESS LINE: [phone] or [phone], [employer]an[employer].za  Dental Assistance  If unable to pay or uninsured, contact:  [facility] or [facility]. to become qualified for the adult dental clinic.  Patients with Medicaid: [facility] [phone] [address], [phone] [address] [phone]  If unable to pay, or uninsured, contact [facility] [phone]) or [facility] Department [phone] in [place], [phone] in [facility]) to become qualified for the adult dental clinic   Other Low-Cost Community Dental Services: - [facility]- [address] [place], [place], [place], [address], [phone], [address]. [phone], 2nd and 4th Thursday of the month at 6:30am.  10 clients each day by appointment, can sometimes see walk-in patients if someone does not show for an appointment. [facility]- [address] [place], [place], [address], [phone] - [facility]- [address] [place], [place], [address], [phone] - [facility]- [phone] [facility] Health Department- [phone] [facility] Department- [phone]

## 2016-05-14 ENCOUNTER — Encounter (HOSPITAL_COMMUNITY): Payer: Self-pay | Admitting: Emergency Medicine

## 2016-05-14 ENCOUNTER — Ambulatory Visit (HOSPITAL_COMMUNITY)
Admission: EM | Admit: 2016-05-14 | Discharge: 2016-05-14 | Disposition: A | Discharge status: Home / Self Care | Payer: Self-pay | Attending: Emergency Medicine | Admitting: Emergency Medicine

## 2016-05-14 DIAGNOSIS — I10 Essential (primary) hypertension: Secondary | ICD-10-CM

## 2016-05-14 DIAGNOSIS — B9689 Other specified bacterial agents as the cause of diseases classified elsewhere: Secondary | ICD-10-CM

## 2016-05-14 DIAGNOSIS — A499 Bacterial infection, unspecified: Secondary | ICD-10-CM

## 2016-05-14 DIAGNOSIS — N760 Acute vaginitis: Secondary | ICD-10-CM

## 2016-05-14 LAB — POCT URINALYSIS DIP (DEVICE)
Bilirubin Urine: NEGATIVE
Glucose, UA: NEGATIVE mg/dL
Ketones, ur: NEGATIVE mg/dL
Nitrite: NEGATIVE
Protein, ur: 30 mg/dL — AB
Specific Gravity, Urine: >=1.03 (ref 1.005–1.030)
Urobilinogen, UA: 0.2 mg/dL (ref 0.0–1.0)
pH: 6 (ref 5.0–8.0)

## 2016-05-14 LAB — POCT PREGNANCY, URINE: Preg Test, Ur: NEGATIVE

## 2016-05-14 MED ORDER — CLINDAMYCIN HCL 300 MG PO CAPS: 300.0000 mg | ORAL_CAPSULE | Freq: Three times a day (TID) | ORAL | Status: DC

## 2016-05-14 MED ORDER — HYDROCHLOROTHIAZIDE 12.5 MG PO TABS: 12.5000 mg | ORAL_TABLET | Freq: Every day | ORAL | Status: DC

## 2016-05-14 NOTE — Discharge Instructions (Signed)
I sent a prescription for clindamycin for the BV. If this does not resolve your symptoms, please come back so we can do a pelvic exam.  I also sent in a prescription for hydrochlorothiazide. This is a medicine for your blood pressure. Take 1 pill a day. Please follow-up at the health department for monitoring of your blood pressure.

## 2016-05-14 NOTE — ED Provider Notes (Signed)
CSN: [id number]     Arrival date & time 05/14/16  1728 History   First MD Initiated Contact with Patient 05/14/16 1757     Chief Complaint  Patient presents with  . bacterial vaginosis    (Consider location/radiation/quality/duration/timing/severity/associated sxs/prior Treatment) HPI She is a 30 year old woman here for BV. This is a recurrent problem for her. She states her current symptoms are exactly the same as prior episodes. She reports several days of discharge with odor as well as slight discomfort with urination. Again, this is typical of her BV. No abdominal pain. No new sexual partners. She declines STD testing. No fevers or chills.  Her blood pressure is elevated today. This is been elevated all prior visits as well. She does have a history of pregnancy-induced hypertension. She denies any chest pain or shortness of breath. No headaches or vision changes.  Past Medical History  Diagnosis Date  . Pregnancy induced hypertension    Past Surgical History  Procedure Laterality Date  . Dilation and evacuation N/A 02/21/2013    Procedure: DILATATION AND EVACUATION;  Surgeon: Reva Boresanya S Pratt, MD;  Location: [facility];  Service: Gynecology;  Laterality: N/A;   Family History  Problem Relation Age of Onset  . Hypertension Mother    Social History  Substance Use Topics  . Smoking status: Current Every Day Smoker -- 0.25 packs/day    Types: Cigarettes  . Smokeless tobacco: Former NeurosurgeonUser    Quit date: 01/08/2015  . Alcohol Use: No   OB History    Gravida Para Term Preterm AB TAB SAB Ectopic Multiple Living   5 4 3 1      4      Review of Systems As in history of present illness Allergies  Flagyl  Home Medications   Prior to Admission medications   Medication Sig Start Date End Date Taking? Authorizing Provider  clindamycin (CLEOCIN) 300 MG capsule Take 1 capsule (300 mg total) by mouth 3 (three) times daily. 05/14/16   Charm RingsErin J Bryttany Tortorelli, MD  hydrochlorothiazide (HYDRODIURIL) 12.5 MG  tablet Take 1 tablet (12.5 mg total) by mouth daily. 05/14/16   Charm RingsErin J Zaleigh Bermingham, MD   Meds Ordered and Administered this Visit  Medications - No data to display  BP 164/119 mmHg  Pulse 83  Temp(Src) 98.4 F (36.9 C) (Oral)  Resp 14  SpO2 100%  LMP 04/11/2016 (Exact Date) No data found.   Physical Exam  Constitutional: She is oriented to person, place, and time. She appears well-developed and well-nourished. No distress.  Cardiovascular: Normal rate.   Pulmonary/Chest: Effort normal.  Genitourinary:  Deferred  Neurological: She is alert and oriented to person, place, and time.    ED Course  Procedures (including critical care time)  Labs Review Labs Reviewed  POCT URINALYSIS DIP (DEVICE) - Abnormal; Notable for the following:    Hgb urine dipstick TRACE (*)    Protein, ur 30 (*)    Leukocytes, UA LARGE (*)    All other components within normal limits  POCT PREGNANCY, URINE    Imaging Review No results found.   MDM   1. BV (bacterial vaginosis)   2. Essential hypertension    We'll treat with clindamycin, which typically resolves her symptoms. She will follow-up if she has persistent symptoms. I did start HCTZ 12.5 mg daily for elevated blood pressure. I recommended follow-up at the health department.    Charm RingsErin J Afomia Blackley, MD 05/14/16 [phone]

## 2016-05-14 NOTE — ED Notes (Signed)
Pt has had a recurrent issue with BV.  She is here today with complaints of burning with urination and discharge with odor.  She states these are the same symptoms she gets every time.

## 2016-09-20 ENCOUNTER — Ambulatory Visit (HOSPITAL_COMMUNITY)
Admission: EM | Admit: 2016-09-20 | Discharge: 2016-09-20 | Disposition: A | Discharge status: Home / Self Care | Payer: Self-pay | Attending: Emergency Medicine | Admitting: Emergency Medicine

## 2016-09-20 ENCOUNTER — Encounter (HOSPITAL_COMMUNITY): Payer: Self-pay | Admitting: *Deleted

## 2016-09-20 DIAGNOSIS — N898 Other specified noninflammatory disorders of vagina: Secondary | ICD-10-CM | POA: Insufficient documentation

## 2016-09-20 DIAGNOSIS — N760 Acute vaginitis: Secondary | ICD-10-CM

## 2016-09-20 DIAGNOSIS — B9689 Other specified bacterial agents as the cause of diseases classified elsewhere: Secondary | ICD-10-CM

## 2016-09-20 DIAGNOSIS — Z8619 Personal history of other infectious and parasitic diseases: Secondary | ICD-10-CM | POA: Insufficient documentation

## 2016-09-20 MED ORDER — CLINDAMYCIN HCL 300 MG PO CAPS: 300.0000 mg | ORAL_CAPSULE | Freq: Two times a day (BID) | ORAL | 0 refills | Status: DC

## 2016-09-20 NOTE — Discharge Instructions (Signed)
°  [facility] °[phone] °[address] °[place] [address] ° °[facility]: [address] °[place] [address]  °[phone] ° °[facility]: [address] °[place] [address]   °[phone] ° °[facility]: [address] °[place] [address]  °[phone] ° °[facility] : [address] [place] [address] °[phone] ° °[facility]: [address] °[place] [address] °[phone] ° °[facility]: [address] °[place] [address] °[phone] ° °Dr. Mahima Pandey [address] [facility] [place]  [address]  °[phone] ° °Dr. George Osei-Bonsu, [facility]. [address] [place], [place] [address]  °[phone] ° ° ° °

## 2016-09-20 NOTE — ED Provider Notes (Signed)
HPI  SUBJECTIVE:  Angela Manning is a 30 y.o. female who presents with vaginal odor and low pelvic cramping starting today. She states that this is identical to previous episodes of BV. States that the BV usually responds well to clindamycin. States that she has difficulty tolerating Flagyl. There are no aggravating or alleviating factors. She has not tried anything for this. She denies fevers, nausea, vomiting, abdominal pain, back pain, general rash, vaginal itching, vaginal discharge, vaginal bleeding. No urinary complaints. She states that she has not been sexually active since 05/22/2016. STDs are not a concern today. No perfumed body washes or soaps. She has not been on antibiotics recently. Past medical history of recurrent BV, remote history of chlamydia, hypertension. No history of gonorrhea, HIV, HSV, syphilis, Trichomonas, yeast infections, PID, ectopic pregnancies or diabetes. LMP: 9/30. She denies possibility of being pregnant and states that we do not need to check. PMD: None.    Past Medical History:  Diagnosis Date  . Pregnancy induced hypertension     Past Surgical History:  Procedure Laterality Date  . DILATION AND EVACUATION N/A 02/21/2013   Procedure: DILATATION AND EVACUATION;  Surgeon: Reva Boresanya S Pratt, MD;  Location: [facility];  Service: Gynecology;  Laterality: N/A;    Family History  Problem Relation Age of Onset  . Hypertension Mother     Social History  Substance Use Topics  . Smoking status: Current Every Day Smoker    Packs/day: 0.25    Types: Cigarettes  . Smokeless tobacco: Former NeurosurgeonUser    Quit date: 01/08/2015  . Alcohol use No    No current facility-administered medications for this encounter.   Current Outpatient Prescriptions:  .  clindamycin (CLEOCIN) 300 MG capsule, Take 1 capsule (300 mg total) by mouth 2 (two) times daily. X 7 days, Disp: 14 capsule, Rfl: 0 .  hydrochlorothiazide (HYDRODIURIL) 12.5 MG tablet, Take 1 tablet (12.5 mg total) by mouth  daily., Disp: 30 tablet, Rfl: 2  Allergies  Allergen Reactions  . Flagyl [Metronidazole] Rash     ROS  As noted in HPI.   Physical Exam  BP 127/90 (BP Location: Left Arm)   Pulse 76   Temp 98 F (36.7 C) (Oral)   Resp 12   LMP 09/20/2016   SpO2 100%   Constitutional: Well developed, well nourished, no acute distress Eyes:  EOMI, conjunctiva normal bilaterally HENT: Normocephalic, atraumatic,mucus membranes moist Respiratory: Normal inspiratory effort Cardiovascular: Normal rate GI: nondistended Flat, nontender. No suprapubic tenderness Back: No CVA tenderness GU deferred per patient request skin: No rash, skin intact Musculoskeletal: no deformities Neurologic: Alert & oriented x 3, no focal neuro deficits Psychiatric: Speech and behavior appropriate   ED Course   Medications - No data to display  No orders of the defined types were placed in this encounter.   No results found for this or any previous visit (from the past 24 hour(s)). No results found.  ED Clinical Impression  BV (bacterial vaginosis)  ED Assessment/Plan  Patient declined pelvic, states that she has had BV multiple times, STDs are not a concern today, denies being sexually active since June. She is comfortable with the idea of doing urine cytology only. Gonorrhea, chlamydia, Trichomonas, BV and yeast were sent. She declined testing for HIV or syphilis. We'll send home with clindamycin as she states that this works well for her. 300 mg by mouth twice a day for 7 days.  We'll provide primary care referral list from routine care.  Discussed  MDM, plan and followup with patient.  Patientagrees with plan.   Meds ordered this encounter  Medications  . clindamycin (CLEOCIN) 300 MG capsule    Sig: Take 1 capsule (300 mg total) by mouth 2 (two) times daily. X 7 days    Dispense:  14 capsule    Refill:  0    *This clinic note was created using Scientist, clinical (histocompatibility and immunogenetics). Therefore, there may be  occasional mistakes despite careful proofreading.  ?   Domenick Gong, MD 09/20/16 1510

## 2016-09-20 NOTE — ED Triage Notes (Signed)
Pt  Reports   Symptoms  Of  Vaginal      Odor      Today  After   She  Wiped    She   Reports  A  History  Of   Vaginal  Infections  In  Past

## 2016-09-23 LAB — URINE CYTOLOGY ANCILLARY ONLY
Chlamydia: NEGATIVE
Neisseria Gonorrhea: NEGATIVE
Trichomonas: NEGATIVE

## 2016-09-25 LAB — URINE CYTOLOGY ANCILLARY ONLY: Candida vaginitis: NEGATIVE

## 2016-09-30 ENCOUNTER — Telehealth (HOSPITAL_COMMUNITY): Payer: Self-pay | Admitting: Emergency Medicine

## 2016-09-30 NOTE — Telephone Encounter (Signed)
Called pt and notified of recent lab results Pt ID'd properly... Reports feeling better and sx have subsided  Finished and tolerated well clindamycin w/no problems Adv pt if sx are not getting better to return or to f/u w/PCP Education on safe sex given Pt verb understanding.

## 2016-09-30 NOTE — Telephone Encounter (Signed)
-----   Message from Eustace MooreLaura W Murray, MD sent at 09/26/2016 11:46 AM EDT ----- Please let patient know that test for gardnerella (bacterial vaginosis) was positive.  [facility]  Recheck for further evaluation if symptoms persist.  LM

## 2016-11-20 ENCOUNTER — Ambulatory Visit (HOSPITAL_COMMUNITY)
Admission: EM | Admit: 2016-11-20 | Discharge: 2016-11-20 | Disposition: A | Discharge status: Home / Self Care | Payer: Self-pay | Attending: Family Medicine | Admitting: Family Medicine

## 2016-11-20 ENCOUNTER — Encounter (HOSPITAL_COMMUNITY): Payer: Self-pay | Admitting: Emergency Medicine

## 2016-11-20 DIAGNOSIS — N83202 Unspecified ovarian cyst, left side: Secondary | ICD-10-CM

## 2016-11-20 DIAGNOSIS — N760 Acute vaginitis: Secondary | ICD-10-CM

## 2016-11-20 DIAGNOSIS — B9689 Other specified bacterial agents as the cause of diseases classified elsewhere: Secondary | ICD-10-CM

## 2016-11-20 LAB — POCT URINALYSIS DIP (DEVICE)
Bilirubin Urine: NEGATIVE
Glucose, UA: NEGATIVE mg/dL
Hgb urine dipstick: NEGATIVE
Ketones, ur: NEGATIVE mg/dL
Leukocytes, UA: NEGATIVE
Nitrite: NEGATIVE
Protein, ur: NEGATIVE mg/dL
Specific Gravity, Urine: 1.02 (ref 1.005–1.030)
Urobilinogen, UA: 0.2 mg/dL (ref 0.0–1.0)
pH: 6.5 (ref 5.0–8.0)

## 2016-11-20 LAB — POCT PREGNANCY, URINE: Preg Test, Ur: NEGATIVE

## 2016-11-20 MED ORDER — NAPROXEN 500 MG PO TABS: 500.0000 mg | ORAL_TABLET | Freq: Two times a day (BID) | ORAL | 0 refills | Status: DC

## 2016-11-20 MED ORDER — CLINDAMYCIN HCL 300 MG PO CAPS: ORAL_CAPSULE | ORAL | 0 refills | Status: DC

## 2016-11-20 NOTE — ED Provider Notes (Signed)
CSN: [id number]     Arrival date & time 11/20/16  1713 History   First MD Initiated Contact with Patient 11/20/16 1829     Chief Complaint  Patient presents with  . Abdominal Pain   (Consider location/radiation/quality/duration/timing/severity/associated sxs/prior Treatment) Patient states she is having vaginal discharge and it is BV because she gets this chronically.  She states she has left ovary cyst and is having some pain.   The history is provided by the patient.  Abdominal Pain  Pain location:  RLQ Pain quality: aching   Pain radiates to:  LLQ Pain severity:  Moderate Onset quality:  Sudden Duration:  7 days Timing:  Intermittent Progression:  Worsening Chronicity:  New Relieved by:  None tried Worsened by:  Nothing Ineffective treatments:  None tried Associated symptoms: vaginal discharge     Past Medical History:  Diagnosis Date  . Pregnancy induced hypertension    Past Surgical History:  Procedure Laterality Date  . DILATION AND EVACUATION N/A 02/21/2013   Procedure: DILATATION AND EVACUATION;  Surgeon: Reva Boresanya S Pratt, MD;  Location: [facility];  Service: Gynecology;  Laterality: N/A;   Family History  Problem Relation Age of Onset  . Hypertension Mother    Social History  Substance Use Topics  . Smoking status: Current Every Day Smoker    Packs/day: 0.25    Types: Cigarettes  . Smokeless tobacco: Former NeurosurgeonUser    Quit date: 01/08/2015  . Alcohol use No   OB History    Gravida Para Term Preterm AB Living   5 4 3 1   4    SAB TAB Ectopic Multiple Live Births                 Review of Systems  Constitutional: Negative.   HENT: Negative.   Eyes: Negative.   Respiratory: Negative.   Cardiovascular: Negative.   Gastrointestinal: Positive for abdominal pain.  Endocrine: Negative.   Genitourinary: Positive for pelvic pain and vaginal discharge.  Musculoskeletal: Negative.   Skin: Negative.   Allergic/Immunologic: Negative.   Neurological: Negative.    Hematological: Negative.   Psychiatric/Behavioral: Negative.     Allergies  Flagyl [metronidazole]  Home Medications   Prior to Admission medications   Medication Sig Start Date End Date Taking? Authorizing Provider  clindamycin (CLEOCIN) 300 MG capsule Take 1 capsule (300 mg total) by mouth 2 (two) times daily. X 7 days Patient not taking: Reported on 11/20/2016 09/20/16   Domenick GongAshley Mortenson, MD  clindamycin (CLEOCIN) 300 MG capsule One po bid x 7 days 11/20/16   Deatra CanterWilliam J Amoura Ransier, FNP  hydrochlorothiazide (HYDRODIURIL) 12.5 MG tablet Take 1 tablet (12.5 mg total) by mouth daily. 05/14/16   Charm RingsErin J Honig, MD  naproxen (NAPROSYN) 500 MG tablet Take 1 tablet (500 mg total) by mouth 2 (two) times daily with a meal. 11/20/16   Deatra CanterWilliam J Jasmond River, FNP   Meds Ordered and Administered this Visit  Medications - No data to display  Pulse 86   Temp 98.8 F (37.1 C) (Oral)   Resp 16   SpO2 100%  No data found.   Physical Exam  Constitutional: She appears well-developed and well-nourished.  HENT:  Head: Normocephalic and atraumatic.  Right Ear: External ear normal.  Left Ear: External ear normal.  Mouth/Throat: Oropharynx is clear and moist.  Eyes: Conjunctivae and EOM are normal. Pupils are equal, round, and reactive to light.  Neck: Normal range of motion. Neck supple.  Cardiovascular: Normal rate, regular rhythm and normal heart  sounds.   Pulmonary/Chest: Effort normal and breath sounds normal.  Abdominal: Soft. There is tenderness.  LLQ abdominal discomfort  Nursing note and vitals reviewed.   Urgent Care Course   Clinical Course     Procedures (including critical care time)  Labs Review Labs Reviewed  POCT URINALYSIS DIP (DEVICE)  POCT PREGNANCY, URINE    Imaging Review No results found.   Visual Acuity Review  Right Eye Distance:   Left Eye Distance:   Bilateral Distance:    Right Eye Near:   Left Eye Near:    Bilateral Near:         MDM   1. Cyst of  left ovary   2. BV (bacterial vaginosis)    Patient declines exam   Naprosyn 500mg  one po bid x 7 days #14 Clindamycin 300mg  one po bid x 7 days #14    Deatra CanterWilliam J Jaylee Lantry, FNP 11/20/16 [phone]

## 2016-11-20 NOTE — ED Triage Notes (Signed)
Patient reports history of BV and reports she noticed symptoms yesterday.  Patient has left lower abdominal pain and says she has a known cyst on ovary.

## 2017-03-12 ENCOUNTER — Encounter (HOSPITAL_COMMUNITY): Payer: Self-pay | Admitting: Family Medicine

## 2017-03-12 ENCOUNTER — Ambulatory Visit (HOSPITAL_COMMUNITY)
Admission: EM | Admit: 2017-03-12 | Discharge: 2017-03-12 | Disposition: A | Discharge status: Home / Self Care | Payer: Self-pay | Attending: Family Medicine | Admitting: Family Medicine

## 2017-03-12 DIAGNOSIS — B9689 Other specified bacterial agents as the cause of diseases classified elsewhere: Secondary | ICD-10-CM

## 2017-03-12 DIAGNOSIS — N760 Acute vaginitis: Secondary | ICD-10-CM

## 2017-03-12 MED ORDER — CLINDAMYCIN PHOSPHATE 2 % VA CREA: 1.0000 | TOPICAL_CREAM | Freq: Every day | VAGINAL | 0 refills | Status: DC

## 2017-03-12 MED ORDER — CLINDAMYCIN HCL 300 MG PO CAPS: ORAL_CAPSULE | ORAL | 0 refills | Status: DC

## 2017-03-12 NOTE — ED Triage Notes (Signed)
Pt here for vaginal discharge since yesterday. sts hx of same with BV.

## 2017-03-12 NOTE — ED Provider Notes (Signed)
CSN: [id number]     Arrival date & time 03/12/17  1228 History   None    Chief Complaint  Patient presents with  . Vaginal Discharge   (Consider location/radiation/quality/duration/timing/severity/associated sxs/prior Treatment) Patient c/o vaginitis.  She has hx of BV and she states she has fishy odor to her VAG DC   The history is provided by the patient.  Vaginal Discharge  Quality:  White Severity:  Mild Onset quality:  Sudden Duration:  2 days Timing:  Constant Progression:  Unchanged Chronicity:  New Relieved by:  Nothing Worsened by:  Nothing Ineffective treatments:  None tried   Past Medical History:  Diagnosis Date  . Pregnancy induced hypertension    Past Surgical History:  Procedure Laterality Date  . DILATION AND EVACUATION N/A 02/21/2013   Procedure: DILATATION AND EVACUATION;  Surgeon: Reva Bores, MD;  Location: [facility];  Service: Gynecology;  Laterality: N/A;   Family History  Problem Relation Age of Onset  . Hypertension Mother    Social History  Substance Use Topics  . Smoking status: Current Every Day Smoker    Packs/day: 0.25    Types: Cigarettes  . Smokeless tobacco: Former Neurosurgeon    Quit date: 01/08/2015  . Alcohol use No   OB History    Gravida Para Term Preterm AB Living   SAB TAB Ectopic Multiple Live Births                 Review of Systems  Constitutional: Negative.   HENT: Negative.   Eyes: Negative.   Respiratory: Negative.   Cardiovascular: Negative.   Gastrointestinal: Negative.   Endocrine: Negative.   Genitourinary: Positive for vaginal discharge.  Musculoskeletal: Negative.   Allergic/Immunologic: Negative.   Neurological: Negative.   Hematological: Negative.   Psychiatric/Behavioral: Negative.     Allergies  Flagyl [metronidazole]  Home Medications   Prior to Admission medications   Medication Sig Start Date End Date Taking? Authorizing Provider  clindamycin (CLEOCIN) 2 % vaginal cream Place 1  Applicatorful vaginally at bedtime. 03/12/17   Deatra Canter, FNP   Meds Ordered and Administered this Visit  Medications - No data to display  BP (!) 137/95 (BP Location: Right Arm)   Pulse 74   Temp 98 F (36.7 C) (Oral)   Resp 18   LMP 03/04/2017   SpO2 100%  No data found.   Physical Exam  Constitutional: She appears well-developed and well-nourished.  HENT:  Head: Normocephalic and atraumatic.  Eyes: Conjunctivae and EOM are normal. Pupils are equal, round, and reactive to light.  Neck: Normal range of motion. Neck supple.  Cardiovascular: Normal rate, regular rhythm and normal heart sounds.   Pulmonary/Chest: Effort normal and breath sounds normal.  Nursing note and vitals reviewed.   Urgent Care Course     Procedures (including critical care time)  Labs Review Labs Reviewed - No data to display  Imaging Review No results found.   Visual Acuity Review  Right Eye Distance:   Left Eye Distance:   Bilateral Distance:    Right Eye Near:   Left Eye Near:    Bilateral Near:         MDM   1. BV (bacterial vaginosis)    Clindamycin vaginal cream one application qhs #40gm      Deatra Canter, FNP 03/12/17 1258    Deatra Canter, FNP 03/12/17 [phone]

## 2017-04-24 ENCOUNTER — Encounter (HOSPITAL_COMMUNITY): Payer: Self-pay

## 2017-04-24 DIAGNOSIS — S4992XA Unspecified injury of left shoulder and upper arm, initial encounter: Secondary | ICD-10-CM | POA: Insufficient documentation

## 2017-04-24 DIAGNOSIS — Y999 Unspecified external cause status: Secondary | ICD-10-CM | POA: Insufficient documentation

## 2017-04-24 DIAGNOSIS — F17210 Nicotine dependence, cigarettes, uncomplicated: Secondary | ICD-10-CM | POA: Insufficient documentation

## 2017-04-24 DIAGNOSIS — Y929 Unspecified place or not applicable: Secondary | ICD-10-CM | POA: Insufficient documentation

## 2017-04-24 DIAGNOSIS — Y939 Activity, unspecified: Secondary | ICD-10-CM | POA: Insufficient documentation

## 2017-04-24 DIAGNOSIS — W1839XA Other fall on same level, initial encounter: Secondary | ICD-10-CM | POA: Insufficient documentation

## 2017-04-24 NOTE — ED Triage Notes (Signed)
Pt endorses having a mechanical fall yesterday and hurt her left shoulder. Pt woke up this morning and pain was worse. Pt has full rom and cms intact. VSS.

## 2017-04-25 ENCOUNTER — Emergency Department (HOSPITAL_COMMUNITY)
Admission: EM | Admit: 2017-04-25 | Discharge: 2017-04-25 | Disposition: A | Discharge status: Home / Self Care | Payer: Self-pay | Attending: Emergency Medicine | Admitting: Emergency Medicine

## 2017-04-25 ENCOUNTER — Emergency Department (HOSPITAL_COMMUNITY): Payer: Self-pay

## 2017-04-25 DIAGNOSIS — M25512 Pain in left shoulder: Secondary | ICD-10-CM

## 2017-04-25 MED ORDER — IBUPROFEN 800 MG PO TABS: 800.0000 mg | ORAL_TABLET | Freq: Three times a day (TID) | ORAL | 0 refills | Status: DC

## 2017-04-25 MED ORDER — HYDROCODONE-ACETAMINOPHEN 5-325 MG PO TABS: 1.0000 | ORAL_TABLET | Freq: Four times a day (QID) | ORAL | 0 refills | Status: DC | PRN

## 2017-04-25 NOTE — ED Notes (Signed)
Patient transported to x-ray. ?

## 2017-04-25 NOTE — ED Provider Notes (Signed)
[facility]ENCY DEPT Provider Note   CSN: [id number] Arrival date & time: 04/24/17  2312     History   Chief Complaint Chief Complaint  Patient presents with  . Fall  . Shoulder Pain    HPI Angela Manning is a 31 y.o. female.  Patient presents to the ED with a chief complaint of left shoulder pain.  She states that she fell and landed on her arm yesterday.  She reports that the pain had worsened when she awoke this morning.  She reports a hx of prior dislocation to this arm.  She has not taken anything for her symptoms.  The symptoms are worsened with movement.  She denies any numbness, weakness, or tingling.   The history is provided by the patient. No language interpreter was used.    Past Medical History:  Diagnosis Date  . Pregnancy induced hypertension     Patient Active Problem List   Diagnosis Date Noted  . Elevated serum hCG 03/12/2016  . Ovarian cyst, left 03/01/2015  . Abnormal uterine bleeding (AUB) 03/01/2015  . Dyspareunia 03/01/2015  . Smoker 10/23/2014  . Retained products of conception with hemorrhage 02/21/2013    Past Surgical History:  Procedure Laterality Date  . DILATION AND EVACUATION N/A 02/21/2013   Procedure: DILATATION AND EVACUATION;  Surgeon: Reva Bores, MD;  Location: [facility];  Service: Gynecology;  Laterality: N/A;    OB History    Gravida Para Term Preterm AB Living   5 4 3 1   4    SAB TAB Ectopic Multiple Live Births                   Home Medications    Prior to Admission medications   Medication Sig Start Date End Date Taking? Authorizing Provider  clindamycin (CLEOCIN) 2 % vaginal cream Place 1 Applicatorful vaginally at bedtime. 03/12/17   Deatra Canter, FNP  clindamycin (CLEOCIN) 300 MG capsule Take one po bid x 7 days 03/12/17   Deatra Canter, FNP    Family History Family History  Problem Relation Age of Onset  . Hypertension Mother     Social History Social History  Substance Use Topics  . Smoking  status: Current Every Day Smoker    Packs/day: 0.25    Types: Cigarettes  . Smokeless tobacco: Former Neurosurgeon    Quit date: 01/08/2015  . Alcohol use 0.0 oz/week     Comment: occ     Allergies   Flagyl [metronidazole]   Review of Systems Review of Systems  All other systems reviewed and are negative.    Physical Exam Updated Vital Signs BP (!) 144/104   Pulse 73   Temp 97.9 F (36.6 C) (Oral)   Resp 16   Ht 5\' 7"  (1.702 m)   Wt 140 lb (63.5 kg)   LMP 03/31/2017 (Exact Date)   SpO2 98%   Breastfeeding? No   BMI 21.93 kg/m   Physical Exam Nursing note and vitals reviewed.  Constitutional: Pt appears well-developed and well-nourished. No distress.  HENT:  Head: Normocephalic and atraumatic.  Eyes: Conjunctivae are normal.  Neck: Normal range of motion.  Cardiovascular: Normal rate, regular rhythm. Intact distal pulses.   Capillary refill < 3 sec.  Pulmonary/Chest: Effort normal and breath sounds normal.  Musculoskeletal:  Left shoulder pain Pt exhibits no focal tenderness or deformity.   ROM: passive 5/5, active limited 2/2 pain  Strength: limited by pain  Neurological: Pt  is alert. Coordination normal.  Sensation: 5/5 Skin: Skin is warm and dry. Pt is not diaphoretic.  No evidence of open wound or skin tenting Psychiatric: Pt has a normal mood and affect.     ED Treatments / Results  Labs (all labs ordered are listed, but only abnormal results are displayed) Labs Reviewed - No data to display  EKG  EKG Interpretation None       Radiology Dg Shoulder Left  Result Date: 04/25/2017 CLINICAL DATA:  Left shoulder pain for 1 day, limited range of motion. Patient reports prior dislocation. EXAM: LEFT SHOULDER - 2+ VIEW COMPARISON:  Radiographs 01/24/2015 FINDINGS: No definite dislocation, humeral head slightly more anteriorly than on prior exam on the transscapular Y-view may be positioning versus subluxation. Probable Hill-Sachs impaction injury to the  lateral humeral head, of uncertain chronicity. Well corticated curvilinear osseous density about the inferior aspect of the glenohumeral joint, not present on prior exam. Acromioclavicular joint is congruent. IMPRESSION: 1. No frank dislocation, possible anterior subluxation of the humeral head versus positioning. 2. Findings suggest sequela of prior shoulder dislocation with probable Hill-Sachs impaction injury at the lateral humeral head. A well corticated curvilinear density about the inferior glenohumeral joint may be a bony [place], appears remote but new from 2016. Electronically Signed   By: Rubye OaksMelanie  Ehinger M.D.   On: 04/25/2017 00:29    Procedures Procedures (including critical care time)  Medications Ordered in ED Medications - No data to display   Initial Impression / Assessment and Plan / ED Course  I have reviewed the triage vital signs and the nursing notes.  Pertinent labs & imaging results that were available during my care of the patient were reviewed by me and considered in my medical decision making (see chart for details).     Patient X-Ray negative for obvious fracture or frank dislocation.  Doubt subluxation, normal passive ROM.  Pt advised to follow up with orthopedics. Patient given sling while in ED, conservative therapy recommended and discussed. Patient will be discharged home & is agreeable with above plan. Returns precautions discussed. Pt appears safe for discharge.   Final Clinical Impressions(s) / ED Diagnoses   Final diagnoses:  Acute pain of left shoulder    New Prescriptions New Prescriptions   HYDROCODONE-ACETAMINOPHEN (NORCO/VICODIN) 5-325 MG TABLET    Take 1 tablet by mouth every 6 (six) hours as needed.   IBUPROFEN (ADVIL,MOTRIN) 800 MG TABLET    Take 1 tablet (800 mg total) by mouth 3 (three) times daily.     Roxy HorsemanBrowning, Gaynel Schaafsma, PA-C 04/25/17 0136    Dione BoozeGlick, David, MD 04/25/17 [phone]

## 2017-05-09 ENCOUNTER — Encounter (HOSPITAL_COMMUNITY): Payer: Self-pay

## 2017-05-09 ENCOUNTER — Ambulatory Visit (HOSPITAL_COMMUNITY)
Admission: EM | Admit: 2017-05-09 | Discharge: 2017-05-09 | Disposition: A | Discharge status: Home / Self Care | Payer: Self-pay

## 2017-05-09 DIAGNOSIS — Z3202 Encounter for pregnancy test, result negative: Secondary | ICD-10-CM

## 2017-05-09 DIAGNOSIS — Z8679 Personal history of other diseases of the circulatory system: Secondary | ICD-10-CM

## 2017-05-09 DIAGNOSIS — F17210 Nicotine dependence, cigarettes, uncomplicated: Secondary | ICD-10-CM

## 2017-05-09 DIAGNOSIS — R030 Elevated blood-pressure reading, without diagnosis of hypertension: Secondary | ICD-10-CM | POA: Diagnosis present

## 2017-05-09 DIAGNOSIS — R110 Nausea: Secondary | ICD-10-CM | POA: Diagnosis present

## 2017-05-09 LAB — POCT PREGNANCY, URINE: Preg Test, Ur: NEGATIVE

## 2017-05-09 MED ORDER — ONDANSETRON 4 MG PO TBDP
4.0000 mg | ORAL_TABLET | Freq: Once | ORAL | Status: AC
  Administered 2017-05-09: 4 mg via ORAL

## 2017-05-09 MED ORDER — ONDANSETRON 4 MG PO TBDP
ORAL_TABLET | ORAL | Status: AC
  Filled 2017-05-09: qty 1

## 2017-05-09 MED ORDER — ONDANSETRON HCL 4 MG PO TABS: 4.0000 mg | ORAL_TABLET | Freq: Four times a day (QID) | ORAL | 0 refills | Status: DC

## 2017-05-09 NOTE — ED Triage Notes (Signed)
Pt having nausea for 1 week and said she thought it may have been something she ate so she tried to wait it out but said it's not getting any better. Having the nausea feeling that wakes her out of her sleep but hasn't vomited. Also having a decrease in appetite has been eating soup. No otc meds tried, just drinking ginger ale. Said she did have her period this month.

## 2017-05-09 NOTE — ED Provider Notes (Signed)
CSN: [id number]     Arrival date & time 05/09/17  1001 History   None    Chief Complaint  Patient presents with  . Nausea   (Consider location/radiation/quality/duration/timing/severity/associated sxs/prior Treatment) 31 yr old AA female presents to UC with cc of nausea intermittently x 1 week, LMP 03/18/17, denies abd pian, diarrhea,. Vomiting or vag discharge. Afebrile.    The history is provided by the patient. No language interpreter was used.    Past Medical History:  Diagnosis Date  . Pregnancy induced hypertension    Past Surgical History:  Procedure Laterality Date  . DILATION AND EVACUATION N/A 02/21/2013   Procedure: DILATATION AND EVACUATION;  Surgeon: Reva Boresanya S Pratt, MD;  Location: [facility];  Service: Gynecology;  Laterality: N/A;   Family History  Problem Relation Age of Onset  . Hypertension Mother    Social History  Substance Use Topics  . Smoking status: Current Every Day Smoker    Packs/day: 0.25    Types: Cigarettes  . Smokeless tobacco: Former NeurosurgeonUser    Quit date: 01/08/2015  . Alcohol use 0.0 oz/week     Comment: occ   OB History    Gravida Para Term Preterm AB Living   5 4 3 1   4    SAB TAB Ectopic Multiple Live Births                 Review of Systems  Constitutional: Negative for fever.  Eyes: Negative.   Respiratory: Negative.   Cardiovascular: Negative.   Gastrointestinal: Positive for nausea. Negative for abdominal pain.  Genitourinary: Negative for dysuria and vaginal discharge.  Musculoskeletal: Negative.   Skin: Negative.   Allergic/Immunologic: Negative.   Psychiatric/Behavioral: Negative.   All other systems reviewed and are negative.   Allergies  Flagyl [metronidazole]  Home Medications   Prior to Admission medications   Medication Sig Start Date End Date Taking? Authorizing Provider  amLODipine (NORVASC) 2.5 MG tablet Take 2.5 mg by mouth daily.   Yes [provider]  hydrochlorothiazide (HYDRODIURIL) 50 MG tablet  Take 50 mg by mouth daily.   Yes [provider]  clindamycin (CLEOCIN) 2 % vaginal cream Place 1 Applicatorful vaginally at bedtime. 03/12/17   Deatra Canterxford, William J, FNP  clindamycin (CLEOCIN) 300 MG capsule Take one po bid x 7 days 03/12/17   Deatra Canterxford, William J, FNP  HYDROcodone-acetaminophen (NORCO/VICODIN) 5-325 MG tablet Take 1 tablet by mouth every 6 (six) hours as needed. 04/25/17   Roxy HorsemanBrowning, Robert, PA-C  ibuprofen (ADVIL,MOTRIN) 800 MG tablet Take 1 tablet (800 mg total) by mouth 3 (three) times daily. 04/25/17   Roxy HorsemanBrowning, Robert, PA-C  ondansetron (ZOFRAN) 4 MG tablet Take 1 tablet (4 mg total) by mouth every 6 (six) hours. 05/09/17   Jaimie Redditt, Para MarchJeanette, NP   Meds Ordered and Administered this Visit   Medications  ondansetron (ZOFRAN-ODT) disintegrating tablet 4 mg (4 mg Oral Given 05/09/17 1119)    BP (!) 142/94 (BP Location: Left Arm) Comment: notified rn  Pulse 79   Temp 98.8 F (37.1 C) (Oral)   Resp 14   LMP 04/17/2017 (Exact Date)   SpO2 100%  No data found.   Physical Exam  Constitutional: She appears well-developed and well-nourished. She is active and cooperative.  HENT:  Head: Normocephalic.  Right Ear: Tympanic membrane normal.  Left Ear: Tympanic membrane normal.  Nose: Nose normal.  Mouth/Throat: Uvula is midline, oropharynx is clear and moist and mucous membranes are normal.  Neck: Trachea normal.  Cardiovascular:  Normal rate, regular rhythm, normal heart sounds and normal pulses.   Pulmonary/Chest: Effort normal and breath sounds normal.  Abdominal: Normal appearance and bowel sounds are normal. There is no tenderness.  Neurological: She is alert. No cranial nerve deficit or sensory deficit. GCS eye subscore is 4. GCS verbal subscore is 5. GCS motor subscore is 6.  Psychiatric: She has a normal mood and affect. Her speech is normal and behavior is normal.  Nursing note and vitals reviewed.   Urgent Care Course     Procedures (including critical care  time)  Labs Review Labs Reviewed  POCT PREGNANCY, URINE    Imaging Review No results found.        MDM   1. Nausea   2. Cigarette smoker   3. Elevated blood pressure reading   4. Hx of essential hypertension     Zofran 4 mg po in office for nausea. Rest,push fluids, bland diet. Take zofran as directed. Follow up with PCP for recheck. Avoid smoking, cigarettes, and alcohol as this can make nausea worse. Your preg test was negative. Pt verbalized understanding to this provider.    Clancy Gourd, NP 05/09/17 [phone]

## 2017-05-09 NOTE — Discharge Instructions (Signed)
Rest,push fluids, bland diet. Take zofran as directed. Follow up with PCP for recheck. Avoid smoking, cigarettes, and alcohol as this can make nausea worse. Your preg test was negative.

## 2017-05-19 ENCOUNTER — Ambulatory Visit (HOSPITAL_COMMUNITY)
Admission: EM | Admit: 2017-05-19 | Discharge: 2017-05-19 | Disposition: A | Discharge status: Home / Self Care | Payer: Self-pay | Attending: Internal Medicine | Admitting: Internal Medicine

## 2017-05-19 ENCOUNTER — Encounter (HOSPITAL_COMMUNITY): Payer: Self-pay | Admitting: Emergency Medicine

## 2017-05-19 DIAGNOSIS — N760 Acute vaginitis: Secondary | ICD-10-CM

## 2017-05-19 DIAGNOSIS — B9689 Other specified bacterial agents as the cause of diseases classified elsewhere: Secondary | ICD-10-CM

## 2017-05-19 DIAGNOSIS — N898 Other specified noninflammatory disorders of vagina: Secondary | ICD-10-CM

## 2017-05-19 HISTORY — DX: Acute vaginitis: B96.89

## 2017-05-19 HISTORY — DX: Acute vaginitis: N76.0

## 2017-05-19 MED ORDER — CLINDAMYCIN HCL 300 MG PO CAPS: 300.0000 mg | ORAL_CAPSULE | Freq: Two times a day (BID) | ORAL | 0 refills | Status: DC

## 2017-05-19 NOTE — ED Provider Notes (Signed)
CSN: [id number]     Arrival date & time 05/19/17  4098 History   First MD Initiated Contact with Patient 05/19/17 1019     Chief Complaint  Patient presents with  . Vaginitis   (Consider location/radiation/quality/duration/timing/severity/associated sxs/prior Treatment) 31 year old female with a history of several episodes of BV presents today with vaginal odor but without discharge or pelvic pain. She states this occurs about once a month and she received clindamycin for treatment. She is allergic to Flagyl. She has no other complaints. LMP 04/27/2017. Right on time, no missed periods. She declines having any type of testing because she is certain that it is BV because it always is.      Past Medical History:  Diagnosis Date  . Bacterial vaginosis   . Pregnancy induced hypertension    Past Surgical History:  Procedure Laterality Date  . DILATION AND EVACUATION N/A 02/21/2013   Procedure: DILATATION AND EVACUATION;  Surgeon: Reva Bores, MD;  Location: [facility];  Service: Gynecology;  Laterality: N/A;   Family History  Problem Relation Age of Onset  . Hypertension Mother    Social History  Substance Use Topics  . Smoking status: Current Every Day Smoker    Packs/day: 0.25    Types: Cigarettes  . Smokeless tobacco: Former Neurosurgeon    Quit date: 01/08/2015  . Alcohol use 0.0 oz/week     Comment: occ   OB History    Gravida Para Term Preterm AB Living   5 4 3 1   4    SAB TAB Ectopic Multiple Live Births                 Review of Systems  Constitutional: Negative.   Genitourinary: Negative for dysuria, flank pain, genital sores, pelvic pain, urgency, vaginal bleeding and vaginal discharge.       As per history of present illness  Neurological: Negative.   Psychiatric/Behavioral: Negative.   All other systems reviewed and are negative.   Allergies  Flagyl [metronidazole]  Home Medications   Prior to Admission medications   Medication Sig Start Date End Date Taking?  Authorizing Provider  amLODipine (NORVASC) 2.5 MG tablet Take 2.5 mg by mouth daily.   Yes [provider]  hydrochlorothiazide (HYDRODIURIL) 50 MG tablet Take 50 mg by mouth daily.   Yes [provider]  clindamycin (CLEOCIN) 300 MG capsule Take 1 capsule (300 mg total) by mouth 2 (two) times daily. 05/19/17   Hayden Rasmussen, NP   Meds Ordered and Administered this Visit  Medications - No data to display  BP (!) 147/98 (BP Location: Right Arm)   Pulse 80   Temp 98.3 F (36.8 C) (Oral)   Resp 18   LMP 04/29/2017   SpO2 95%  No data found.   Physical Exam  Constitutional: She is oriented to person, place, and time. She appears well-developed and well-nourished. No distress.  Eyes: EOM are normal.  Neck: Neck supple.  Cardiovascular: Normal rate.   Pulmonary/Chest: Effort normal. No respiratory distress.  Musculoskeletal: She exhibits no edema.  Neurological: She is alert and oriented to person, place, and time. She exhibits normal muscle tone.  Skin: Skin is warm and dry.  Psychiatric: She has a normal mood and affect.  Nursing note and vitals reviewed.   Urgent Care Course     Procedures (including critical care time)  Labs Review Labs Reviewed - No data to display  Imaging Review No results found.   Visual Acuity Review  Right  Eye Distance:   Left Eye Distance:   Bilateral Distance:    Right Eye Near:   Left Eye Near:    Bilateral Near:         MDM   1. Vaginal odor   2. BV (bacterial vaginosis)    take the clindamycin as directed. Read the instructions, regarding bacterial vaginosis. Follow-up with gynecologist and recommend that you obtain a primary care provider as soon as possible. Meds ordered this encounter  Medications  . clindamycin (CLEOCIN) 300 MG capsule    Sig: Take 1 capsule (300 mg total) by mouth 2 (two) times daily.    Dispense:  14 capsule    Refill:  0    Order Specific Question:   Supervising Provider    Answer:    Eustace MooreMURRAY, LAURA W [[id number]]       Hayden RasmussenMabe, Arturo Freundlich, NP 05/19/17 1117

## 2017-05-19 NOTE — ED Triage Notes (Signed)
[facility] a [facility] odor that started yesterday. The patient reported a hx of BV.

## 2017-05-19 NOTE — Discharge Instructions (Signed)
take the clindamycin as directed. Read the instructions, regarding bacterial vaginosis. Follow-up with gynecologist and recommend that you obtain a primary care provider as soon as possible.

## 2017-05-19 NOTE — ED Notes (Signed)
Updated patient at nurses desk, notified Hayden Rasmussendavid mabe, np

## 2017-08-04 ENCOUNTER — Ambulatory Visit (HOSPITAL_COMMUNITY)
Admission: EM | Admit: 2017-08-04 | Discharge: 2017-08-04 | Disposition: A | Discharge status: Home / Self Care | Payer: Self-pay | Attending: Family Medicine | Admitting: Family Medicine

## 2017-08-04 ENCOUNTER — Ambulatory Visit (INDEPENDENT_AMBULATORY_CARE_PROVIDER_SITE_OTHER): Payer: Self-pay

## 2017-08-04 ENCOUNTER — Encounter (HOSPITAL_COMMUNITY): Payer: Self-pay | Admitting: Emergency Medicine

## 2017-08-04 DIAGNOSIS — S161XXA Strain of muscle, fascia and tendon at neck level, initial encounter: Secondary | ICD-10-CM

## 2017-08-04 DIAGNOSIS — S4992XA Unspecified injury of left shoulder and upper arm, initial encounter: Secondary | ICD-10-CM

## 2017-08-04 MED ORDER — IBUPROFEN 800 MG PO TABS
800.0000 mg | ORAL_TABLET | Freq: Once | ORAL | Status: AC
  Administered 2017-08-04: 800 mg via ORAL

## 2017-08-04 MED ORDER — IBUPROFEN 800 MG PO TABS
ORAL_TABLET | ORAL | Status: AC
  Filled 2017-08-04: qty 1

## 2017-08-04 NOTE — ED Provider Notes (Signed)
[facility]   [id number] 08/04/17 Arrival Time: 1204  ASSESSMENT & PLAN:  1. Shoulder injury, left, initial encounter   2. Cervical strain, acute, initial encounter     Meds ordered this encounter  Medications  . ibuprofen (ADVIL,MOTRIN) tablet 800 mg   No acute fractures on x-rays today. Rec OTC Aleve BID with food for the next few days. RTC if not improving.  Reviewed expectations re: course of current medical issues. Questions answered. Outlined signs and symptoms indicating need for more acute intervention. Patient verbalized understanding. After Visit Summary given.   SUBJECTIVE:  Angela Manning is a 31 y.o. female who presents with complaint of left shoulder and neck discomfort after a fall three days ago. H/O left shoulder dislocation in the past. Reports no ROM loss of L shoulder today. "Just sore." Neck discomfort persisting. Worse when turning head. No extremity weakness or tingling reported. Tylenol last evening with mild help. Ambulatory without problems.  ROS: As per HPI.   OBJECTIVE:  Vitals:   08/04/17 1321 08/04/17 1323  BP:  (!) 156/103  Pulse:  81  Resp:  16  Temp:  98.3 F (36.8 C)  TempSrc:  Oral  SpO2:  98%  Weight: 133 lb (60.3 kg)   Height: 5\' 7"  (1.702 m)      General appearance: alert; no distress Eyes: PERRLA; EOMI; conjunctiva normal HENT: normocephalic; atraumatic Neck: supple with FROM but she moves neck slowly when asked; does report midline tenderness Lungs: clear to auscultation bilaterally Heart: regular rate and rhythm Extremities: LUE with FROM; poorly localized tenderness over anterior shoulder; no swelling; no gross abnormality Skin: warm and dry Neurologic: normal gait; normal symmetric reflexes Psychological: alert and cooperative; normal mood and affect  Past Medical History:  Diagnosis Date  . Bacterial vaginosis   . Pregnancy induced hypertension     Imaging: Dg Cervical Spine Complete  Result  Date: 08/04/2017 CLINICAL DATA:  Left neck pain after fall. EXAM: CERVICAL SPINE - COMPLETE 4+ VIEW COMPARISON:  None. FINDINGS: There is no evidence of cervical spine fracture or prevertebral soft tissue swelling. Alignment is normal. No other significant bone abnormalities are identified. IMPRESSION: Negative cervical spine radiographs. Note: Cervical spine radiography has a known limited sensitivity to the detection of acute fractures in patients with significant cervical spine trauma. If imaging is indicated using NEXUS or CCR clinical criteria for cervical spine injury then CT of the cervical spine is recommended as the study of choice for primary evaluation. Electronically Signed   By: Obie Dredge M.D.   On: 08/04/2017 13:55   Dg Shoulder Left  Result Date: 08/04/2017 CLINICAL DATA:  Status post fall down port steps 3 days ago with persistent left shoulder and left-sided neck pain. History of previous dislocation of the left shoulder. EXAM: LEFT SHOULDER - 2+ VIEW COMPARISON:  Left shoulder series of Apr 25, 2017 FINDINGS: The bones are subjectively adequately mineralized. A bony density adjacent to the inferior medial aspect of the articular margin of the humeral head is again demonstrated. Subtle lucency through the inferior articular margin of the glenoid is again observed. No acute fracture or dislocation is demonstrated. As best as can be determined the [facility] joint is intact. No acute left clavicular fracture is observed. The observed portions of the upper left ribs are normal. IMPRESSION: No definite acute abnormality of the left shoulder. Pre-existing avulsion fractures are present involving the inferior lip of the glenoid and the inferior medial aspect of the left humeral head. Electronically  Signed   By: David  [place] M.D.   On: 08/04/2017 13:56    Allergies  Allergen Reactions  . Flagyl [Metronidazole] Rash    Family History  Problem Relation Age of Onset  . Hypertension Mother     Past Surgical History:  Procedure Laterality Date  . DILATION AND EVACUATION N/A 02/21/2013   Procedure: DILATATION AND EVACUATION;  Surgeon: Reva Bores, MD;  Location: [facility];  Service: Gynecology;  Laterality: N/AMardella Layman, MD 08/04/17 [phone]

## 2017-08-04 NOTE — ED Triage Notes (Signed)
PT reports she fell from a four step height onto concrete. PT fell onto left side. PT reports pain in left shoulder and neck. Fall occurred two days ago.

## 2017-08-04 NOTE — Discharge Instructions (Signed)
You may use over the counter ibuprofen or acetaminophen as needed.  ° °

## 2017-08-26 ENCOUNTER — Ambulatory Visit (HOSPITAL_COMMUNITY)
Admission: EM | Admit: 2017-08-26 | Discharge: 2017-08-26 | Disposition: A | Discharge status: Home / Self Care | Payer: Self-pay | Attending: Family Medicine | Admitting: Family Medicine

## 2017-08-26 ENCOUNTER — Encounter (HOSPITAL_COMMUNITY): Payer: Self-pay | Admitting: *Deleted

## 2017-08-26 DIAGNOSIS — T24202A Burn of second degree of unspecified site of left lower limb, except ankle and foot, initial encounter: Secondary | ICD-10-CM

## 2017-08-26 MED ORDER — HYDROCODONE-ACETAMINOPHEN 5-325 MG PO TABS
1.0000 | ORAL_TABLET | Freq: Four times a day (QID) | ORAL | 0 refills | Status: DC | PRN
Start: 2017-08-26 — End: 2018-01-31

## 2017-08-26 MED ORDER — SILVER SULFADIAZINE 1 % EX CREA: 1.0000 "application " | TOPICAL_CREAM | Freq: Every day | CUTANEOUS | 0 refills | Status: DC
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
## 2017-10-05 ENCOUNTER — Ambulatory Visit (HOSPITAL_COMMUNITY)
Admission: EM | Admit: 2017-10-05 | Discharge: 2017-10-05 | Disposition: A | Discharge status: Home / Self Care | Payer: Medicaid Other | Attending: Physician Assistant | Admitting: Physician Assistant

## 2017-10-05 ENCOUNTER — Encounter (HOSPITAL_COMMUNITY): Payer: Self-pay | Admitting: *Deleted

## 2017-10-05 DIAGNOSIS — Z23 Encounter for immunization: Secondary | ICD-10-CM | POA: Diagnosis not present

## 2017-10-05 DIAGNOSIS — S91311A Laceration without foreign body, right foot, initial encounter: Secondary | ICD-10-CM

## 2017-10-05 MED ORDER — BACITRACIN ZINC 500 UNIT/GM EX OINT
TOPICAL_OINTMENT | CUTANEOUS | Status: AC
  Filled 2017-10-05: qty 4.5

## 2017-10-05 MED ORDER — TETANUS-DIPHTH-ACELL PERTUSSIS 5-2.5-18.5 LF-MCG/0.5 IM SUSP
INTRAMUSCULAR | Status: AC
  Filled 2017-10-05: qty 0.5

## 2017-10-05 MED ORDER — TETANUS-DIPHTH-ACELL PERTUSSIS 5-2.5-18.5 LF-MCG/0.5 IM SUSP
0.5000 mL | Freq: Once | INTRAMUSCULAR | Status: AC
  Administered 2017-10-05: 0.5 mL via INTRAMUSCULAR
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
## 2017-10-05 NOTE — Discharge Instructions (Signed)
Use gentle soap and water to clean the wound. Okay to start scrubbing the wound in about 10 days to aid in sutures dissolving.  Come back here if you are having any problems with the wound.

## 2017-10-05 NOTE — ED Provider Notes (Signed)
10/05/2017 2:41 PM   DOB: 05/26/1986 / MRN: [id number]  SUBJECTIVE:  Angela Manning is a 31 y.o. female presenting for a cut on the inside right foot at the medial aspect of the MTP.  Came straight here.  Nursing has given tdap.   She is allergic to flagyl [metronidazole].   She  has a past medical history of Bacterial vaginosis and Pregnancy induced hypertension.    She  reports that she has been smoking Cigarettes.  She has been smoking about 0.25 packs per day. She quit smokeless tobacco use about 2 years ago. She reports that she drinks alcohol. She reports that she does not use drugs. She  reports that she currently engages in sexual activity and has had female partners. She reports using the following method of birth control/protection: None. The patient  has a past surgical history that includes Dilation and evacuation (N/A, 02/21/2013).  Her family history includes Hypertension in her mother.  Review of Systems  Constitutional: Negative for chills, diaphoresis and fever.  Respiratory: Negative for shortness of breath.   Cardiovascular: Negative for chest pain, orthopnea and leg swelling.  Gastrointestinal: Negative for nausea.  Skin: Negative for rash.  Neurological: Negative for dizziness.  Endo/Heme/Allergies: Does not bruise/bleed easily.    OBJECTIVE:  BP (!) 141/107 Comment: Has not taken HTN med today  Pulse 92   Temp 98.5 F (36.9 C) (Oral)   Resp 16   LMP 09/17/2017 (Exact Date)   SpO2 100%   Physical Exam  Constitutional: She is active.  Non-toxic appearance.  Cardiovascular: Normal rate.   Pulmonary/Chest: Effort normal. No tachypnea.  Musculoskeletal:       Feet:  Neurological: She is alert.  Skin: Skin is warm and dry. She is not diaphoretic. No pallor.   Risk and benefits discussed and verbal consent obtained. Anesthetic allergies reviewed. Patient anesthetized using 1:1 mix of 2% lidocaine with epi and Marcaine. The wound was cleansed thoroughly with soap  and water. Sterile prep and drape. Wound closed with 6 throws using 4-0 Vicryl suture material. Hemostasis achieved. Mupirocin applied to the wound and bandage placed. The patient tolerated well. Wound instructions were provided.   No results found for this or any previous visit (from the past 72 hour(s)).  No results found.  ASSESSMENT AND PLAN:  The encounter diagnosis was Laceration of right foot, initial encounter. Repaired with good approximation.  RTC as needed.     The patient is advised to call or return to clinic if she does not see an improvement in symptoms, or to seek the care of the closest emergency department if she worsens with the above plan.   Deliah BostonMichael Tu Shimmel, MHS, PA-C 10/05/2017 2:41 PM    Ofilia Neaslark, Kortney Schoenfelder L, PA-C 10/05/17 1441

## 2017-10-26 ENCOUNTER — Ambulatory Visit (HOSPITAL_COMMUNITY)
Admission: EM | Admit: 2017-10-26 | Discharge: 2017-10-26 | Disposition: A | Discharge status: Home / Self Care | Payer: Medicaid Other | Attending: Family Medicine | Admitting: Family Medicine

## 2017-10-26 ENCOUNTER — Encounter (HOSPITAL_COMMUNITY): Payer: Self-pay | Admitting: *Deleted

## 2017-10-26 DIAGNOSIS — Z4802 Encounter for removal of sutures: Secondary | ICD-10-CM

## 2017-10-26 DIAGNOSIS — S91311D Laceration without foreign body, right foot, subsequent encounter: Secondary | ICD-10-CM | POA: Diagnosis not present

## 2017-10-26 DIAGNOSIS — L089 Local infection of the skin and subcutaneous tissue, unspecified: Secondary | ICD-10-CM

## 2017-10-26 DIAGNOSIS — T148XXA Other injury of unspecified body region, initial encounter: Secondary | ICD-10-CM

## 2017-10-26 MED ORDER — TRAMADOL HCL 50 MG PO TABS: 50.0000 mg | ORAL_TABLET | Freq: Four times a day (QID) | ORAL | 0 refills | Status: DC | PRN

## 2017-10-26 MED ORDER — IBUPROFEN 600 MG PO TABS: 600.0000 mg | ORAL_TABLET | Freq: Four times a day (QID) | ORAL | 0 refills | Status: DC | PRN

## 2017-10-26 MED ORDER — CEPHALEXIN 500 MG PO CAPS
500.0000 mg | ORAL_CAPSULE | Freq: Four times a day (QID) | ORAL | 0 refills | Status: DC
Start: 2017-10-26 — End: 2018-01-31

## 2017-10-26 NOTE — ED Provider Notes (Signed)
[facility]    CSN: [id number] Arrival date & time: 10/26/17  1550     History   Chief Complaint Chief Complaint  Patient presents with  . Suture / Staple Removal  . Wound Check    HPI Angela Manning is a 31 y.o. female.   31 year old female presents to the urgent care for suture removal of the wound repair of the left foot over the head of the metatarsal. This occurred about 3 weeks ago. It was closed with Vicryl. She states she has developed pain as well as surrounding puffiness redness and exhibiting purulence.      Past Medical History:  Diagnosis Date  . Bacterial vaginosis   . Pregnancy induced hypertension     Patient Active Problem List   Diagnosis Date Noted  . Nausea 05/09/2017  . Elevated blood pressure reading 05/09/2017  . Elevated serum hCG 03/12/2016  . Ovarian cyst, left 03/01/2015  . Abnormal uterine bleeding (AUB) 03/01/2015  . Dyspareunia 03/01/2015  . Smoker 10/23/2014  . Retained products of conception with hemorrhage 02/21/2013    Past Surgical History:  Procedure Laterality Date  . DILATATION AND EVACUATION N/A 02/21/2013   Performed by Reva BoresPratt, Tanya S, [facility]    OB History    Gravida Para Term Preterm AB Living   5 4 3 1   4    SAB TAB Ectopic Multiple Live Births                   Home Medications    Prior to Admission medications   Medication Sig Start Date End Date Taking? Authorizing Provider  amLODipine (NORVASC) 2.5 MG tablet Take 2.5 mg by mouth daily.   Yes [provider]  hydrochlorothiazide (HYDRODIURIL) 50 MG tablet Take 50 mg by mouth daily.   Yes [provider]  cephALEXin (KEFLEX) 500 MG capsule Take 1 capsule (500 mg total) 4 (four) times daily by mouth. 10/26/17   Hayden RasmussenMabe, Bernedette Auston, NP  HYDROcodone-acetaminophen (NORCO/VICODIN) 5-325 MG tablet Take 1 tablet by mouth every 6 (six) hours as needed for moderate pain or severe pain. 08/26/17   Mardella LaymanHagler, Brian, MD  ibuprofen (ADVIL,MOTRIN)  600 MG tablet Take 1 tablet (600 mg total) every 6 (six) hours as needed by mouth. 10/26/17   Hayden RasmussenMabe, Dwane Andres, NP  silver sulfADIAZINE (SILVADENE) 1 % cream Apply 1 application topically daily. 08/26/17   Mardella LaymanHagler, Brian, MD  traMADol (ULTRAM) 50 MG tablet Take 1 tablet (50 mg total) every 6 (six) hours as needed by mouth. 10/26/17   Hayden RasmussenMabe, Samhitha Rosen, NP    Family History Family History  Problem Relation Age of Onset  . Hypertension Mother     Social History Social History   Tobacco Use  . Smoking status: Former Smoker    Packs/day: 0.25    Types: Cigarettes  . Smokeless tobacco: Former NeurosurgeonUser    Quit date: 01/08/2015  Substance Use Topics  . Alcohol use: Yes    Alcohol/week: 0.0 oz    Comment: occasionally  . Drug use: No     Allergies   Flagyl [metronidazole]   Review of Systems Review of Systems  Constitutional: Negative.   Skin: Positive for wound.  All other systems reviewed and are negative.    Physical Exam Triage Vital Signs ED Triage Vitals  Enc Vitals Group     BP 10/26/17 1614 (!) 149/106     Pulse Rate 10/26/17 1614 74     Resp 10/26/17 1614 16  Temp 10/26/17 1614 97.7 F (36.5 C)     Temp Source 10/26/17 1614 Oral     SpO2 10/26/17 1614 100 %     Weight --      Height --      Head Circumference --      Peak Flow --      Pain Score 10/26/17 1615 6     Pain Loc --      Pain Edu? --      Excl. in GC? --    No data found.  Updated Vital Signs BP (!) 149/106 Comment: has not taken HTN med today  Pulse 74   Temp 97.7 F (36.5 C) (Oral)   Resp 16   LMP 10/15/2017 (Exact Date)   SpO2 100%   Visual Acuity Right Eye Distance:   Left Eye Distance:   Bilateral Distance:    Right Eye Near:   Left Eye Near:    Bilateral Near:     Physical Exam  Constitutional: She appears well-developed and well-nourished.  Musculoskeletal: Normal range of motion. She exhibits tenderness. She exhibits no edema.  Skin: Skin is warm and dry.  Wound is healing and  edges are well approximated. Some surrounding puffiness mild redness and small amount of pus can be expressed.  Nursing note and vitals reviewed.    UC Treatments / Results  Labs (all labs ordered are listed, but only abnormal results are displayed) Labs Reviewed - No data to display  EKG  EKG Interpretation None     Procedure:. The Vicryl that is visible was cut and removed.  Radiology No results found.  Procedures Procedures (including critical care time)  Medications Ordered in UC Medications - No data to display   Initial Impression / Assessment and Plan / UC Course  I have reviewed the triage vital signs and the nursing notes.  Pertinent labs & imaging results that were available during my care of the patient were reviewed by me and considered in my medical decision making (see chart for details).    Warm salt water soaks 2-3 times a day or apply warm moist compresses. At other times keep the wound dry. Take anabolic as directed. Use cushions over the wound to keep pressure off as discussed.     Final Clinical Impressions(s) / UC Diagnoses   Final diagnoses:  Visit for suture removal  Wound infection    ED Discharge Orders        Ordered    cephALEXin (KEFLEX) 500 MG capsule  4 times daily     10/26/17 1640    ibuprofen (ADVIL,MOTRIN) 600 MG tablet  Every 6 hours PRN     10/26/17 1643    traMADol (ULTRAM) 50 MG tablet  Every 6 hours PRN     10/26/17 1643       Controlled Substance Prescriptions [place] Controlled Substance Registry consulted? Not Applicable   Hayden RasmussenMabe, Alaijah Gibler, NP 10/26/17 1649

## 2017-10-26 NOTE — ED Triage Notes (Signed)
Pt reports getting sutures in right foot 3 wks ago - was told to have them removed at 10 days.  States today has purulent drainage coming from site.  Denies fevers.

## 2017-10-26 NOTE — Discharge Instructions (Signed)
Warm salt water soaks 2-3 times a day or apply warm moist compresses. At other times keep the wound dry. Take anabolic as directed. Use cushions over the wound to keep pressure off as discussed.

## 2017-12-22 ENCOUNTER — Other Ambulatory Visit: Payer: Self-pay

## 2017-12-22 ENCOUNTER — Ambulatory Visit (INDEPENDENT_AMBULATORY_CARE_PROVIDER_SITE_OTHER): Payer: Medicaid Other | Admitting: Advanced Practice Midwife

## 2017-12-22 ENCOUNTER — Emergency Department (HOSPITAL_COMMUNITY): Payer: Medicaid Other

## 2017-12-22 ENCOUNTER — Encounter (HOSPITAL_COMMUNITY): Payer: Self-pay | Admitting: Emergency Medicine

## 2017-12-22 ENCOUNTER — Encounter: Payer: Self-pay | Admitting: Advanced Practice Midwife

## 2017-12-22 ENCOUNTER — Emergency Department (HOSPITAL_COMMUNITY)
Admission: EM | Admit: 2017-12-22 | Discharge: 2017-12-22 | Disposition: A | Discharge status: Home / Self Care | Payer: Medicaid Other | Attending: Emergency Medicine | Admitting: Emergency Medicine

## 2017-12-22 DIAGNOSIS — Z87891 Personal history of nicotine dependence: Secondary | ICD-10-CM | POA: Diagnosis not present

## 2017-12-22 DIAGNOSIS — I10 Essential (primary) hypertension: Secondary | ICD-10-CM

## 2017-12-22 DIAGNOSIS — B9689 Other specified bacterial agents as the cause of diseases classified elsewhere: Secondary | ICD-10-CM | POA: Insufficient documentation

## 2017-12-22 DIAGNOSIS — R1032 Left lower quadrant pain: Secondary | ICD-10-CM | POA: Diagnosis present

## 2017-12-22 DIAGNOSIS — R110 Nausea: Secondary | ICD-10-CM | POA: Insufficient documentation

## 2017-12-22 DIAGNOSIS — Z79899 Other long term (current) drug therapy: Secondary | ICD-10-CM | POA: Diagnosis not present

## 2017-12-22 DIAGNOSIS — R102 Pelvic and perineal pain: Secondary | ICD-10-CM

## 2017-12-22 DIAGNOSIS — N83202 Unspecified ovarian cyst, left side: Secondary | ICD-10-CM | POA: Diagnosis not present

## 2017-12-22 DIAGNOSIS — N760 Acute vaginitis: Secondary | ICD-10-CM | POA: Diagnosis not present

## 2017-12-22 LAB — GC/CHLAMYDIA PROBE AMP (~~LOC~~) NOT AT ARMC
Chlamydia: NEGATIVE
Neisseria Gonorrhea: NEGATIVE

## 2017-12-22 LAB — I-STAT BETA HCG BLOOD, ED (MC, WL, AP ONLY): I-stat hCG, quantitative: <5 m[IU]/mL (ref <5)

## 2017-12-22 LAB — COMPREHENSIVE METABOLIC PANEL
ALT: 12 U/L — ABNORMAL LOW (ref 14–54)
AST: 20 U/L (ref 15–41)
Albumin: 4.1 g/dL (ref 3.5–5.0)
Alkaline Phosphatase: 48 U/L (ref 38–126)
Anion gap: 10 (ref 5–15)
BUN: 11 mg/dL (ref 6–20)
CO2: 22 mmol/L (ref 22–32)
Calcium: 9 mg/dL (ref 8.9–10.3)
Chloride: 103 mmol/L (ref 101–111)
Creatinine, Ser: 0.76 mg/dL (ref 0.44–1.00)
GFR calc Af Amer: >60 mL/min (ref >60)
GFR calc non Af Amer: >60 mL/min (ref >60)
Glucose, Bld: 86 mg/dL (ref 65–99)
Potassium: 3.4 mmol/L — ABNORMAL LOW (ref 3.5–5.1)
Sodium: 135 mmol/L (ref 135–145)
Total Bilirubin: 0.6 mg/dL (ref 0.3–1.2)
Total Protein: 6.9 g/dL (ref 6.5–8.1)

## 2017-12-22 LAB — URINALYSIS, ROUTINE W REFLEX MICROSCOPIC
Bilirubin Urine: NEGATIVE
Glucose, UA: NEGATIVE mg/dL
Hgb urine dipstick: NEGATIVE
Ketones, ur: 20 mg/dL — AB
Leukocytes, UA: NEGATIVE
Nitrite: NEGATIVE
Protein, ur: NEGATIVE mg/dL
Specific Gravity, Urine: 1.029 (ref 1.005–1.030)
pH: 5 (ref 5.0–8.0)

## 2017-12-22 LAB — LIPASE, BLOOD: Lipase: 29 U/L (ref 11–51)

## 2017-12-22 LAB — WET PREP, GENITAL
Trich, Wet Prep: NONE SEEN
Yeast Wet Prep HPF POC: NONE SEEN

## 2017-12-22 LAB — CBC
HCT: 42.8 % (ref 36.0–46.0)
Hemoglobin: 14 g/dL (ref 12.0–15.0)
MCH: 27.4 pg (ref 26.0–34.0)
MCHC: 32.7 g/dL (ref 30.0–36.0)
MCV: 83.8 fL (ref 78.0–100.0)
Platelets: 247 10*3/uL (ref 150–400)
RBC: 5.11 MIL/uL (ref 3.87–5.11)
RDW: 13.9 % (ref 11.5–15.5)
WBC: 5 10*3/uL (ref 4.0–10.5)

## 2017-12-22 MED ORDER — ONDANSETRON 4 MG PO TBDP
4.0000 mg | ORAL_TABLET | Freq: Once | ORAL | Status: AC
  Administered 2017-12-22: 4 mg via ORAL
  Filled 2017-12-22: qty 1

## 2017-12-22 MED ORDER — AMLODIPINE BESYLATE 2.5 MG PO TABS: 2.5000 mg | ORAL_TABLET | Freq: Every day | ORAL | 0 refills | Status: DC

## 2017-12-22 MED ORDER — HYDROCODONE-ACETAMINOPHEN 5-325 MG PO TABS
1.0000 | ORAL_TABLET | Freq: Once | ORAL | Status: AC
  Administered 2017-12-22: 1 via ORAL
  Filled 2017-12-22: qty 1

## 2017-12-22 MED ORDER — NAPROXEN 500 MG PO TABS: 500.0000 mg | ORAL_TABLET | Freq: Two times a day (BID) | ORAL | 0 refills | Status: DC

## 2017-12-22 MED ORDER — ONDANSETRON 4 MG PO TBDP
4.0000 mg | ORAL_TABLET | Freq: Once | ORAL | Status: AC | PRN
  Administered 2017-12-22: 4 mg via ORAL
  Filled 2017-12-22: qty 1

## 2017-12-22 MED ORDER — HYDROCHLOROTHIAZIDE 50 MG PO TABS: 50.0000 mg | ORAL_TABLET | Freq: Every day | ORAL | 0 refills | Status: DC

## 2017-12-22 MED ORDER — CLINDAMYCIN HCL 300 MG PO CAPS: 300.0000 mg | ORAL_CAPSULE | Freq: Two times a day (BID) | ORAL | 0 refills | Status: DC

## 2017-12-22 NOTE — Discharge Instructions (Signed)
Take the medications as prescribed and follow-up with the gynecologist.  Return to the ED if develop new or worsening symptoms.

## 2017-12-22 NOTE — ED Notes (Signed)
Patient transported to Ultrasound 

## 2017-12-22 NOTE — ED Provider Notes (Signed)
[facility] Provider Note   CSN: [id number] Arrival date & time: 12/22/17  0039     History   Chief Complaint Chief Complaint  Patient presents with  . Abdominal Pain    HPI Angela Manning is a 32 y.o. female.  Patient with a history of ovarian cyst and hypertension presenting with pain across her lower abdomen worse over the past 3 days.  She describes pain across her pelvis and worse in the left side greater than right.  Associated with nausea and anorexia but no vomiting.  States she has not wanted to eat for the past several days.  Normal bowel movements.  No pain with urination or blood in the urine.  No vaginal bleeding or discharge.  Last menstrual cycle January 5.  She states she has had ovarian cyst in the past and this feels similar.  She has not had her blood pressure medication for about a week.  She denies any chest pain, shortness of breath, headache or vision change.   The history is provided by the patient.  Abdominal Pain   Associated symptoms include nausea. Pertinent negatives include fever, vomiting, dysuria, hematuria, headaches, arthralgias and myalgias.    Past Medical History:  Diagnosis Date  . Bacterial vaginosis   . Pregnancy induced hypertension     Patient Active Problem List   Diagnosis Date Noted  . Nausea 05/09/2017  . Elevated blood pressure reading 05/09/2017  . Elevated serum hCG 03/12/2016  . Ovarian cyst, left 03/01/2015  . Abnormal uterine bleeding (AUB) 03/01/2015  . Dyspareunia 03/01/2015  . Smoker 10/23/2014  . Retained products of conception with hemorrhage 02/21/2013    Past Surgical History:  Procedure Laterality Date  . DILATION AND EVACUATION N/A 02/21/2013   Procedure: DILATATION AND EVACUATION;  Surgeon: Reva Boresanya S Pratt, MD;  Location: [facility];  Service: Gynecology;  Laterality: N/A;    OB History    Gravida Para Term Preterm AB Living   5 4 3 1   4    SAB TAB Ectopic Multiple Live  Births                   Home Medications    Prior to Admission medications   Medication Sig Start Date End Date Taking? Authorizing Provider  amLODipine (NORVASC) 2.5 MG tablet Take 2.5 mg by mouth daily.    [provider]  cephALEXin (KEFLEX) 500 MG capsule Take 1 capsule (500 mg total) 4 (four) times daily by mouth. 10/26/17   Hayden RasmussenMabe, David, NP  hydrochlorothiazide (HYDRODIURIL) 50 MG tablet Take 50 mg by mouth daily.    [provider]  HYDROcodone-acetaminophen (NORCO/VICODIN) 5-325 MG tablet Take 1 tablet by mouth every 6 (six) hours as needed for moderate pain or severe pain. 08/26/17   Mardella LaymanHagler, Brian, MD  ibuprofen (ADVIL,MOTRIN) 600 MG tablet Take 1 tablet (600 mg total) every 6 (six) hours as needed by mouth. 10/26/17   Hayden RasmussenMabe, David, NP  silver sulfADIAZINE (SILVADENE) 1 % cream Apply 1 application topically daily. 08/26/17   Mardella LaymanHagler, Brian, MD  traMADol (ULTRAM) 50 MG tablet Take 1 tablet (50 mg total) every 6 (six) hours as needed by mouth. 10/26/17   Hayden RasmussenMabe, David, NP    Family History Family History  Problem Relation Age of Onset  . Hypertension Mother     Social History Social History   Tobacco Use  . Smoking status: Former Smoker    Packs/day: 0.25    Types: Cigarettes  .  Smokeless tobacco: Former Neurosurgeon    Quit date: 01/08/2015  Substance Use Topics  . Alcohol use: Yes    Alcohol/week: 0.0 oz    Comment: occasionally  . Drug use: No     Allergies   Flagyl [metronidazole]   Review of Systems Review of Systems  Constitutional: Positive for activity change and fatigue. Negative for appetite change and fever.  HENT: Negative for congestion and nosebleeds.   Respiratory: Negative for cough and chest tightness.   Cardiovascular: Negative for chest pain.  Gastrointestinal: Positive for abdominal pain and nausea. Negative for vomiting.  Genitourinary: Negative for dysuria, hematuria, vaginal bleeding and vaginal discharge.  Musculoskeletal:  Negative for arthralgias and myalgias.  Skin: Negative for rash.  Neurological: Positive for weakness. Negative for dizziness, light-headedness and headaches.   all other systems are negative except as noted in the HPI and PMH.     Physical Exam Updated Vital Signs BP (!) 181/113 (BP Location: Right Arm)   Pulse (!) 59   Temp 98.5 F (36.9 C) (Oral)   Resp 18   SpO2 100%   Physical Exam  Constitutional: She is oriented to person, place, and time. She appears well-developed and well-nourished. No distress.  HENT:  Head: Normocephalic and atraumatic.  Mouth/Throat: Oropharynx is clear and moist. No oropharyngeal exudate.  Eyes: Conjunctivae and EOM are normal. Pupils are equal, round, and reactive to light.  Neck: Normal range of motion. Neck supple.  No meningismus.  Cardiovascular: Normal rate, regular rhythm, normal heart sounds and intact distal pulses.  No murmur heard. Pulmonary/Chest: Effort normal and breath sounds normal. No respiratory distress. She exhibits no tenderness.  Abdominal: Soft. There is tenderness. There is no rebound and no guarding.  TTP suprapubic and LLQ with voluntary guarding. No RLQ tenderness  Genitourinary:  Genitourinary Comments: Chaperone present. Normal external genitalia. Scant white vaginal discharge. No CMT. L adnexal tenderness. No R adnexal tenderness  Musculoskeletal: Normal range of motion. She exhibits no edema or tenderness.  Neurological: She is alert and oriented to person, place, and time. No cranial nerve deficit. She exhibits normal muscle tone. Coordination normal.   5/5 strength throughout. CN 2-12 intact.Equal grip strength.   Skin: Skin is warm. Capillary refill takes less than 2 seconds.  Psychiatric: She has a normal mood and affect. Her behavior is normal.  Nursing note and vitals reviewed.    ED Treatments / Results  Labs (all labs ordered are listed, but only abnormal results are displayed) Labs Reviewed  WET PREP,  GENITAL - Abnormal; Notable for the following components:      Result Value   Clue Cells Wet Prep HPF POC PRESENT (*)    WBC, Wet Prep HPF POC FEW (*)    All other components within normal limits  COMPREHENSIVE METABOLIC PANEL - Abnormal; Notable for the following components:   Potassium 3.4 (*)    ALT 12 (*)    All other components within normal limits  URINALYSIS, ROUTINE W REFLEX MICROSCOPIC - Abnormal; Notable for the following components:   APPearance HAZY (*)    Ketones, ur 20 (*)    All other components within normal limits  LIPASE, BLOOD  CBC  I-STAT BETA HCG BLOOD, ED ([facility], [facility], [facility] ONLY)  GC/CHLAMYDIA PROBE AMP ([place]) NOT AT [facility]    EKG  EKG Interpretation None       Radiology US Transvaginal Non-ob  Result Date: 12/22/2017 CLINICAL DATA:  LEFT pelvic pain. EXAM: TRANSABDOMINAL AND TRANSVAGINAL ULTRASOUND OF PELVIS  DOPPLER ULTRASOUND OF OVARIES TECHNIQUE: Both transabdominal and transvaginal ultrasound examinations of the pelvis were performed. Transabdominal technique was performed for global imaging of the pelvis including uterus, ovaries, adnexal regions, and pelvic cul-de-sac. It was necessary to proceed with endovaginal exam following the transabdominal exam to visualize the adnexa and endometrium. Color and duplex Doppler ultrasound was utilized to evaluate blood flow to the ovaries. COMPARISON:  Obstetric ultrasound December 29, 2015 FINDINGS: Uterus Measurements: 8.9 x 5 x 5.9 cm. No fibroids or other mass visualized. Endometrium Thickness: 16 mm.  No focal abnormality visualized. Right ovary Measurements: 3.3 x 2.4 x 2.5 cm. Normal appearance/no adnexal mass. Left ovary Measurements: 6.6 x 5.3 x 6.1 cm. 4.2 x 2.8 cm thinly septated avascular cyst with increased through transmission. Additional smaller cyst and/or dominant follicle. Pulsed Doppler evaluation of both ovaries demonstrates normal low-resistance arterial and venous waveforms. Other findings Trace free  fluid. IMPRESSION: 1. Complex cyst LEFT ovary measuring to 4.2 cm. Recommend follow-up sonogram in 6-12 weeks to ensure resolution. Electronically Signed   By: Awilda Metro M.D.   On: 12/22/2017 03:41   US Pelvis Complete  Result Date: 12/22/2017 CLINICAL DATA:  LEFT pelvic pain. EXAM: TRANSABDOMINAL AND TRANSVAGINAL ULTRASOUND OF PELVIS DOPPLER ULTRASOUND OF OVARIES TECHNIQUE: Both transabdominal and transvaginal ultrasound examinations of the pelvis were performed. Transabdominal technique was performed for global imaging of the pelvis including uterus, ovaries, adnexal regions, and pelvic cul-de-sac. It was necessary to proceed with endovaginal exam following the transabdominal exam to visualize the adnexa and endometrium. Color and duplex Doppler ultrasound was utilized to evaluate blood flow to the ovaries. COMPARISON:  Obstetric ultrasound December 29, 2015 FINDINGS: Uterus Measurements: 8.9 x 5 x 5.9 cm. No fibroids or other mass visualized. Endometrium Thickness: 16 mm.  No focal abnormality visualized. Right ovary Measurements: 3.3 x 2.4 x 2.5 cm. Normal appearance/no adnexal mass. Left ovary Measurements: 6.6 x 5.3 x 6.1 cm. 4.2 x 2.8 cm thinly septated avascular cyst with increased through transmission. Additional smaller cyst and/or dominant follicle. Pulsed Doppler evaluation of both ovaries demonstrates normal low-resistance arterial and venous waveforms. Other findings Trace free fluid. IMPRESSION: 1. Complex cyst LEFT ovary measuring to 4.2 cm. Recommend follow-up sonogram in 6-12 weeks to ensure resolution. Electronically Signed   By: Awilda Metro M.D.   On: 12/22/2017 03:41   [place] Art/ven Flow Abd Pelv Doppler  Result Date: 12/22/2017 CLINICAL DATA:  LEFT pelvic pain. EXAM: TRANSABDOMINAL AND TRANSVAGINAL ULTRASOUND OF PELVIS DOPPLER ULTRASOUND OF OVARIES TECHNIQUE: Both transabdominal and transvaginal ultrasound examinations of the pelvis were performed. Transabdominal technique was  performed for global imaging of the pelvis including uterus, ovaries, adnexal regions, and pelvic cul-de-sac. It was necessary to proceed with endovaginal exam following the transabdominal exam to visualize the adnexa and endometrium. Color and duplex Doppler ultrasound was utilized to evaluate blood flow to the ovaries. COMPARISON:  Obstetric ultrasound December 29, 2015 FINDINGS: Uterus Measurements: 8.9 x 5 x 5.9 cm. No fibroids or other mass visualized. Endometrium Thickness: 16 mm.  No focal abnormality visualized. Right ovary Measurements: 3.3 x 2.4 x 2.5 cm. Normal appearance/no adnexal mass. Left ovary Measurements: 6.6 x 5.3 x 6.1 cm. 4.2 x 2.8 cm thinly septated avascular cyst with increased through transmission. Additional smaller cyst and/or dominant follicle. Pulsed Doppler evaluation of both ovaries demonstrates normal low-resistance arterial and venous waveforms. Other findings Trace free fluid. IMPRESSION: 1. Complex cyst LEFT ovary measuring to 4.2 cm. Recommend follow-up sonogram in 6-12 weeks to ensure resolution. Electronically  Signed   By: Awilda Metro M.D.   On: 12/22/2017 03:41    Procedures Procedures (including critical care time)  Medications Ordered in ED Medications  HYDROcodone-acetaminophen (NORCO/VICODIN) 5-325 MG per tablet 1 tablet (not administered)  ondansetron (ZOFRAN-ODT) disintegrating tablet 4 mg (not administered)  ondansetron (ZOFRAN-ODT) disintegrating tablet 4 mg (4 mg Oral Given 12/22/17 0059)     Initial Impression / Assessment and Plan / ED Course  I have reviewed the triage vital signs and the nursing notes.  Pertinent labs & imaging results that were available during my care of the patient were reviewed by me and considered in my medical decision making (see chart for details).    3 days of lower abdominal pain worse in the left side.  Associated nausea and anorexia.  No vomiting or fever.  No urinary vaginal symptoms.  Patient appears  comfortable.  She is not pregnant.  Urinalysis is negative.  Left adnexal tenderness on pelvic exam.  Ultrasound performed and shows complex large left ovarian cyst.  No evidence of torsion.  Patient is comfortable on recheck.  She is eating chips in the bed.  Her abdomen is soft without peritoneal signs. BLood pressure has improved. She has been noncompliant with her medications.  Plan follow-up with symptom control and gynecology follow-up.  Also treat bacterial vaginosis.  Return precautions discussed Final Clinical Impressions(s) / ED Diagnoses   Final diagnoses:  Cyst of left ovary  Pelvic pain in female  Bacterial vaginosis    ED Discharge Orders    None       Kenniya Westrich, Jeannett Senior, MD 12/22/17 [phone]

## 2017-12-22 NOTE — Progress Notes (Signed)
Subjective:     Patient ID: Angela Manning, female   DOB: 02/09/1986, 32 y.o.   MRN: [id number]  Angela Manning is a 32 y.o. [id number] who presents today as FU from ED. She was seen there last night, and had a 4.2cm cyst found on US. She reports that the primary reason she went to the ED was because of fatigue and nausea. She had a pap in September while she was jail. She reports that it was abnormal. She will bring the paper back as she does not wish to have another pap today. She was also started on blood pressure medication while in jail, and she has not been able to take that as she has run out. She does not have a PCP.      Review of Systems  Constitutional: Positive for fatigue.  Gastrointestinal: Positive for nausea.  Genitourinary: Negative for pelvic pain, vaginal bleeding and vaginal discharge.       Objective:   Physical Exam  Constitutional: She is oriented to person, place, and time. She appears well-developed and well-nourished. No distress.  HENT:  Head: Normocephalic.  Cardiovascular: Normal rate.  Pulmonary/Chest: Effort normal.  Abdominal: Soft. There is no tenderness. There is no rebound.  Neurological: She is alert and oriented to person, place, and time.  Skin: Skin is warm and dry.  Psychiatric: She has a normal mood and affect.  Nursing note and vitals reviewed.      Assessment:     1. Ovarian cyst, left   2. Chronic hypertension        Plan:     Patient given 1 month of BP meds, and info for PCP FU.  DW patient that ovarian cyst is likely not causing fatigue. We will see if resuming BP meds helps. If not will FU with PCP. FU US in 12 weeks to ensure resolution of cyst Patient will bring pap paper to the office to see what further FU is needed based on those results.   Angela ShellerHeather Amulya Manning 3:19 PM 12/22/17

## 2017-12-22 NOTE — ED Triage Notes (Signed)
Reports pain in lower abdomen worse on left than right.  Hx of ovarian cysts.  Started 3 days ago.  Also c/o nausea but no vomiting.

## 2017-12-22 NOTE — Patient Instructions (Signed)
Primary care follow up  [facility] (will see you even if you do not have sickle cell): [phone] [facility]: [phone] [facility]: [phone]  

## 2017-12-22 NOTE — ED Notes (Signed)
ED Provider at bedside. 

## 2017-12-24 ENCOUNTER — Ambulatory Visit: Payer: Medicaid Other | Admitting: Obstetrics and Gynecology

## 2018-01-31 ENCOUNTER — Encounter (HOSPITAL_COMMUNITY): Payer: Self-pay | Admitting: *Deleted

## 2018-01-31 ENCOUNTER — Other Ambulatory Visit: Payer: Self-pay

## 2018-01-31 ENCOUNTER — Ambulatory Visit (HOSPITAL_COMMUNITY)
Admission: EM | Admit: 2018-01-31 | Discharge: 2018-01-31 | Disposition: A | Discharge status: Home / Self Care | Payer: Medicaid Other | Attending: Family Medicine | Admitting: Family Medicine

## 2018-01-31 DIAGNOSIS — B9689 Other specified bacterial agents as the cause of diseases classified elsewhere: Secondary | ICD-10-CM

## 2018-01-31 DIAGNOSIS — N760 Acute vaginitis: Secondary | ICD-10-CM

## 2018-01-31 LAB — POCT URINALYSIS DIP (DEVICE)
Bilirubin Urine: NEGATIVE
Glucose, UA: NEGATIVE mg/dL
Hgb urine dipstick: NEGATIVE
Ketones, ur: NEGATIVE mg/dL
Leukocytes, UA: NEGATIVE
Nitrite: NEGATIVE
Protein, ur: NEGATIVE mg/dL
Specific Gravity, Urine: >=1.03 (ref 1.005–1.030)
Urobilinogen, UA: 0.2 mg/dL (ref 0.0–1.0)
pH: 6 (ref 5.0–8.0)

## 2018-01-31 MED ORDER — CLINDAMYCIN HCL 300 MG PO CAPS: 300.0000 mg | ORAL_CAPSULE | Freq: Two times a day (BID) | ORAL | 0 refills | Status: AC

## 2018-01-31 NOTE — ED Triage Notes (Signed)
Per pt she has a smell to her urine and thinks she has UTI

## 2018-01-31 NOTE — ED Provider Notes (Signed)
  [facility]    CSN: [id number] Arrival date & time: 01/31/18  1821  Chief Complaint  Patient presents with  . Recurrent UTI    Angela Manning is a 32 y.o. female here for vaginal discharge.  Duration: 1 day Description of discharge: odorous Odor: Yes New sexual partner: No Urinary complaints: No IUD? No Denies fevers, bleeding, pregnancy abdominal pain.  ROS:  GU: +discharge, denies pain with urination  Past Medical History:  Diagnosis Date  . Bacterial vaginosis   . Ovarian cyst   . Pregnancy induced hypertension    Family History  Problem Relation Age of Onset  . Hypertension Mother     BP (!) 133/92 (BP Location: Left Arm)   Pulse 77   Temp 98.7 F (37.1 C) (Oral)   Resp 18   LMP 01/09/2018 (Exact Date)   SpO2 100%  Gen: Awake, alert, appears stated age Heart: RRR Lungs: CTAB, no accessory muscle use Abd: BS+, soft, NT, ND, no masses or organomegaly GU: deferred Psych: Age appropriate judgment and insight, nml mood and affect  BV (bacterial vaginosis)  Allergy to Flagyl, will use Clinda.  F/u prn. Pt voiced understanding and agreement to the plan.    Sharlene DoryWendling, Jaxden Blyden Paul, [place] 01/31/18 2019

## 2018-03-05 ENCOUNTER — Ambulatory Visit (HOSPITAL_COMMUNITY): Payer: Medicaid Other | Attending: Advanced Practice Midwife

## 2018-07-03 ENCOUNTER — Encounter (HOSPITAL_COMMUNITY): Payer: Self-pay

## 2018-07-03 ENCOUNTER — Ambulatory Visit (HOSPITAL_COMMUNITY)
Admission: EM | Admit: 2018-07-03 | Discharge: 2018-07-03 | Disposition: A | Discharge status: Home / Self Care | Payer: Medicaid Other | Attending: Internal Medicine | Admitting: Internal Medicine

## 2018-07-03 DIAGNOSIS — Z881 Allergy status to other antibiotic agents status: Secondary | ICD-10-CM | POA: Insufficient documentation

## 2018-07-03 DIAGNOSIS — Z87891 Personal history of nicotine dependence: Secondary | ICD-10-CM | POA: Insufficient documentation

## 2018-07-03 DIAGNOSIS — N898 Other specified noninflammatory disorders of vagina: Secondary | ICD-10-CM | POA: Insufficient documentation

## 2018-07-03 MED ORDER — CLINDAMYCIN HCL 300 MG PO CAPS: 300.0000 mg | ORAL_CAPSULE | Freq: Every day | ORAL | 0 refills | Status: AC

## 2018-07-03 MED ORDER — METRONIDAZOLE 500 MG PO TABS: 500.0000 mg | ORAL_TABLET | Freq: Two times a day (BID) | ORAL | 0 refills | Status: AC

## 2018-07-03 NOTE — ED Triage Notes (Signed)
Pt presents with complaints of vaginal odor x 1 day.

## 2018-07-03 NOTE — ED Notes (Signed)
Pt discharged by provider.

## 2018-07-03 NOTE — Discharge Instructions (Addendum)
I have sent in clindamycin for bacterial vaginosis, take 1 tablet daily for the next week.  We are testing you for Gonorrhea, Chlamydia, Trichomonas, Yeast and Bacterial Vaginosis. We will call you if anything is positive and let you know if you require any further treatment. Please inform partners of any positive results.   Please return if symptoms not improving with treatment, development of fever, nausea, vomiting, abdominal pain.

## 2018-07-04 NOTE — ED Provider Notes (Signed)
[facility]    CSN: [id number] Arrival date & time: 07/03/18  1629     History   Chief Complaint Chief Complaint  Patient presents with  . Vaginal Odor    HPI Angela Manning is a 32 y.o. female   History of recurrent BV infections presenting today for evaluation of vaginal odor.  Patient states that for approximately 1 days she has felt a smell that is off compared to her normal smell.  She denies any discharge, irritation or itching.  She denies changing any soaps.  She feels this is similar to the onset of her typical BV infections.  Has rash with metronidazole.  Denies urinary symptoms.  Patient is mainly sexually active with females.  Last menstrual period ended approximately 1 week ago.  HPI  Past Medical History:  Diagnosis Date  . Bacterial vaginosis   . Ovarian cyst   . Pregnancy induced hypertension     Patient Active Problem List   Diagnosis Date Noted  . Nausea 05/09/2017  . Elevated blood pressure reading 05/09/2017  . Elevated serum hCG 03/12/2016  . Ovarian cyst, left 03/01/2015  . Abnormal uterine bleeding (AUB) 03/01/2015  . Dyspareunia 03/01/2015  . Smoker 10/23/2014  . Retained products of conception with hemorrhage 02/21/2013    Past Surgical History:  Procedure Laterality Date  . DILATION AND EVACUATION N/A 02/21/2013   Procedure: DILATATION AND EVACUATION;  Surgeon: Reva Boresanya S Pratt, MD;  Location: [facility];  Service: Gynecology;  Laterality: N/A;    OB History    Gravida  4   Para  4   Term  3   Preterm  1   AB      Living  4     SAB      TAB      Ectopic      Multiple      Live Births               Home Medications    Prior to Admission medications   Medication Sig Start Date End Date Taking? Authorizing Provider  amLODipine (NORVASC) 2.5 MG tablet Take 1 tablet (2.5 mg total) by mouth daily. 12/22/17   Armando ReichertHogan, Heather D, CNM  clindamycin (CLEOCIN) 300 MG capsule Take 1 capsule (300 mg total) by mouth daily  for 7 days. 07/03/18 07/10/18  Iviana Blasingame C, PA-C  hydrochlorothiazide (HYDRODIURIL) 50 MG tablet Take 1 tablet (50 mg total) by mouth daily. 12/22/17   Armando ReichertHogan, Heather D, CNM  metroNIDAZOLE (FLAGYL) 500 MG tablet Take 1 tablet (500 mg total) by mouth 2 (two) times daily for 7 days. 07/03/18 07/10/18  Jerlene Rockers, Junius CreamerHallie C, PA-C    Family History Family History  Problem Relation Age of Onset  . Hypertension Mother     Social History Social History   Tobacco Use  . Smoking status: Former Smoker    Packs/day: 0.25    Types: Cigarettes  . Smokeless tobacco: Former NeurosurgeonUser    Quit date: 01/08/2015  Substance Use Topics  . Alcohol use: Yes    Alcohol/week: 0.0 oz    Comment: occasionally  . Drug use: No     Allergies   Flagyl [metronidazole]   Review of Systems Review of Systems  Constitutional: Negative for fever.  Respiratory: Negative for shortness of breath.   Cardiovascular: Negative for chest pain.  Gastrointestinal: Negative for abdominal pain, diarrhea, nausea and vomiting.  Genitourinary: Negative for dysuria, flank pain, genital sores, hematuria, menstrual problem, vaginal bleeding, vaginal  discharge and vaginal pain.  Musculoskeletal: Negative for back pain.  Skin: Negative for rash.  Neurological: Negative for dizziness, light-headedness and headaches.     Physical Exam Triage Vital Signs ED Triage Vitals  Enc Vitals Group     BP 07/03/18 1658 (!) 154/106     Pulse Rate 07/03/18 1658 89     Resp 07/03/18 1658 16     Temp 07/03/18 1658 98.4 F (36.9 C)     Temp Source 07/03/18 1658 Oral     SpO2 07/03/18 1658 97 %     Weight --      Height --      Head Circumference --      Peak Flow --      Pain Score 07/03/18 1710 0     Pain Loc --      Pain Edu? --      Excl. in GC? --    No data found.  Updated Vital Signs BP (!) 154/106 (BP Location: Left Arm) Comment: verified 3 times on both arms  Pulse 89   Temp 98.4 F (36.9 C) (Oral)   Resp 16   LMP  06/29/2018   SpO2 97%   Visual Acuity Right Eye Distance:   Left Eye Distance:   Bilateral Distance:    Right Eye Near:   Left Eye Near:    Bilateral Near:     Physical Exam  Constitutional: She is oriented to person, place, and time. She appears well-developed and well-nourished.  No acute distress  HENT:  Head: Normocephalic and atraumatic.  Nose: Nose normal.  Eyes: Conjunctivae are normal.  Neck: Neck supple.  Cardiovascular: Normal rate.  Pulmonary/Chest: Effort normal. No respiratory distress.  Abdominal: She exhibits no distension.  Genitourinary:  Genitourinary Comments: Deferred  Musculoskeletal: Normal range of motion.  Neurological: She is alert and oriented to person, place, and time.  Skin: Skin is warm and dry.  Psychiatric: She has a normal mood and affect.  Nursing note and vitals reviewed.    UC Treatments / Results  Labs (all labs ordered are listed, but only abnormal results are displayed) Labs Reviewed  CERVICOVAGINAL ANCILLARY ONLY    EKG None  Radiology No results found.  Procedures Procedures (including critical care time)  Medications Ordered in UC Medications - No data to display  Initial Impression / Assessment and Plan / UC Course  I have reviewed the triage vital signs and the nursing notes.  Pertinent labs & imaging results that were available during my care of the patient were reviewed by me and considered in my medical decision making (see chart for details).     Vaginal swab obtained to check for GC/chlamydia/trichomonas/yeast/BV.  We will go ahead and initiate empiric treatment for bacterial vaginosis with clindamycin 300 mg daily given patient's allergy to metronidazole.  Will call patient with results and alter treatment as needed.Discussed strict return precautions. Patient verbalized understanding and is agreeable with plan.  Final Clinical Impressions(s) / UC Diagnoses   Final diagnoses:  Vaginal odor      Discharge Instructions     I have sent in clindamycin for bacterial vaginosis, take 1 tablet daily for the next week.  We are testing you for Gonorrhea, Chlamydia, Trichomonas, Yeast and Bacterial Vaginosis. We will call you if anything is positive and let you know if you require any further treatment. Please inform partners of any positive results.   Please return if symptoms not improving with treatment, development of fever, nausea, vomiting,  abdominal pain.    ED Prescriptions    Medication Sig Dispense Auth. Provider   metroNIDAZOLE (FLAGYL) 500 MG tablet Take 1 tablet (500 mg total) by mouth 2 (two) times daily for 7 days. 14 tablet [place] Hagger C, PA-C   clindamycin (CLEOCIN) 300 MG capsule Take 1 capsule (300 mg total) by mouth daily for 7 days. 7 capsule Falon Flinchum C, PA-C     Controlled Substance Prescriptions [place] Controlled Substance Registry consulted? Not Applicable   Lew Dawes, [place] 07/04/18 2233

## 2018-07-06 LAB — CERVICOVAGINAL ANCILLARY ONLY
Bacterial vaginitis: POSITIVE — AB
Candida vaginitis: NEGATIVE
Chlamydia: NEGATIVE
Neisseria Gonorrhea: NEGATIVE
Trichomonas: NEGATIVE

## 2018-07-17 ENCOUNTER — Ambulatory Visit (HOSPITAL_COMMUNITY)
Admission: EM | Admit: 2018-07-17 | Discharge: 2018-07-17 | Disposition: A | Discharge status: Home / Self Care | Payer: Medicaid Other | Attending: Family Medicine | Admitting: Family Medicine

## 2018-07-17 ENCOUNTER — Encounter (HOSPITAL_COMMUNITY): Payer: Self-pay

## 2018-07-17 DIAGNOSIS — N760 Acute vaginitis: Secondary | ICD-10-CM | POA: Diagnosis not present

## 2018-07-17 DIAGNOSIS — B9689 Other specified bacterial agents as the cause of diseases classified elsewhere: Secondary | ICD-10-CM

## 2018-07-17 MED ORDER — CLINDAMYCIN HCL 300 MG PO CAPS: 300.0000 mg | ORAL_CAPSULE | Freq: Two times a day (BID) | ORAL | 1 refills | Status: DC

## 2018-07-17 NOTE — ED Triage Notes (Signed)
Pt presents with symptoms of BV

## 2018-07-17 NOTE — Discharge Instructions (Addendum)
Take the clindamycin 2 x a day Be careful with hygiene products Consider a probiotic Return as needed

## 2018-07-17 NOTE — ED Provider Notes (Signed)
[facility]    CSN: [id number] Arrival date & time: 07/17/18  [id number]     History   Chief Complaint Chief Complaint  Patient presents with  . BV symptoms    HPI Angela Manning is a 32 y.o. female.   HPI  Patient is here to be evaluated for "BV".  She states she has recurring BV.  She states she used a new soap and she thinks this may have irritated her skin.  She is usually very careful to use only gentle products.  She states she has no concern of having STD.  She does not wish to be tested.  For the last couple of days she has had a vaginal odor and some mild external irritation.  No significant discharge.  No fever chills.  No abdominal pain.  No urinary symptoms.  Past Medical History:  Diagnosis Date  . Bacterial vaginosis   . Ovarian cyst   . Pregnancy induced hypertension     Patient Active Problem List   Diagnosis Date Noted  . Nausea 05/09/2017  . Elevated blood pressure reading 05/09/2017  . Elevated serum hCG 03/12/2016  . Ovarian cyst, left 03/01/2015  . Abnormal uterine bleeding (AUB) 03/01/2015  . Dyspareunia 03/01/2015  . Smoker 10/23/2014  . Retained products of conception with hemorrhage 02/21/2013    Past Surgical History:  Procedure Laterality Date  . DILATION AND EVACUATION N/A 02/21/2013   Procedure: DILATATION AND EVACUATION;  Surgeon: Reva Boresanya S Pratt, MD;  Location: [facility];  Service: Gynecology;  Laterality: N/A;    OB History    Gravida  4   Para  4   Term  3   Preterm  1   AB      Living  4     SAB      TAB      Ectopic      Multiple      Live Births               Home Medications    Prior to Admission medications   Medication Sig Start Date End Date Taking? Authorizing Provider  amLODipine (NORVASC) 2.5 MG tablet Take 1 tablet (2.5 mg total) by mouth daily. 12/22/17   Armando ReichertHogan, Heather D, CNM  clindamycin (CLEOCIN) 300 MG capsule Take 1 capsule (300 mg total) by mouth 2 (two) times daily. 07/17/18   Eustace MooreNelson,  Yvonne Sue, MD  hydrochlorothiazide (HYDRODIURIL) 50 MG tablet Take 1 tablet (50 mg total) by mouth daily. 12/22/17   Armando ReichertHogan, Heather D, CNM    Family History Family History  Problem Relation Age of Onset  . Hypertension Mother     Social History Social History   Tobacco Use  . Smoking status: Former Smoker    Packs/day: 0.25    Types: Cigarettes  . Smokeless tobacco: Former NeurosurgeonUser    Quit date: 01/08/2015  Substance Use Topics  . Alcohol use: Yes    Alcohol/week: 0.0 standard drinks    Comment: occasionally  . Drug use: No     Allergies   Flagyl [metronidazole]   Review of Systems Review of Systems  Constitutional: Negative for chills and fever.  HENT: Negative for ear pain and sore throat.   Eyes: Negative for pain and visual disturbance.  Respiratory: Negative for cough and shortness of breath.   Cardiovascular: Negative for chest pain and palpitations.  Gastrointestinal: Negative for abdominal pain and vomiting.  Genitourinary: Negative for difficulty urinating, dysuria, flank pain, frequency, hematuria, urgency  and vaginal discharge.  Musculoskeletal: Negative for arthralgias and back pain.  Skin: Negative for color change and rash.  Neurological: Negative for seizures and syncope.  All other systems reviewed and are negative.    Physical Exam Triage Vital Signs ED Triage Vitals  Enc Vitals Group     BP 07/17/18 0929 (!) 145/102     Pulse Rate 07/17/18 0929 71     Resp 07/17/18 0929 20     Temp 07/17/18 0929 97.8 F (36.6 C)     Temp Source 07/17/18 0929 Oral     SpO2 07/17/18 0929 99 %     Weight --      Height --      Head Circumference --      Peak Flow --      Pain Score 07/17/18 0928 0     Pain Loc --      Pain Edu? --      Excl. in GC? --    No data found.  Updated Vital Signs BP (!) 145/102 (BP Location: Left Arm) Comment: Didnt take medication this morning  Pulse 71   Temp 97.8 F (36.6 C) (Oral)   Resp 20   LMP 06/29/2018   SpO2 99%    Visual Acuity Right Eye Distance:   Left Eye Distance:   Bilateral Distance:    Right Eye Near:   Left Eye Near:    Bilateral Near:     Physical Exam  Constitutional: She appears well-developed and well-nourished. No distress.  HENT:  Head: Normocephalic and atraumatic.  Mouth/Throat: Oropharynx is clear and moist.  Eyes: Pupils are equal, round, and reactive to light. Conjunctivae are normal.  Neck: Normal range of motion.  Cardiovascular: Normal rate.  Pulmonary/Chest: Effort normal. No respiratory distress.  Abdominal: Soft. She exhibits no distension.  Musculoskeletal: Normal range of motion. She exhibits no edema.  Neurological: She is alert.  Skin: Skin is warm and dry.     UC Treatments / Results  Labs (all labs ordered are listed, but only abnormal results are displayed) Labs Reviewed - No data to display  EKG None  Radiology No results found.  Procedures Procedures (including critical care time)  Medications Ordered in UC Medications - No data to display  Initial Impression / Assessment and Plan / UC Course  I have reviewed the triage vital signs and the nursing notes.  Pertinent labs & imaging results that were available during my care of the patient were reviewed by me and considered in my medical decision making (see chart for details).     Recurring BV by history.  Discussed prevention.  Discussed probiotics.  Treat with clindamycin given her metronidazole allergy. Final Clinical Impressions(s) / UC Diagnoses   Final diagnoses:  BV (bacterial vaginosis)     Discharge Instructions     Take the clindamycin 2 x a day Be careful with hygiene products Consider a probiotic Return as needed   ED Prescriptions    Medication Sig Dispense Auth. Provider   clindamycin (CLEOCIN) 300 MG capsule Take 1 capsule (300 mg total) by mouth 2 (two) times daily. 14 capsule Eustace Moore, MD     Controlled Substance Prescriptions [place] Controlled  Substance Registry consulted? Not Applicable   Eustace Moore, MD 07/17/18 1008

## 2018-08-14 ENCOUNTER — Encounter (HOSPITAL_COMMUNITY): Payer: Self-pay

## 2018-08-14 ENCOUNTER — Ambulatory Visit (HOSPITAL_COMMUNITY)
Admission: EM | Admit: 2018-08-14 | Discharge: 2018-08-14 | Disposition: A | Discharge status: Home / Self Care | Payer: Medicaid Other | Attending: Internal Medicine | Admitting: Internal Medicine

## 2018-08-14 DIAGNOSIS — Z3202 Encounter for pregnancy test, result negative: Secondary | ICD-10-CM | POA: Diagnosis not present

## 2018-08-14 DIAGNOSIS — R102 Pelvic and perineal pain: Secondary | ICD-10-CM | POA: Insufficient documentation

## 2018-08-14 DIAGNOSIS — Z87891 Personal history of nicotine dependence: Secondary | ICD-10-CM | POA: Insufficient documentation

## 2018-08-14 DIAGNOSIS — R1032 Left lower quadrant pain: Secondary | ICD-10-CM | POA: Diagnosis not present

## 2018-08-14 DIAGNOSIS — Z79899 Other long term (current) drug therapy: Secondary | ICD-10-CM | POA: Diagnosis not present

## 2018-08-14 DIAGNOSIS — R109 Unspecified abdominal pain: Secondary | ICD-10-CM | POA: Diagnosis present

## 2018-08-14 LAB — POCT URINALYSIS DIP (DEVICE)
Bilirubin Urine: NEGATIVE
Glucose, UA: NEGATIVE mg/dL
Hgb urine dipstick: NEGATIVE
Ketones, ur: NEGATIVE mg/dL
Leukocytes, UA: NEGATIVE
Nitrite: NEGATIVE
Protein, ur: NEGATIVE mg/dL
Specific Gravity, Urine: 1.02 (ref 1.005–1.030)
Urobilinogen, UA: 1 mg/dL (ref 0.0–1.0)
pH: 7 (ref 5.0–8.0)

## 2018-08-14 LAB — POCT PREGNANCY, URINE: Preg Test, Ur: NEGATIVE

## 2018-08-14 MED ORDER — NAPROXEN 500 MG PO TABS: 500.0000 mg | ORAL_TABLET | Freq: Two times a day (BID) | ORAL | 0 refills | Status: DC

## 2018-08-14 NOTE — Discharge Instructions (Addendum)
Swab for common pelvic infections was obtained at the urgent care this evening.  The urgent care will contact you if further treatment is needed.  Urine test did not suggest UTI or kidney stone; urine pregnancy test was negative.  Please followup with [facility] for further evaluation of left pelvic pain--the clinic will contact you with an appointment.

## 2018-08-14 NOTE — ED Provider Notes (Signed)
[facility]    CSN: [id number] Arrival date & time: 08/14/18  1743     History   Chief Complaint Chief Complaint  Patient presents with  . Abdominal Pain    HPI Angela Manning is a 32 y.o. female. She presents today with 2d hx LLQ/pelvic pain, similar to pain with previously dx'ed ovarian cyst which has come and gone over the last several ?months.  No change in BMs, no dysuria/urinary frequency.  No vag discharge, no unusual vag bleeding.  Pain is crampy/sharp, intermittent.  Woke her up at 2am today.  No vomiting, was able to eat today.  No fever.  LMP 8/19.  Denies chance of pregnancy, not sexually active with men.    HPI  Past Medical History:  Diagnosis Date  . Bacterial vaginosis   . Ovarian cyst   . Pregnancy induced hypertension     Patient Active Problem List   Diagnosis Date Noted  . Nausea 05/09/2017  . Elevated blood pressure reading 05/09/2017  . Elevated serum hCG 03/12/2016  . Ovarian cyst, left 03/01/2015  . Abnormal uterine bleeding (AUB) 03/01/2015  . Dyspareunia 03/01/2015  . Smoker 10/23/2014  . Retained products of conception with hemorrhage 02/21/2013    Past Surgical History:  Procedure Laterality Date  . DILATION AND EVACUATION N/A 02/21/2013   Procedure: DILATATION AND EVACUATION;  Surgeon: Reva Bores, MD;  Location: [facility];  Service: Gynecology;  Laterality: N/A;    OB History    Gravida  4   Para  4   Term  3   Preterm  1   AB      Living  4     SAB      TAB      Ectopic      Multiple      Live Births               Home Medications    Prior to Admission medications   Medication Sig Start Date End Date Taking? Authorizing Provider  hydrochlorothiazide (HYDRODIURIL) 50 MG tablet Take 1 tablet (50 mg total) by mouth daily. 12/22/17  Yes Thressa Sheller D, CNM  amLODipine (NORVASC) 2.5 MG tablet Take 1 tablet (2.5 mg total) by mouth daily. 12/22/17   Armando Reichert, CNM  naproxen (NAPROSYN) 500 MG  tablet Take 1 tablet (500 mg total) by mouth 2 (two) times daily. 08/14/18   Isa Rankin, MD    Family History Family History  Problem Relation Age of Onset  . Hypertension Mother     Social History Social History   Tobacco Use  . Smoking status: Former Smoker    Packs/day: 0.25    Types: Cigarettes  . Smokeless tobacco: Former Neurosurgeon    Quit date: 01/08/2015  Substance Use Topics  . Alcohol use: Yes    Alcohol/week: 0.0 standard drinks    Comment: occasionally  . Drug use: No     Allergies   Flagyl [metronidazole]   Review of Systems Review of Systems  All other systems reviewed and are negative.    Physical Exam Triage Vital Signs ED Triage Vitals  Enc Vitals Group     BP 08/14/18 1829 (!) 137/102     Pulse Rate 08/14/18 1829 72     Resp 08/14/18 1829 19     Temp 08/14/18 1829 99.3 F (37.4 C)     Temp src --      SpO2 08/14/18 1829 100 %  Weight --      Height --      Pain Score 08/14/18 1828 7     Pain Loc --    Updated Vital Signs BP (!) 137/102   Pulse 72   Temp 99.3 F (37.4 C)   Resp 19   LMP 07/26/2018   SpO2 100%   Visual Acuity Right Eye Distance:   Left Eye Distance:   Bilateral Distance:    Right Eye Near:   Left Eye Near:    Bilateral Near:     Physical Exam  Constitutional: She is oriented to person, place, and time. No distress.  HENT:  Head: Atraumatic.  Eyes:  Conjugate gaze observed, no eye redness/discharge  Neck: Neck supple.  Cardiovascular: Normal rate and regular rhythm.  Pulmonary/Chest: No respiratory distress. She has no wheezes. She has no rales.  Lungs clear, symmetric breath sounds   Abdominal: Soft. She exhibits no distension. There is no rebound and no guarding.  Mild, focal low left pelvic tenderness to deep palpation, just left of midline  Musculoskeletal: Normal range of motion.  Neurological: She is alert and oriented to person, place, and time.  Skin: Skin is warm and dry.  Nursing note  and vitals reviewed.    UC Treatments / Results  Labs Results for orders placed or performed during the hospital encounter of 08/14/18  POCT urinalysis dip (device)  Result Value Ref Range   Glucose, UA NEGATIVE NEGATIVE mg/dL   Bilirubin Urine NEGATIVE NEGATIVE   Ketones, ur NEGATIVE NEGATIVE mg/dL   Specific Gravity, Urine 1.020 1.005 - 1.030   Hgb urine dipstick NEGATIVE NEGATIVE   pH 7.0 5.0 - 8.0   Protein, ur NEGATIVE NEGATIVE mg/dL   Urobilinogen, UA 1.0 0.0 - 1.0 mg/dL   Nitrite NEGATIVE NEGATIVE   Leukocytes, UA NEGATIVE NEGATIVE  Pregnancy, urine POC  Result Value Ref Range   Preg Test, Ur NEGATIVE NEGATIVE     Final Clinical Impressions(s) / UC Diagnoses   Final diagnoses:  Acute left lower quadrant pain  Pelvic pain in female     Discharge Instructions     Swab for common pelvic infections was obtained at the urgent care this evening.  The urgent care will contact you if further treatment is needed.  Urine test did not suggest UTI or kidney stone; urine pregnancy test was negative.  Please followup with [facility] for further evaluation of left pelvic pain--the clinic will contact you with an appointment.   ED Prescriptions    Medication Sig Dispense Auth. Provider   naproxen (NAPROSYN) 500 MG tablet Take 1 tablet (500 mg total) by mouth 2 (two) times daily. 14 tablet Isa Rankin, MD        Isa Rankin, MD 08/14/18 [id number]

## 2018-08-14 NOTE — ED Triage Notes (Signed)
Pt presents with complaints of 2 day history of left sided lower quadrant pain. Reports history of ovarian cyst and states that this feels the same. Complaints of nausea as well.

## 2018-08-17 LAB — CERVICOVAGINAL ANCILLARY ONLY
Bacterial vaginitis: POSITIVE — AB
Candida vaginitis: NEGATIVE
Chlamydia: NEGATIVE
Neisseria Gonorrhea: NEGATIVE
Trichomonas: NEGATIVE

## 2018-08-18 ENCOUNTER — Telehealth (HOSPITAL_COMMUNITY): Payer: Self-pay

## 2018-08-18 NOTE — Telephone Encounter (Signed)
Bacterial vaginosis is positive. This was not treated at the urgent care visit. Pt is allergic to Flagyl, will contact to assess patient and symptoms prior to calling anything in for her.  Attempted to call patient and make aware. No answer at this time.

## 2018-09-08 ENCOUNTER — Encounter: Payer: Self-pay | Admitting: Obstetrics and Gynecology

## 2018-09-24 ENCOUNTER — Ambulatory Visit (HOSPITAL_COMMUNITY)
Admission: EM | Admit: 2018-09-24 | Discharge: 2018-09-24 | Disposition: A | Discharge status: Home / Self Care | Payer: Medicaid Other | Attending: Family Medicine | Admitting: Family Medicine

## 2018-09-24 ENCOUNTER — Encounter (HOSPITAL_COMMUNITY): Payer: Self-pay

## 2018-09-24 DIAGNOSIS — I10 Essential (primary) hypertension: Secondary | ICD-10-CM

## 2018-09-24 MED ORDER — HYDROCHLOROTHIAZIDE 50 MG PO TABS: 50.0000 mg | ORAL_TABLET | Freq: Every day | ORAL | 1 refills | Status: DC

## 2018-09-24 MED ORDER — AMLODIPINE BESYLATE 2.5 MG PO TABS: 2.5000 mg | ORAL_TABLET | Freq: Every day | ORAL | 1 refills | Status: DC

## 2018-09-24 NOTE — Discharge Instructions (Signed)
Your medications were sent to the pharmacy Follow-up with your primary care provider on the 30th this plan.

## 2018-09-24 NOTE — ED Provider Notes (Signed)
[facility]    CSN: [id number] Arrival date & time: 09/24/18  7829     History   Chief Complaint Chief Complaint  Patient presents with  . Hypertension    HPI Angela Manning is Manning 32 y.o. female.   Patient is Manning 32 year old female past medical history of hypertension.  She presents for medication refill.  She has not had her blood pressure medication in over 2 weeks.  She reports some intermittent headaches over the last week that are somewhat relieved from extra Tylenol.  She denies any associated dizziness, weakness, chest pain, shortness of breath.  She is scheduled to see her primary care provider on the 30th of this month.  ROS per HPI    Hypertension     Past Medical History:  Diagnosis Date  . Bacterial vaginosis   . Ovarian cyst   . Pregnancy induced hypertension     Patient Active Problem List   Diagnosis Date Noted  . Nausea 05/09/2017  . Elevated blood pressure reading 05/09/2017  . Elevated serum hCG 03/12/2016  . Ovarian cyst, left 03/01/2015  . Abnormal uterine bleeding (AUB) 03/01/2015  . Dyspareunia 03/01/2015  . Smoker 10/23/2014  . Retained products of conception with hemorrhage 02/21/2013    Past Surgical History:  Procedure Laterality Date  . DILATION AND EVACUATION N/Manning 02/21/2013   Procedure: DILATATION AND EVACUATION;  Surgeon: Angela Bores, MD;  Location: [facility];  Service: Gynecology;  Laterality: N/Manning;    OB History    Gravida  4   Para  4   Term  3   Preterm  1   AB      Living  4     SAB      TAB      Ectopic      Multiple      Live Births               Home Medications    Prior to Admission medications   Medication Sig Start Date End Date Taking? Authorizing Provider  amLODipine (NORVASC) 2.5 MG tablet Take 1 tablet (2.5 mg total) by mouth daily. 09/24/18   Angela Manning A, NP  hydrochlorothiazide (HYDRODIURIL) 50 MG tablet Take 1 tablet (50 mg total) by mouth daily. 09/24/18   Angela Manning, Angela Manchester A,  NP  naproxen (NAPROSYN) 500 MG tablet Take 1 tablet (500 mg total) by mouth 2 (two) times daily. 08/14/18   Angela Rankin, MD    Family History Family History  Problem Relation Age of Onset  . Hypertension Mother     Social History Social History   Tobacco Use  . Smoking status: Former Smoker    Packs/day: 0.25    Types: Cigarettes  . Smokeless tobacco: Former Neurosurgeon    Quit date: 01/08/2015  Substance Use Topics  . Alcohol use: Yes    Alcohol/week: 0.0 standard drinks    Comment: occasionally  . Drug use: No     Allergies   Flagyl [metronidazole]   Review of Systems Review of Systems   Physical Exam Triage Vital Signs ED Triage Vitals  Enc Vitals Group     BP 09/24/18 0834 (!) 148/112     Pulse Rate 09/24/18 0834 73     Resp 09/24/18 0834 18     Temp 09/24/18 0834 98.5 F (36.9 C)     Temp Source 09/24/18 0834 Oral     SpO2 09/24/18 0834 99 %     Weight 09/24/18 0836  127 lb (57.6 kg)     Height --      Head Circumference --      Peak Flow --      Pain Score 09/24/18 0836 9     Pain Loc --      Pain Edu? --      Excl. in GC? --    No data found.  Updated Vital Signs BP (!) 148/112 (BP Location: Right Arm)   Pulse 73   Temp 98.5 F (36.9 C) (Oral)   Resp 18   Wt 127 lb (57.6 kg)   LMP 09/18/2018   SpO2 99%   BMI 19.89 kg/m   Visual Acuity Right Eye Distance:   Left Eye Distance:   Bilateral Distance:    Right Eye Near:   Left Eye Near:    Bilateral Near:     Physical Exam  Constitutional: She is oriented to person, place, and time. She appears well-developed and well-nourished.  Very pleasant. Non toxic or ill appearing.   HENT:  Head: Normocephalic and atraumatic.  Eyes: Pupils are equal, round, and reactive to light. Conjunctivae and EOM are normal.  Neck: Normal range of motion.  Cardiovascular: Normal rate, regular rhythm and normal heart sounds.  Pulmonary/Chest: Effort normal and breath sounds normal.  Very pleasant. Non  toxic or ill appearing.   Musculoskeletal: Normal range of motion.  Neurological: She is alert and oriented to person, place, and time.  No focal neuro deficits.  Skin: Skin is warm and dry.  Psychiatric: She has Manning normal mood and affect.  Nursing note and vitals reviewed.    UC Treatments / Results  Labs (all labs ordered are listed, but only abnormal results are displayed) Labs Reviewed - No data to display  EKG None  Radiology No results found.  Procedures Procedures (including critical care time)  Medications Ordered in UC Medications - No data to display  Initial Impression / Assessment and Plan / UC Course  I have reviewed the triage vital signs and the nursing notes.  Pertinent labs & imaging results that were available during my care of the patient were reviewed by me and considered in my medical decision making (see chart for details).     Medication refill- We will refill her HCTZ and amlodipine.  Educated that she is on Manning very high dose of HCTZ and low dose of amlodipine and may want to talk to her doctor about some medication adjustments.  Pt agreed.  Final Clinical Impressions(s) / UC Diagnoses   Final diagnoses:  Essential hypertension     Discharge Instructions     Your medications were sent to the pharmacy Follow-up with your primary care provider on the 30th this plan.    ED Prescriptions    Medication Sig Dispense Auth. Provider   amLODipine (NORVASC) 2.5 MG tablet Take 1 tablet (2.5 mg total) by mouth daily. 30 tablet Angela Manning A, NP   hydrochlorothiazide (HYDRODIURIL) 50 MG tablet Take 1 tablet (50 mg total) by mouth daily. 30 tablet Angela Manning A, NP     Controlled Substance Prescriptions [place] Controlled Substance Registry consulted? Not Applicable   Angela Aris, NP 09/24/18 [phone]

## 2018-09-24 NOTE — ED Triage Notes (Signed)
Pt  States she has been without her b/p med 2 weeks. Pt has a headache.

## 2018-10-07 ENCOUNTER — Encounter: Payer: Self-pay | Admitting: Obstetrics and Gynecology

## 2018-10-07 ENCOUNTER — Ambulatory Visit (INDEPENDENT_AMBULATORY_CARE_PROVIDER_SITE_OTHER): Payer: Medicaid Other | Admitting: Obstetrics and Gynecology

## 2018-10-07 ENCOUNTER — Other Ambulatory Visit (HOSPITAL_COMMUNITY)
Admission: RE | Admit: 2018-10-07 | Discharge: 2018-10-07 | Disposition: A | Discharge status: Home / Self Care | Payer: Medicaid Other | Source: Ambulatory Visit | Attending: Obstetrics and Gynecology | Admitting: Obstetrics and Gynecology

## 2018-10-07 VITALS — BP 146/116 | HR 84 | Ht 67.0 in | Wt 118.1 lb

## 2018-10-07 DIAGNOSIS — N83202 Unspecified ovarian cyst, left side: Secondary | ICD-10-CM | POA: Diagnosis not present

## 2018-10-07 DIAGNOSIS — R102 Pelvic and perineal pain: Secondary | ICD-10-CM | POA: Diagnosis not present

## 2018-10-07 DIAGNOSIS — Z30011 Encounter for initial prescription of contraceptive pills: Secondary | ICD-10-CM | POA: Diagnosis not present

## 2018-10-07 DIAGNOSIS — Z124 Encounter for screening for malignant neoplasm of cervix: Secondary | ICD-10-CM

## 2018-10-07 DIAGNOSIS — E079 Disorder of thyroid, unspecified: Secondary | ICD-10-CM

## 2018-10-07 DIAGNOSIS — R634 Abnormal weight loss: Secondary | ICD-10-CM | POA: Diagnosis not present

## 2018-10-07 DIAGNOSIS — Z113 Encounter for screening for infections with a predominantly sexual mode of transmission: Secondary | ICD-10-CM

## 2018-10-07 DIAGNOSIS — F3289 Other specified depressive episodes: Secondary | ICD-10-CM | POA: Diagnosis not present

## 2018-10-07 DIAGNOSIS — I10 Essential (primary) hypertension: Secondary | ICD-10-CM

## 2018-10-07 MED ORDER — NORETHINDRONE 0.35 MG PO TABS: 1.0000 | ORAL_TABLET | Freq: Every day | ORAL | 11 refills | Status: DC

## 2018-10-07 NOTE — Patient Instructions (Signed)
Etonogestrel implant What is this medicine? ETONOGESTREL (et oh noe JES trel) is a contraceptive (birth control) device. It is used to prevent pregnancy. It can be used for up to 3 years. This medicine may be used for other purposes; ask your health care provider or pharmacist if you have questions. COMMON BRAND NAME(S): Implanon, Nexplanon What should I tell my health care provider before I take this medicine? They need to know if you have any of these conditions: -abnormal vaginal bleeding -blood vessel disease or blood clots -cancer of the breast, cervix, or liver -depression -diabetes -gallbladder disease -headaches -heart disease or recent heart attack -high blood pressure -high cholesterol -kidney disease -liver disease -renal disease -seizures -tobacco smoker -an unusual or allergic reaction to etonogestrel, other hormones, anesthetics or antiseptics, medicines, foods, dyes, or preservatives -pregnant or trying to get pregnant -breast-feeding How should I use this medicine? This device is inserted just under the skin on the inner side of your upper arm by a health care professional. Talk to your pediatrician regarding the use of this medicine in children. Special care may be needed. Overdosage: If you think you have taken too much of this medicine contact a poison control center or emergency room at once. NOTE: This medicine is only for you. Do not share this medicine with others. What if I miss a dose? This does not apply. What may interact with this medicine? Do not take this medicine with any of the following medications: -amprenavir -bosentan -fosamprenavir This medicine may also interact with the following medications: -barbiturate medicines for inducing sleep or treating seizures -certain medicines for fungal infections like ketoconazole and itraconazole -grapefruit juice -griseofulvin -medicines to treat seizures like carbamazepine, felbamate, oxcarbazepine,  phenytoin, topiramate -modafinil -phenylbutazone -rifampin -rufinamide -some medicines to treat HIV infection like atazanavir, indinavir, lopinavir, nelfinavir, tipranavir, ritonavir -St. John's wort This list may not describe all possible interactions. Give your health care provider a list of all the medicines, herbs, non-prescription drugs, or dietary supplements you use. Also tell them if you smoke, drink alcohol, or use illegal drugs. Some items may interact with your medicine. What should I watch for while using this medicine? This product does not protect you against HIV infection (AIDS) or other sexually transmitted diseases. You should be able to feel the implant by pressing your fingertips over the skin where it was inserted. Contact your doctor if you cannot feel the implant, and use a non-hormonal birth control method (such as condoms) until your doctor confirms that the implant is in place. If you feel that the implant may have broken or become bent while in your arm, contact your healthcare provider. What side effects may I notice from receiving this medicine? Side effects that you should report to your doctor or health care professional as soon as possible: -allergic reactions like skin rash, itching or hives, swelling of the face, lips, or tongue -breast lumps -changes in emotions or moods -depressed mood -heavy or prolonged menstrual bleeding -pain, irritation, swelling, or bruising at the insertion site -scar at site of insertion -signs of infection at the insertion site such as fever, and skin redness, pain or discharge -signs of pregnancy -signs and symptoms of a blood clot such as breathing problems; changes in vision; chest pain; severe, sudden headache; pain, swelling, warmth in the leg; trouble speaking; sudden numbness or weakness of the face, arm or leg -signs and symptoms of liver injury like dark yellow or brown urine; general ill feeling or flu-like symptoms;  light-colored   stools; loss of appetite; nausea; right upper belly pain; unusually weak or tired; yellowing of the eyes or skin -unusual vaginal bleeding, discharge -signs and symptoms of a stroke like changes in vision; confusion; trouble speaking or understanding; severe headaches; sudden numbness or weakness of the face, arm or leg; trouble walking; dizziness; loss of balance or coordination Side effects that usually do not require medical attention (report to your doctor or health care professional if they continue or are bothersome): -acne -back pain -breast pain -changes in weight -dizziness -general ill feeling or flu-like symptoms -headache -irregular menstrual bleeding -nausea -sore throat -vaginal irritation or inflammation This list may not describe all possible side effects. Call your doctor for medical advice about side effects. You may report side effects to FDA at 1-800-FDA-1088. Where should I keep my medicine? This drug is given in a hospital or clinic and will not be stored at home. NOTE: This sheet is a summary. It may not cover all possible information. If you have questions about this medicine, talk to your doctor, pharmacist, or health care provider.  2018 Elsevier/Gold Standard (2016-06-13 11:19:22) Intrauterine Device Information An intrauterine device (IUD) is inserted into your uterus to prevent pregnancy. There are two types of IUDs available:  Copper IUD-This type of IUD is wrapped in copper wire and is placed inside the uterus. Copper makes the uterus and fallopian tubes produce a fluid that kills sperm. The copper IUD can stay in place for 10 years.  Hormone IUD-This type of IUD contains the hormone progestin (synthetic progesterone). The hormone thickens the cervical mucus and prevents sperm from entering the uterus. It also thins the uterine lining to prevent implantation of a fertilized egg. The hormone can weaken or kill the sperm that get into the uterus.  One type of hormone IUD can stay in place for 5 years, and another type can stay in place for 3 years.  Your health care provider will make sure you are a good candidate for a contraceptive IUD. Discuss with your health care provider the possible side effects. Advantages of an intrauterine device  IUDs are highly effective, reversible, long acting, and low maintenance.  There are no estrogen-related side effects.  An IUD can be used when breastfeeding.  IUDs are not associated with weight gain.  The copper IUD works immediately after insertion.  The hormone IUD works right away if inserted within 7 days of your period starting. You will need to use a backup method of birth control for 7 days if the hormone IUD is inserted at any other time in your cycle.  The copper IUD does not interfere with your female hormones.  The hormone IUD can make heavy menstrual periods lighter and decrease cramping.  The hormone IUD can be used for 3 or 5 years.  The copper IUD can be used for 10 years. Disadvantages of an intrauterine device  The hormone IUD can be associated with irregular bleeding patterns.  The copper IUD can make your menstrual flow heavier and more painful.  You may experience cramping and vaginal bleeding after insertion. This information is not intended to replace advice given to you by your health care provider. Make sure you discuss any questions you have with your health care provider. Document Released: 10/29/2004 Document Revised: 05/02/2016 Document Reviewed: 05/16/2013 Elsevier Interactive Patient Education  2017 Elsevier Inc.  

## 2018-10-07 NOTE — Progress Notes (Signed)
GYNECOLOGY OFFICE FOLLOW UP NOTE  History:  32 y.o. [id number] here today for follow up for ovarian cysts. No current pain, feels pain on left when she pushes on her abdomen. Has painful periods since she has had ovarian cyst. Denies irregular periods, vaginal discharge. Has pain with intercourse. Not using anything for birth control, desires something. Desires STI scren, has not had pap in a long time, thinks she had an abnormal pap at some point but denies any surgery. Does not have PCP, got anti-hypertensives from Urgent Care. Last took anti-hypertensives with delivery of last child ~ 41yrs ago.  Past Medical History:  Diagnosis Date  . Bacterial vaginosis   . Ovarian cyst   . Pregnancy induced hypertension    Past Surgical History:  Procedure Laterality Date  . DILATION AND EVACUATION N/A 02/21/2013   Procedure: DILATATION AND EVACUATION;  Surgeon: Reva Bores, MD;  Location: [facility];  Service: Gynecology;  Laterality: N/A;    Current Outpatient Medications:  .  amLODipine (NORVASC) 2.5 MG tablet, Take 1 tablet (2.5 mg total) by mouth daily., Disp: 30 tablet, Rfl: 1 .  hydrochlorothiazide (HYDRODIURIL) 50 MG tablet, Take 1 tablet (50 mg total) by mouth daily., Disp: 30 tablet, Rfl: 1 .  naproxen (NAPROSYN) 500 MG tablet, Take 1 tablet (500 mg total) by mouth 2 (two) times daily., Disp: 14 tablet, Rfl: 0 .  norethindrone (MICRONOR,CAMILA,ERRIN) 0.35 MG tablet, Take 1 tablet (0.35 mg total) by mouth daily., Disp: 1 Package, Rfl: 11  The following portions of the patient's history were reviewed and updated as appropriate: allergies, current medications, past family history, past medical history, past social history, past surgical history and problem list.   Review of Systems:  Pertinent items noted in HPI and remainder of comprehensive ROS otherwise negative.   Objective:  Physical Exam BP (!) 146/116   Pulse 84   Ht 5\' 7"  (1.702 m)   Wt 118 lb 1.6 oz (53.6 kg)   LMP 09/18/2018    BMI 18.50 kg/m  CONSTITUTIONAL: Well-developed, well-nourished female in no acute distress. Appears thin. HENT:  Normocephalic, atraumatic. External right and left ear normal. Oropharynx is clear and moist EYES: Conjunctivae and EOM are normal. Pupils are equal, round, and reactive to light. No scleral icterus.  NECK: Normal range of motion, supple, no masses SKIN: Skin is warm and dry. No rash noted. Not diaphoretic. No erythema. No pallor. NEUROLOGIC: Alert and oriented to person, place, and time. Normal reflexes, muscle tone coordination. No cranial nerve deficit noted. PSYCHIATRIC: Normal mood and affect. Normal behavior. Normal judgment and thought content. CARDIOVASCULAR: Normal heart rate noted RESPIRATORY: Effort normal, no problems with respiration noted ABDOMEN: Soft, no distention noted.   PELVIC: Normal appearing external genitalia; normal appearing vaginal mucosa and cervix.  No abnormal discharge noted.  Pap smear obtained.  pelvic cultures obtained. Normal uterine size, small mildly tender mobile mass palpated in left adnexa, no other palpable masses MUSCULOSKELETAL: Normal range of motion. No edema noted.  Labs and Imaging No results found.  Assessment & Plan:   1. Chronic hypertension Got HCTZ, norvasc from Urgent Care Previously on meds with prior pregnancy - Ambulatory referral to [facility]  2. Other depression - Ambulatory referral to Integrated Behavioral [facility]  3. Weight loss > 20 pound weight loss since summer H/o thyroid problems  4. Thyroid disease Had issues as a child, not sure hyper/hypo, has never been on meds - TSH - T4, free  5. Pelvic pain None currently  but present with palpation - US PELVIC COMPLETE WITH TRANSVAGINAL; Future  6. Cyst of left ovary  7. Pap smear for cervical cancer screening - Cytology - PAP( [place])  8. Routine screening for STI (sexually transmitted infection) GC/CT - HIV Antibody (routine testing w  rflx) - Hepatitis C Antibody - Hepatitis B surface antigen - RPR  9. Encounter for initial prescription of contraceptive pills Desires pills, reviewed risks with [facility], sent micronor Gave info for nexplanon/IUD   Routine preventative health maintenance measures emphasized. Please refer to After Visit Summary for other counseling recommendations.   Return in about 1 year (around 10/08/2019) for annual.    Baldemar Lenis, M.D. [facility]

## 2018-10-07 NOTE — Progress Notes (Signed)
Here for fu pelvic pain, seen in Urgent care. C/o not having an appetite. States has lost weight. Hx thyroid issues.  Referral made to [facility].- no pcp for CHTN. BP elevated today, states did not take medicine today, but takes it every day.  Referral made to [facility]  Elevated phq9 - offered to see [facility]  On another day because Asher Muir not here. She will make appt at checkout.

## 2018-10-08 ENCOUNTER — Institutional Professional Consult (permissible substitution): Payer: Medicaid Other

## 2018-10-08 LAB — T4, FREE: Free T4: 1.17 ng/dL (ref 0.82–1.77)

## 2018-10-08 LAB — TSH: TSH: 0.403 u[IU]/mL — ABNORMAL LOW (ref 0.450–4.500)

## 2018-10-08 LAB — HEPATITIS B SURFACE ANTIGEN: Hepatitis B Surface Ag: NEGATIVE

## 2018-10-08 LAB — HEPATITIS C ANTIBODY: Hep C Virus Ab: <0.1 s/co ratio (ref 0.0–0.9)

## 2018-10-08 LAB — HIV ANTIBODY (ROUTINE TESTING W REFLEX): HIV Screen 4th Generation wRfx: NONREACTIVE

## 2018-10-08 LAB — RPR: RPR Ser Ql: NONREACTIVE

## 2018-10-08 NOTE — BH Specialist Note (Deleted)
Integrated Behavioral Health Initial Visit  MRN: [id number] Name: Angela Manning  Number of Integrated Behavioral Health Clinician visits:: {IBH Number of Visits:21014052} Session Start time: ***  Session End time: *** Total time: {IBH Total Time:21014050}  Type of Service: Integrated Behavioral Health- Individual/Family Interpretor:{yes WU:[id number]} Interpretor Name and Language: ***   Warm Hand Off Completed.       SUBJECTIVE: Angela Manning is a 32 y.o. female accompanied by {CHL AMB ACCOMPANIED YN:8295621308} Patient was referred by *** for ***. Patient reports the following symptoms/concerns: *** Duration of problem: ***; Severity of problem: {Mild/Moderate/Severe:20260}  OBJECTIVE: Mood: {BHH MOOD:22306} and Affect: {BHH AFFECT:22307} Risk of harm to self or others: {CHL AMB BH Suicide Current Mental Status:21022748}  LIFE CONTEXT: Family and Social: *** School/Work: *** Self-Care: *** Life Changes: ***  GOALS ADDRESSED: Patient will: 1. Reduce symptoms of: {IBH Symptoms:21014056} 2. Increase knowledge and/or ability of: {IBH Patient Tools:21014057}  3. Demonstrate ability to: {IBH Goals:21014053}  INTERVENTIONS: Interventions utilized: {IBH Interventions:21014054}  Standardized Assessments completed: {IBH Screening Tools:21014051}  ASSESSMENT: Patient currently experiencing ***.   Patient may benefit from ***.  PLAN: 1. Follow up with behavioral health clinician on : *** 2. Behavioral recommendations: *** 3. Referral(s): {IBH Referrals:21014055} 4. "From scale of 1-10, how likely are you to follow plan?": ***  Valetta Close McMannes, LCSW  ***

## 2018-10-09 LAB — CYTOLOGY - PAP
Chlamydia: NEGATIVE
Diagnosis: HIGH — AB
HPV: DETECTED — AB
Neisseria Gonorrhea: NEGATIVE

## 2018-10-12 ENCOUNTER — Telehealth: Payer: Self-pay | Admitting: *Deleted

## 2018-10-12 ENCOUNTER — Ambulatory Visit (HOSPITAL_COMMUNITY)
Admission: RE | Admit: 2018-10-12 | Discharge: 2018-10-12 | Disposition: A | Discharge status: Home / Self Care | Payer: Medicaid Other | Source: Ambulatory Visit | Attending: Obstetrics and Gynecology | Admitting: Obstetrics and Gynecology

## 2018-10-12 DIAGNOSIS — R102 Pelvic and perineal pain: Secondary | ICD-10-CM | POA: Insufficient documentation

## 2018-10-12 NOTE — Telephone Encounter (Signed)
Called pt to inform her of her need for a colposcopy due to an abnormal pap smear.  Explained a colposcopy to the pt and what a biopsy is.  Pt verbalized understanding.  Will send to the front office staff to schedule her appointment.

## 2018-10-12 NOTE — Telephone Encounter (Signed)
-----   Message from Conan Bowens, MD sent at 10/12/2018  1:34 PM EST ----- Please call patient and schedule her for colposcopy.

## 2018-10-14 ENCOUNTER — Telehealth: Payer: Self-pay | Admitting: General Practice

## 2018-10-14 NOTE — Telephone Encounter (Signed)
Patient called and left message requesting ultrasound results. Called & informed patient of normal ultrasound results. Patient verbalized understanding & had no questions.

## 2018-11-04 ENCOUNTER — Other Ambulatory Visit: Payer: Self-pay

## 2018-11-04 ENCOUNTER — Encounter (HOSPITAL_COMMUNITY): Payer: Self-pay

## 2018-11-04 ENCOUNTER — Ambulatory Visit (HOSPITAL_COMMUNITY)
Admission: EM | Admit: 2018-11-04 | Discharge: 2018-11-04 | Disposition: A | Discharge status: Home / Self Care | Payer: Medicaid Other | Attending: Family Medicine | Admitting: Family Medicine

## 2018-11-04 DIAGNOSIS — N760 Acute vaginitis: Secondary | ICD-10-CM | POA: Diagnosis not present

## 2018-11-04 DIAGNOSIS — N898 Other specified noninflammatory disorders of vagina: Secondary | ICD-10-CM

## 2018-11-04 DIAGNOSIS — B9689 Other specified bacterial agents as the cause of diseases classified elsewhere: Secondary | ICD-10-CM

## 2018-11-04 MED ORDER — CLINDAMYCIN HCL 300 MG PO CAPS: 300.0000 mg | ORAL_CAPSULE | Freq: Two times a day (BID) | ORAL | 0 refills | Status: DC

## 2018-11-04 NOTE — ED Triage Notes (Signed)
Pt states she has BV she gets this a lot. This just started today. Pt states she just need the prescription.

## 2018-11-04 NOTE — ED Provider Notes (Signed)
[facility]   [id number] 11/04/18 Arrival Time: [id number]  ASSESSMENT & PLAN:  1. BV (bacterial vaginosis)   2. Vaginal odor    Declines testing/treatment for gonorrhea/chlamydia. She is sure of BV.  Meds ordered this encounter  Medications  . clindamycin (CLEOCIN) 300 MG capsule    Sig: Take 1 capsule (300 mg total) by mouth 2 (two) times daily.    Dispense:  14 capsule    Refill:  0   May f/u as needed. Reviewed expectations re: course of current medical issues. Questions answered. Outlined signs and symptoms indicating need for more acute intervention. Patient verbalized understanding. After Visit Summary given.   SUBJECTIVE:  Angela Manning is a 32 y.o. female who presents with complaint of vaginal discharge. Onset abrupt, tdoay. Describes discharge as thin with and odor; "smells like my BV". Urinary symptoms: none. Afebrile. No abdominal or pelvic pain. No n/v. No rashes or lesions. Sexually active with single female partner. OTC treatment: none. Usually treated with clindamycin for BV.  Patient's last menstrual period was 10/16/2018.  ROS: As per HPI.  OBJECTIVE:  Vitals:   11/04/18 1029 11/04/18 1032  BP: (!) 128/92   Pulse: 71   Resp: 16   Temp: 98.4 F (36.9 C)   TempSrc: Oral   SpO2: 100%   Weight:  58.1 kg     General appearance: alert, cooperative, appears stated age and no distress Throat: lips, mucosa, and tongue normal; teeth and gums normal Cv: RRR Lungs: CTAB Back: no CVA tenderness; FROM at waist Abdomen: soft, non-tender GU: declined Skin: warm and dry Psychological: alert and cooperative; normal mood and affect.   Labs Reviewed - No data to display  Allergies  Allergen Reactions  . Flagyl [Metronidazole] Rash    "Breaks out in blisters"    Past Medical History:  Diagnosis Date  . Bacterial vaginosis   . Ovarian cyst   . Pregnancy induced hypertension    Family History  Problem Relation Age of Onset  . Hypertension  Mother    Social History   Socioeconomic History  . Marital status: Single    Spouse name: Not on file  . Number of children: Not on file  . Years of education: Not on file  . Highest education level: Not on file  Occupational History  . Not on file  Social Needs  . Financial resource strain: Not on file  . Food insecurity:    Worry: Not on file    Inability: Not on file  . Transportation needs:    Medical: Not on file    Non-medical: Not on file  Tobacco Use  . Smoking status: Former Smoker    Packs/day: 0.25    Types: Cigarettes  . Smokeless tobacco: Former NeurosurgeonUser    Quit date: 01/08/2015  Substance and Sexual Activity  . Alcohol use: Yes    Alcohol/week: 0.0 standard drinks    Comment: occasionally  . Drug use: No  . Sexual activity: Yes    Partners: Male    Birth control/protection: None  Lifestyle  . Physical activity:    Days per week: Not on file    Minutes per session: Not on file  . Stress: Not on file  Relationships  . Social connections:    Talks on phone: Not on file    Gets together: Not on file    Attends religious service: Not on file    Active member of club or organization: Not on file  Attends meetings of clubs or organizations: Not on file    Relationship status: Not on file  . Intimate partner violence:    Fear of current or ex partner: Not on file    Emotionally abused: Not on file    Physically abused: Not on file    Forced sexual activity: Not on file  Other Topics Concern  . Not on file  Social History Narrative  . Not on file          [name], MD 11/10/18 1010

## 2018-11-25 ENCOUNTER — Ambulatory Visit: Payer: Medicaid Other | Admitting: Obstetrics and Gynecology

## 2018-11-29 ENCOUNTER — Ambulatory Visit (HOSPITAL_COMMUNITY)
Admission: EM | Admit: 2018-11-29 | Discharge: 2018-11-29 | Disposition: A | Discharge status: Home / Self Care | Payer: Medicaid Other | Attending: Internal Medicine | Admitting: Internal Medicine

## 2018-11-29 ENCOUNTER — Encounter (HOSPITAL_COMMUNITY): Payer: Self-pay | Admitting: *Deleted

## 2018-11-29 DIAGNOSIS — Z202 Contact with and (suspected) exposure to infections with a predominantly sexual mode of transmission: Secondary | ICD-10-CM

## 2018-11-29 DIAGNOSIS — Z87891 Personal history of nicotine dependence: Secondary | ICD-10-CM | POA: Diagnosis not present

## 2018-11-29 DIAGNOSIS — Z881 Allergy status to other antibiotic agents status: Secondary | ICD-10-CM | POA: Insufficient documentation

## 2018-11-29 DIAGNOSIS — Z79899 Other long term (current) drug therapy: Secondary | ICD-10-CM | POA: Insufficient documentation

## 2018-11-29 DIAGNOSIS — Z113 Encounter for screening for infections with a predominantly sexual mode of transmission: Secondary | ICD-10-CM

## 2018-11-29 MED ORDER — AZITHROMYCIN 250 MG PO TABS
ORAL_TABLET | ORAL | Status: AC
  Filled 2018-11-29: qty 4

## 2018-11-29 MED ORDER — CEFTRIAXONE SODIUM 250 MG IJ SOLR
250.0000 mg | Freq: Once | INTRAMUSCULAR | Status: AC
  Administered 2018-11-29: 250 mg via INTRAMUSCULAR

## 2018-11-29 MED ORDER — CEFTRIAXONE SODIUM 250 MG IJ SOLR
INTRAMUSCULAR | Status: AC
  Filled 2018-11-29: qty 250

## 2018-11-29 MED ORDER — AZITHROMYCIN 250 MG PO TABS
1000.0000 mg | ORAL_TABLET | Freq: Once | ORAL | Status: AC
  Administered 2018-11-29: 1000 mg via ORAL

## 2018-11-29 MED ORDER — LIDOCAINE HCL (PF) 1 % IJ SOLN
INTRAMUSCULAR | Status: AC
  Filled 2018-11-29: qty 2

## 2018-11-29 NOTE — Discharge Instructions (Signed)
Cytology sent, you will be contacted with any positive results that requires further treatment. Refrain from sexual activity for the next 7 days. Monitor for any worsening of symptoms, fever, abdominal pain, nausea, vomiting, to follow up for reevaluation. ° °

## 2018-11-29 NOTE — ED Provider Notes (Addendum)
[facility]    CSN: [id number] Arrival date & time: 11/29/18  1122     History   Chief Complaint Chief Complaint  Patient presents with  . Exposure to STD    HPI Angela Manning is a 32 y.o. female.   32 year old female comes in for possible STD exposure.  Patient states found out her partner has multiple partners and he is now having some symptoms. She is asymptomatic.  Denies fever, chills, night sweats.  Denies abdominal pain, nausea, vomiting.  Denies urinary symptoms such as frequency, dysuria, hematuria.  Denies vaginal discharge, itching, pain.  LMP 11/11/2018.  On OCPs.     Past Medical History:  Diagnosis Date  . Bacterial vaginosis   . Ovarian cyst   . Pregnancy induced hypertension     Patient Active Problem List   Diagnosis Date Noted  . Nausea 05/09/2017  . Elevated blood pressure reading 05/09/2017  . Elevated serum hCG 03/12/2016  . Ovarian cyst, left 03/01/2015  . Abnormal uterine bleeding (AUB) 03/01/2015  . Dyspareunia 03/01/2015  . Smoker 10/23/2014  . Retained products of conception with hemorrhage 02/21/2013    Past Surgical History:  Procedure Laterality Date  . DILATION AND EVACUATION N/A 02/21/2013   Procedure: DILATATION AND EVACUATION;  Surgeon: Reva Boresanya S Pratt, MD;  Location: [facility];  Service: Gynecology;  Laterality: N/A;    OB History    Gravida  4   Para  4   Term  3   Preterm  1   AB      Living  4     SAB      TAB      Ectopic      Multiple      Live Births  4            Home Medications    Prior to Admission medications   Medication Sig Start Date End Date Taking? Authorizing Provider  amLODipine (NORVASC) 2.5 MG tablet Take 1 tablet (2.5 mg total) by mouth daily. 09/24/18  Yes Bast, Traci A, NP  hydrochlorothiazide (HYDRODIURIL) 50 MG tablet Take 1 tablet (50 mg total) by mouth daily. 09/24/18  Yes Bast, Traci A, NP  norethindrone (MICRONOR,CAMILA,ERRIN) 0.35 MG tablet Take 1 tablet (0.35  mg total) by mouth daily. 10/07/18  Yes Conan Bowensavis, Kelly M, MD    Family History Family History  Problem Relation Age of Onset  . Hypertension Mother     Social History Social History   Tobacco Use  . Smoking status: Former Smoker    Packs/day: 0.25    Types: Cigarettes  . Smokeless tobacco: Former NeurosurgeonUser    Quit date: 01/08/2015  Substance Use Topics  . Alcohol use: Yes    Alcohol/week: 0.0 standard drinks    Comment: occasionally  . Drug use: No     Allergies   Flagyl [metronidazole]   Review of Systems Review of Systems  Reason unable to perform ROS: See HPI as above.     Physical Exam Triage Vital Signs ED Triage Vitals  Enc Vitals Group     BP 11/29/18 1235 (!) 167/116     Pulse Rate 11/29/18 1235 69     Resp 11/29/18 1235 16     Temp 11/29/18 1235 98.2 F (36.8 C)     Temp Source 11/29/18 1235 Oral     SpO2 11/29/18 1235 100 %     Weight --      Height --  Head Circumference --      Peak Flow --      Pain Score 11/29/18 1236 0     Pain Loc --      Pain Edu? --      Excl. in GC? --    No data found.  Updated Vital Signs BP (!) 167/116 Comment: has not taken HTN med today; pt taking both HTN meds while in triage.  Pulse 69   Temp 98.2 F (36.8 C) (Oral)   Resp 16   LMP 11/11/2018 (Exact Date)   SpO2 100%   Physical Exam Constitutional:      General: She is not in acute distress.    Appearance: She is well-developed.  HENT:     Head: Normocephalic and atraumatic.  Eyes:     Conjunctiva/sclera: Conjunctivae normal.     Pupils: Pupils are equal, round, and reactive to light.  Cardiovascular:     Rate and Rhythm: Normal rate and regular rhythm.     Heart sounds: Normal heart sounds. No murmur. No friction rub. No gallop.   Pulmonary:     Effort: Pulmonary effort is normal.     Breath sounds: Normal breath sounds. No wheezing or rales.  Abdominal:     General: Bowel sounds are normal.     Palpations: Abdomen is soft. There is no mass.      Tenderness: There is no abdominal tenderness. There is no guarding or rebound.  Skin:    General: Skin is warm and dry.  Neurological:     Mental Status: She is alert and oriented to person, place, and time.  Psychiatric:        Behavior: Behavior normal.        Judgment: Judgment normal.      UC Treatments / Results  Labs (all labs ordered are listed, but only abnormal results are displayed) Labs Reviewed  CERVICOVAGINAL ANCILLARY ONLY    EKG None  Radiology No results found.  Procedures Procedures (including critical care time)  Medications Ordered in UC Medications - No data to display  Initial Impression / Assessment and Plan / UC Course  I have reviewed the triage vital signs and the nursing notes.  Pertinent labs & imaging results that were available during my care of the patient were reviewed by me and considered in my medical decision making (see chart for details).    Cytology sent, patient will be contacted with any positive results that require additional treatment. Patient to refrain from sexual activity for the next 7 days. Return precautions given.   Patient requesting to get empiric treatment as her partner was treated. Will cover for GC with azithromycin and rocephin.   Final Clinical Impressions(s) / UC Diagnoses   Final diagnoses:  Possible exposure to STD    ED Prescriptions    None        Lurline IdolYu, Amy V, PA-C 11/29/18 1319    [address]Yu, Amy V, PA-C 11/29/18 1321

## 2018-11-29 NOTE — ED Triage Notes (Signed)
Pt reports sexual partner possibly having STD sxs.  Pt states she has no sxs, but wishes to be tested.

## 2018-11-30 LAB — CERVICOVAGINAL ANCILLARY ONLY
Chlamydia: NEGATIVE
Neisseria Gonorrhea: NEGATIVE
Trichomonas: NEGATIVE

## 2019-01-21 ENCOUNTER — Encounter (HOSPITAL_COMMUNITY): Payer: Self-pay | Admitting: Family Medicine

## 2019-01-21 ENCOUNTER — Ambulatory Visit (HOSPITAL_COMMUNITY)
Admission: EM | Admit: 2019-01-21 | Discharge: 2019-01-21 | Disposition: A | Discharge status: Home / Self Care | Payer: Medicaid Other | Attending: Family Medicine | Admitting: Family Medicine

## 2019-01-21 DIAGNOSIS — B9689 Other specified bacterial agents as the cause of diseases classified elsewhere: Secondary | ICD-10-CM | POA: Diagnosis not present

## 2019-01-21 DIAGNOSIS — N760 Acute vaginitis: Secondary | ICD-10-CM

## 2019-01-21 MED ORDER — CLINDAMYCIN HCL 150 MG PO CAPS: 150.0000 mg | ORAL_CAPSULE | Freq: Four times a day (QID) | ORAL | 0 refills | Status: DC

## 2019-01-21 NOTE — ED Provider Notes (Signed)
[facility]    CSN: [id number] Arrival date & time: 01/21/19  1412     History   Chief Complaint Chief Complaint  Patient presents with  . Vaginal Discharge    HPI Angela Manning is a 33 y.o. female.   This is a 33 year old woman with a vaginal discharge.  This started last night.  It has a odor similar to what she has had in the past with bacterial vaginitis.  She is allergic to metronidazole and would like clindamycin.  She has no abdominal pain or fever.  Patient has a past medical history of hypertension.  Gravida 4 para 4.  Last menstrual period was at the end of January.  Patient is not sexually active.     Past Medical History:  Diagnosis Date  . Bacterial vaginosis   . Ovarian cyst   . Pregnancy induced hypertension     Patient Active Problem List   Diagnosis Date Noted  . Nausea 05/09/2017  . Elevated blood pressure reading 05/09/2017  . Elevated serum hCG 03/12/2016  . Ovarian cyst, left 03/01/2015  . Abnormal uterine bleeding (AUB) 03/01/2015  . Dyspareunia 03/01/2015  . Smoker 10/23/2014  . Retained products of conception with hemorrhage 02/21/2013    Past Surgical History:  Procedure Laterality Date  . DILATION AND EVACUATION N/A 02/21/2013   Procedure: DILATATION AND EVACUATION;  Surgeon: Reva Bores, MD;  Location: [facility];  Service: Gynecology;  Laterality: N/A;    OB History    Gravida  4   Para  4   Term  3   Preterm  1   AB      Living  4     SAB      TAB      Ectopic      Multiple      Live Births  4            Home Medications    Prior to Admission medications   Medication Sig Start Date End Date Taking? Authorizing Provider  amLODipine (NORVASC) 2.5 MG tablet Take 1 tablet (2.5 mg total) by mouth daily. 09/24/18  Yes Bast, Traci A, NP  hydrochlorothiazide (HYDRODIURIL) 50 MG tablet Take 1 tablet (50 mg total) by mouth daily. 09/24/18  Yes Bast, Traci A, NP  clindamycin (CLEOCIN) 150 MG capsule  Take 1 capsule (150 mg total) by mouth every 6 (six) hours. 01/21/19   Elvina Sidle, MD  norethindrone (MICRONOR,CAMILA,ERRIN) 0.35 MG tablet Take 1 tablet (0.35 mg total) by mouth daily. 10/07/18   Conan Bowens, MD    Family History Family History  Problem Relation Age of Onset  . Hypertension Mother     Social History Social History   Tobacco Use  . Smoking status: Former Smoker    Packs/day: 0.25    Types: Cigarettes  . Smokeless tobacco: Former Neurosurgeon    Quit date: 01/08/2015  Substance Use Topics  . Alcohol use: Yes    Alcohol/week: 0.0 standard drinks    Comment: occasionally  . Drug use: No     Allergies   Flagyl [metronidazole]   Review of Systems Review of Systems   Physical Exam Triage Vital Signs ED Triage Vitals [01/21/19 1527]  Enc Vitals Group     BP (S) (!) 162/109     Pulse Rate 74     Resp 16     Temp 98 F (36.7 C)     Temp Source Oral     SpO2  100 %     Weight      Height      Head Circumference      Peak Flow      Pain Score      Pain Loc      Pain Edu?      Excl. in [place]?    No data found.  Updated Vital Signs BP (S) (!) 162/109 (BP Location: Left Arm) Comment: has not had BP meds x3 days  Pulse 74   Temp 98 F (36.7 C) (Oral)   Resp 16   LMP 01/04/2019   SpO2 100%    Physical Exam Vitals signs and nursing note reviewed.  Constitutional:      Appearance: Normal appearance.  HENT:     Head: Normocephalic.     Right Ear: External ear normal.     Left Ear: External ear normal.     Nose: Nose normal.     Mouth/Throat:     Pharynx: Oropharynx is clear.  Eyes:     Extraocular Movements: Extraocular movements intact.  Neck:     Musculoskeletal: Normal range of motion and neck supple.  Pulmonary:     Effort: Pulmonary effort is normal.  Musculoskeletal: Normal range of motion.  Skin:    General: Skin is warm and dry.  Neurological:     General: No focal deficit present.     Mental Status: She is alert.    Psychiatric:        Mood and Affect: Mood normal.        Behavior: Behavior normal.      UC Treatments / Results  Labs (all labs ordered are listed, but only abnormal results are displayed) Labs Reviewed - No data to display  EKG None  Radiology No results found.  Procedures Procedures (including critical care time)  Medications Ordered in UC Medications - No data to display  Initial Impression / Assessment and Plan / UC Course  I have reviewed the triage vital signs and the nursing notes.  Pertinent labs & imaging results that were available during my care of the patient were reviewed by me and considered in my medical decision making (see chart for details).    Final Clinical Impressions(s) / UC Diagnoses   Final diagnoses:  BV (bacterial vaginosis)   Discharge Instructions   None    ED Prescriptions    Medication Sig Dispense Auth. Provider   clindamycin (CLEOCIN) 150 MG capsule Take 1 capsule (150 mg total) by mouth every 6 (six) hours. 28 capsule Elvina Sidle, MD     Controlled Substance Prescriptions [place] Controlled Substance Registry consulted? Not Applicable   Elvina Sidle, MD 01/21/19 1535

## 2019-01-21 NOTE — ED Triage Notes (Signed)
PT C/O: poss BV associated w/foul smell onset yest  DENIES: vag d/c  TAKING MEDS: none   A&O x4... NAD... Ambulatory

## 2019-04-27 ENCOUNTER — Encounter: Payer: Self-pay | Admitting: Obstetrics and Gynecology

## 2019-08-23 ENCOUNTER — Other Ambulatory Visit: Payer: Self-pay

## 2019-08-23 ENCOUNTER — Encounter (HOSPITAL_COMMUNITY): Payer: Self-pay | Admitting: *Deleted

## 2019-08-23 ENCOUNTER — Emergency Department (HOSPITAL_COMMUNITY): Payer: Medicaid Other

## 2019-08-23 ENCOUNTER — Emergency Department (HOSPITAL_COMMUNITY)
Admission: EM | Admit: 2019-08-23 | Discharge: 2019-08-23 | Disposition: A | Discharge status: Home / Self Care | Payer: Medicaid Other | Attending: Emergency Medicine | Admitting: Emergency Medicine

## 2019-08-23 DIAGNOSIS — Z79899 Other long term (current) drug therapy: Secondary | ICD-10-CM | POA: Diagnosis not present

## 2019-08-23 DIAGNOSIS — Z87891 Personal history of nicotine dependence: Secondary | ICD-10-CM | POA: Diagnosis not present

## 2019-08-23 DIAGNOSIS — N83202 Unspecified ovarian cyst, left side: Secondary | ICD-10-CM | POA: Diagnosis not present

## 2019-08-23 DIAGNOSIS — I10 Essential (primary) hypertension: Secondary | ICD-10-CM | POA: Insufficient documentation

## 2019-08-23 DIAGNOSIS — R102 Pelvic and perineal pain: Secondary | ICD-10-CM | POA: Diagnosis not present

## 2019-08-23 DIAGNOSIS — R1032 Left lower quadrant pain: Secondary | ICD-10-CM | POA: Diagnosis present

## 2019-08-23 LAB — COMPREHENSIVE METABOLIC PANEL
ALT: 12 U/L (ref 0–44)
AST: 19 U/L (ref 15–41)
Albumin: 4.1 g/dL (ref 3.5–5.0)
Alkaline Phosphatase: 42 U/L (ref 38–126)
Anion gap: 8 (ref 5–15)
BUN: 14 mg/dL (ref 6–20)
CO2: 25 mmol/L (ref 22–32)
Calcium: 9.6 mg/dL (ref 8.9–10.3)
Chloride: 106 mmol/L (ref 98–111)
Creatinine, Ser: 0.8 mg/dL (ref 0.44–1.00)
GFR calc Af Amer: >60 mL/min (ref >60)
GFR calc non Af Amer: >60 mL/min (ref >60)
Glucose, Bld: 104 mg/dL — ABNORMAL HIGH (ref 70–99)
Potassium: 3.7 mmol/L (ref 3.5–5.1)
Sodium: 139 mmol/L (ref 135–145)
Total Bilirubin: 0.7 mg/dL (ref 0.3–1.2)
Total Protein: 7.2 g/dL (ref 6.5–8.1)

## 2019-08-23 LAB — URINALYSIS, ROUTINE W REFLEX MICROSCOPIC
Bilirubin Urine: NEGATIVE
Glucose, UA: NEGATIVE mg/dL
Hgb urine dipstick: NEGATIVE
Ketones, ur: NEGATIVE mg/dL
Nitrite: NEGATIVE
Protein, ur: NEGATIVE mg/dL
Specific Gravity, Urine: 1.027 (ref 1.005–1.030)
pH: 5 (ref 5.0–8.0)

## 2019-08-23 LAB — WET PREP, GENITAL
Clue Cells Wet Prep HPF POC: NONE SEEN
Sperm: NONE SEEN
Trich, Wet Prep: NONE SEEN
Yeast Wet Prep HPF POC: NONE SEEN

## 2019-08-23 LAB — CBC
HCT: 41.8 % (ref 36.0–46.0)
Hemoglobin: 13.2 g/dL (ref 12.0–15.0)
MCH: 27 pg (ref 26.0–34.0)
MCHC: 31.6 g/dL (ref 30.0–36.0)
MCV: 85.7 fL (ref 80.0–100.0)
Platelets: 221 10*3/uL (ref 150–400)
RBC: 4.88 MIL/uL (ref 3.87–5.11)
RDW: 15.4 % (ref 11.5–15.5)
WBC: 4.9 10*3/uL (ref 4.0–10.5)
nRBC: 0 % (ref 0.0–0.2)

## 2019-08-23 LAB — I-STAT BETA HCG BLOOD, ED (MC, WL, AP ONLY): I-stat hCG, quantitative: <5 m[IU]/mL (ref <5)

## 2019-08-23 LAB — LIPASE, BLOOD: Lipase: 32 U/L (ref 11–51)

## 2019-08-23 MED ORDER — AMLODIPINE BESYLATE 2.5 MG PO TABS: 2.5000 mg | ORAL_TABLET | Freq: Every day | ORAL | 0 refills | Status: DC

## 2019-08-23 MED ORDER — AMLODIPINE BESYLATE 5 MG PO TABS
2.5000 mg | ORAL_TABLET | Freq: Once | ORAL | Status: AC
  Administered 2019-08-23: 06:00:00 2.5 mg via ORAL
  Filled 2019-08-23: qty 1

## 2019-08-23 MED ORDER — HYDROCHLOROTHIAZIDE 50 MG PO TABS
50.0000 mg | ORAL_TABLET | Freq: Once | ORAL | Status: AC
  Administered 2019-08-23: 50 mg via ORAL
  Filled 2019-08-23: qty 2
  Filled 2019-08-23: qty 1

## 2019-08-23 MED ORDER — HYDROCHLOROTHIAZIDE 50 MG PO TABS: 50.0000 mg | ORAL_TABLET | Freq: Every day | ORAL | 0 refills | Status: DC

## 2019-08-23 MED ORDER — NAPROXEN 250 MG PO TABS
500.0000 mg | ORAL_TABLET | Freq: Once | ORAL | Status: AC
  Administered 2019-08-23: 500 mg via ORAL
  Filled 2019-08-23: qty 2

## 2019-08-23 MED ORDER — SODIUM CHLORIDE 0.9% FLUSH: 3.0000 mL | Freq: Once | INTRAVENOUS | Status: DC

## 2019-08-23 NOTE — ED Triage Notes (Signed)
The pt has had llq pain for 2 days no nv or diarrhea  shr rhinks she has a cyst on her ovaries as in the past  lmp  Aug 17th

## 2019-08-23 NOTE — ED Notes (Signed)
Pt given dc instructions pt verbalizes understanding.  

## 2019-08-23 NOTE — Discharge Instructions (Addendum)
Please follow up with [facility] regarding your ovarian cyst/pelvic pain. Take Ibuprofen/Tylenol as needed.   Pick up prescriptions for blood pressure and follow up with [facility] for further primary care needs.   Return to the ED for any worsening symptoms including worsening pain, vomiting, fevers > 100.4.

## 2019-08-23 NOTE — ED Provider Notes (Signed)
[facility] EMERGENCY DEPARTMENT Provider Note   CSN: [id number] Arrival date & time: 08/23/19  2440     History   Chief Complaint Chief Complaint  Patient presents with  . Abdominal Pain    HPI Angela Manning is a 33 y.o. female.     Patient to ED with severe LLQ/left pelvic pain x 2 days, worse tonight waking her from sleep around 2:00 this morning. She took a Tylenol without relief. No fever, nausea, vomiting, bowel movement changes, dysuria, vaginal discharge or irregular vaginal bleeding. She denies chance of pregnancy. She feels the symptoms are the same as when she had an ovarian cyst in the past.   The history is provided by the patient. No language interpreter was used.  Abdominal Pain Associated symptoms: no constipation, no diarrhea, no dysuria, no fever, no nausea, no vaginal bleeding, no vaginal discharge and no vomiting     Past Medical History:  Diagnosis Date  . Bacterial vaginosis   . Ovarian cyst   . Pregnancy induced hypertension     Patient Active Problem List   Diagnosis Date Noted  . Nausea 05/09/2017  . Elevated blood pressure reading 05/09/2017  . Elevated serum hCG 03/12/2016  . Ovarian cyst, left 03/01/2015  . Abnormal uterine bleeding (AUB) 03/01/2015  . Dyspareunia 03/01/2015  . Smoker 10/23/2014  . Retained products of conception with hemorrhage 02/21/2013    Past Surgical History:  Procedure Laterality Date  . DILATION AND EVACUATION N/A 02/21/2013   Procedure: DILATATION AND EVACUATION;  Surgeon: Reva Bores, MD;  Location: [facility];  Service: Gynecology;  Laterality: N/A;     OB History    Gravida  4   Para  4   Term  3   Preterm  1   AB      Living  4     SAB      TAB      Ectopic      Multiple      Live Births  4            Home Medications    Prior to Admission medications   Medication Sig Start Date End Date Taking? Authorizing Provider  amLODipine (NORVASC) 2.5 MG tablet Take 1  tablet (2.5 mg total) by mouth daily. 09/24/18   Dahlia Byes A, NP  clindamycin (CLEOCIN) 150 MG capsule Take 1 capsule (150 mg total) by mouth every 6 (six) hours. 01/21/19   Elvina Sidle, MD  hydrochlorothiazide (HYDRODIURIL) 50 MG tablet Take 1 tablet (50 mg total) by mouth daily. 09/24/18   Dahlia Byes A, NP  norethindrone (MICRONOR,CAMILA,ERRIN) 0.35 MG tablet Take 1 tablet (0.35 mg total) by mouth daily. 10/07/18   Conan Bowens, MD    Family History Family History  Problem Relation Age of Onset  . Hypertension Mother     Social History Social History   Tobacco Use  . Smoking status: Former Smoker    Packs/day: 0.25    Types: Cigarettes  . Smokeless tobacco: Former Neurosurgeon    Quit date: 01/08/2015  Substance Use Topics  . Alcohol use: Yes    Alcohol/week: 0.0 standard drinks    Comment: occasionally  . Drug use: No     Allergies   Flagyl [metronidazole]   Review of Systems Review of Systems  Constitutional: Negative for fever.  Respiratory: Negative.   Cardiovascular: Negative.   Gastrointestinal: Positive for abdominal pain. Negative for constipation, diarrhea, nausea and vomiting.  Genitourinary: Positive  for pelvic pain. Negative for dysuria, flank pain, vaginal bleeding and vaginal discharge.  Musculoskeletal: Negative.  Negative for back pain.  Neurological: Negative.      Physical Exam Updated Vital Signs BP (!) 173/120 (BP Location: Right Arm)   Pulse 82   Temp 98.7 F (37.1 C) (Oral)   Resp 18   Ht 5\' 7"  (1.702 m)   Wt 59 kg   SpO2 99%   BMI 20.36 kg/m   Physical Exam Constitutional:      Appearance: She is well-developed.  Neck:     Musculoskeletal: Normal range of motion.  Cardiovascular:     Rate and Rhythm: Normal rate.  Pulmonary:     Effort: Pulmonary effort is normal.  Genitourinary:    Vagina: No vaginal discharge.     Cervix: No cervical motion tenderness, discharge, friability or erythema.     Uterus: Normal. Not tender.       Adnexa: Right adnexa normal.       Right: No tenderness.         Left: Tenderness present. No mass.    Skin:    General: Skin is warm and dry.  Neurological:     Mental Status: She is alert and oriented to person, place, and time.      ED Treatments / Results  Labs (all labs ordered are listed, but only abnormal results are displayed) Labs Reviewed  COMPREHENSIVE METABOLIC PANEL - Abnormal; Notable for the following components:      Result Value   Glucose, Bld 104 (*)    All other components within normal limits  URINALYSIS, ROUTINE W REFLEX MICROSCOPIC - Abnormal; Notable for the following components:   APPearance HAZY (*)    Leukocytes,Ua TRACE (*)    Bacteria, UA RARE (*)    All other components within normal limits  WET PREP, GENITAL  LIPASE, BLOOD  CBC  I-STAT BETA HCG BLOOD, ED ([facility], [facility], [facility] ONLY)  GC/CHLAMYDIA PROBE AMP ([place]) NOT AT [facility]    EKG None  Radiology No results found.  Procedures Procedures (including critical care time)  Medications Ordered in ED Medications  sodium chloride flush (NS) 0.9 % injection 3 mL (has no administration in time range)  naproxen (NAPROSYN) tablet 500 mg (has no administration in time range)     Initial Impression / Assessment and Plan / ED Course  I have reviewed the triage vital signs and the nursing notes.  Pertinent labs & imaging results that were available during my care of the patient were reviewed by me and considered in my medical decision making (see chart for details).        Patient to ED with c/o left pelvic tenderness similar to past ovarian cyst. No fever, discharge, urinary symptoms or irregular bleeding.   No abdominal tenderness on exam. She has no evidence of pelvic infection without cervical tenderness or discharge, but there is progressive pain and tenderness of left adnexa. Will obtain Pelvic US r/o torsion.   She is hypertensive and reports being out of her medications. She is given  a dose in the ED to treat elevated pressure. No chest pain, SOB.   Patient care signed out to oncoming provider pending re-evaluation after US, as well as recheck of blood pressure.   Final Clinical Impressions(s) / ED Diagnoses   Final diagnoses:  None   1. Pelvic pain 2. High blood pressure  ED Discharge Orders    None       Elpidio AnisUpstill, Lailee Hoelzel,  PA-C 08/23/19 0211    Orpah Greek, MD 08/24/19 0345

## 2019-08-23 NOTE — ED Provider Notes (Signed)
Care assumed from [facility], [place], at shift change, please see their notes for full documentation of patient's complaint/HPI. Briefly, pt here with LLQ/left pelvic pain x 2 days. Results so far show no leukocytosis, no electrolyte abnormalities, negative pregnancy test, unremarkable wet prep. Awaiting pelvic ultrasound to rule out torsion. Plan is to discharge home if all negative; can follow up with OBGYN.   Physical Exam  BP (!) 175/120 (BP Location: Right Arm)   Pulse 85   Temp 98.7 F (37.1 C) (Oral)   Resp 16   Ht 5\' 7"  (1.702 m)   Wt 59 kg   SpO2 100%   BMI 20.36 kg/m   Physical Exam  ED Course/Procedures     Procedures  MDM  Ultrasound negative for torsion.  Does show ovarian cyst on the left side.  This is likely causing patient's pain.  She will be given name of the woman's clinic to follow-up. Advised to take Ibuprofen/Tylenol PRN for pain and to use heating pads as needed. Previous ED provider prescribed antihypertensive medication to patient; she is supposed to be on Amlodipine and HCTZ but has been out for "Awhile." Pt advised to follow up with [facility] for primary care needs/additional prescriptions. Strict return precautions discussed with patient and all questions answered. She is in agreement with plan at this time and stable for discharge home.        Eustaquio Maize, PA-C 08/23/19 0830    Lajean Saver, MD 08/23/19 [phone]

## 2019-08-24 LAB — GC/CHLAMYDIA PROBE AMP (~~LOC~~) NOT AT ARMC
Chlamydia: NEGATIVE
Neisseria Gonorrhea: NEGATIVE

## 2019-09-29 ENCOUNTER — Ambulatory Visit (HOSPITAL_COMMUNITY)
Admission: EM | Admit: 2019-09-29 | Discharge: 2019-09-29 | Disposition: A | Discharge status: Home / Self Care | Payer: Medicaid Other | Attending: Family Medicine | Admitting: Family Medicine

## 2019-09-29 ENCOUNTER — Encounter (HOSPITAL_COMMUNITY): Payer: Self-pay

## 2019-09-29 ENCOUNTER — Other Ambulatory Visit: Payer: Self-pay

## 2019-09-29 DIAGNOSIS — R110 Nausea: Secondary | ICD-10-CM | POA: Diagnosis present

## 2019-09-29 DIAGNOSIS — R1030 Lower abdominal pain, unspecified: Secondary | ICD-10-CM | POA: Insufficient documentation

## 2019-09-29 DIAGNOSIS — Z3201 Encounter for pregnancy test, result positive: Secondary | ICD-10-CM | POA: Insufficient documentation

## 2019-09-29 LAB — POCT URINALYSIS DIP (DEVICE)
Bilirubin Urine: NEGATIVE
Glucose, UA: NEGATIVE mg/dL
Hgb urine dipstick: NEGATIVE
Ketones, ur: NEGATIVE mg/dL
Leukocytes,Ua: NEGATIVE
Nitrite: POSITIVE — AB
Protein, ur: NEGATIVE mg/dL
Specific Gravity, Urine: 1.025 (ref 1.005–1.030)
Urobilinogen, UA: 1 mg/dL (ref 0.0–1.0)
pH: 7 (ref 5.0–8.0)

## 2019-09-29 LAB — POCT PREGNANCY, URINE: Preg Test, Ur: POSITIVE — AB

## 2019-09-29 MED ORDER — NAPROXEN 500 MG PO TABS: 500.0000 mg | ORAL_TABLET | Freq: Two times a day (BID) | ORAL | 0 refills | Status: DC

## 2019-09-29 MED ORDER — ONDANSETRON 4 MG PO TBDP: 4.0000 mg | ORAL_TABLET | Freq: Three times a day (TID) | ORAL | 0 refills | Status: DC | PRN

## 2019-09-29 MED ORDER — DOXYLAMINE-PYRIDOXINE 10-10 MG PO TBEC: 2.0000 | DELAYED_RELEASE_TABLET | Freq: Every day | ORAL | 0 refills | Status: DC

## 2019-09-29 MED ORDER — PRENATAL ADULT GUMMY/DHA/FA 0.4-25 MG PO CHEW: 1.0000 | CHEWABLE_TABLET | Freq: Every day | ORAL | 0 refills | Status: DC

## 2019-09-29 NOTE — Discharge Instructions (Addendum)
Pregnancy test positive Read attached prenatal care Begin prenatal vitamin  Diclegis: Two tablets at bedtime on day 1 and 2; if symptoms persist, take 1 tablet in morning and 2 tablets at bedtime on day 3; if symptoms persist, may increase to 1 tablet in morning, 1 tablet mid-afternoon, and 2 tablets at bedtime on day 4 (maximum: doxylamine 40 mg/pyridoxine 40 mg (4 tablets) per day). OR One-half of the 25 mg Unisom sleep tablet over-the-counter tablet or two chewable 5 mg tablets can be used off-label as an antiemetic. In addition, pyridoxine 25 mg, also available over-the-counter, is taken three or four times per day;This is a reasonable, less expensive substitute for combination tablets.   Follow-up with OB/GYN as planned  [facility] for further evaluation of abdominal pain

## 2019-09-29 NOTE — ED Provider Notes (Addendum)
[facility]    CSN: [id number] Arrival date & time: 09/29/19  1128      History   Chief Complaint Chief Complaint  Patient presents with  . nausea/lower abd pain    HPI Angela Manning is a 33 y.o. female history of ovarian cyst, presenting today for evaluation of nausea and abdominal pain.  Patient states that over the past 3 days she has felt nauseous.  She has also had some lower abdominal discomfort.  She does note that she has a known cyst on her left ovary which was found on ultrasound on 9/14.  Her last menstrual cycle was the middle of September and she has not had her menstrual cycle this month.  Denies any pelvic pain, abnormal discharge, urinary symptoms of dysuria, increased frequency or urgency.  Has previously been pregnant, denies complications with previous pregnancies.  HPI  Past Medical History:  Diagnosis Date  . Bacterial vaginosis   . Ovarian cyst   . Pregnancy induced hypertension     Patient Active Problem List   Diagnosis Date Noted  . Nausea 05/09/2017  . Elevated blood pressure reading 05/09/2017  . Elevated serum hCG 03/12/2016  . Ovarian cyst, left 03/01/2015  . Abnormal uterine bleeding (AUB) 03/01/2015  . Dyspareunia 03/01/2015  . Smoker 10/23/2014  . Retained products of conception with hemorrhage 02/21/2013    Past Surgical History:  Procedure Laterality Date  . DILATION AND EVACUATION N/A 02/21/2013   Procedure: DILATATION AND EVACUATION;  Surgeon: Reva Boresanya S Pratt, MD;  Location: [facility];  Service: Gynecology;  Laterality: N/A;    OB History    Gravida  4   Para  4   Term  3   Preterm  1   AB      Living  4     SAB      TAB      Ectopic      Multiple      Live Births  4            Home Medications    Prior to Admission medications   Medication Sig Start Date End Date Taking? Authorizing Provider  amLODipine (NORVASC) 2.5 MG tablet Take 1 tablet (2.5 mg total) by mouth daily. 08/23/19   Elpidio AnisUpstill,  Shari, PA-C  Doxylamine-Pyridoxine 10-10 MG TBEC Take 2 tablets by mouth at bedtime. 09/29/19   Evynn Boutelle C, PA-C  hydrochlorothiazide (HYDRODIURIL) 50 MG tablet Take 1 tablet (50 mg total) by mouth daily. 08/23/19   Elpidio AnisUpstill, Shari, PA-C  Prenatal MV & Min w/FA-DHA (PRENATAL ADULT GUMMY/DHA/FA) 0.4-25 MG CHEW Chew 1 tablet by mouth daily. 09/29/19   Altonio Schwertner C, PA-C  norethindrone (MICRONOR,CAMILA,ERRIN) 0.35 MG tablet Take 1 tablet (0.35 mg total) by mouth daily. Patient not taking: Reported on 08/23/2019 10/07/18 09/29/19  Conan Bowensavis, Kelly M, MD    Family History Family History  Problem Relation Age of Onset  . Hypertension Mother   . Healthy Father     Social History Social History   Tobacco Use  . Smoking status: Former Smoker    Packs/day: 0.25    Types: Cigarettes  . Smokeless tobacco: Former NeurosurgeonUser    Quit date: 01/08/2015  Substance Use Topics  . Alcohol use: Yes    Alcohol/week: 0.0 standard drinks    Comment: occasionally  . Drug use: No     Allergies   Flagyl [metronidazole]   Review of Systems Review of Systems  Constitutional: Negative for fever.  Respiratory: Negative  for shortness of breath.   Cardiovascular: Negative for chest pain.  Gastrointestinal: Positive for abdominal pain and nausea. Negative for diarrhea and vomiting.  Genitourinary: Negative for dysuria, flank pain, genital sores, hematuria, menstrual problem, vaginal bleeding, vaginal discharge and vaginal pain.  Musculoskeletal: Negative for back pain.  Skin: Negative for rash.  Neurological: Negative for dizziness, light-headedness and headaches.     Physical Exam Triage Vital Signs ED Triage Vitals  Enc Vitals Group     BP 09/29/19 1205 (!) 138/102     Pulse Rate 09/29/19 1205 81     Resp 09/29/19 1205 16     Temp 09/29/19 1205 98.8 F (37.1 C)     Temp Source 09/29/19 1205 Oral     SpO2 09/29/19 1205 100 %     Weight --      Height --      Head Circumference --      Peak  Flow --      Pain Score 09/29/19 1213 1     Pain Loc --      Pain Edu? --      Excl. in [place]? --    No data found.  Updated Vital Signs BP (!) 138/102 (BP Location: Left Arm)   Pulse 81   Temp 98.8 F (37.1 C) (Oral)   Resp 16   LMP 08/20/2019   SpO2 100%   Visual Acuity Right Eye Distance:   Left Eye Distance:   Bilateral Distance:    Right Eye Near:   Left Eye Near:    Bilateral Near:     Physical Exam Vitals signs and nursing note reviewed.  Constitutional:      Appearance: She is well-developed.     Comments: No acute distress  HENT:     Head: Normocephalic and atraumatic.     Nose: Nose normal.  Eyes:     Conjunctiva/sclera: Conjunctivae normal.  Neck:     Musculoskeletal: Neck supple.  Cardiovascular:     Rate and Rhythm: Normal rate.  Pulmonary:     Effort: Pulmonary effort is normal. No respiratory distress.     Comments: Breathing comfortably at rest, CTABL, no wheezing, rales or other adventitious sounds auscultated Abdominal:     General: There is no distension.     Tenderness: There is abdominal tenderness.     Comments: Tender to palpation of left lower quadrant  Musculoskeletal: Normal range of motion.  Skin:    General: Skin is warm and dry.  Neurological:     Mental Status: She is alert and oriented to person, place, and time.      UC Treatments / Results  Labs (all labs ordered are listed, but only abnormal results are displayed) Labs Reviewed  POCT URINALYSIS DIP (DEVICE) - Abnormal; Notable for the following components:      Result Value   Nitrite POSITIVE (*)    All other components within normal limits  POCT PREGNANCY, URINE - Abnormal; Notable for the following components:   Preg Test, Ur POSITIVE (*)    All other components within normal limits  URINE CULTURE  POC URINE PREG, ED    EKG   Radiology No results found.  Procedures Procedures (including critical care time)  Medications Ordered in UC Medications - No data  to display  Initial Impression / Assessment and Plan / UC Course  I have reviewed the triage vital signs and the nursing notes.  Pertinent labs & imaging results that were available during my care  of the patient were reviewed by me and considered in my medical decision making (see chart for details).     Pregnancy test positive, discussed starting prenatal, given patient's abdominal pain recommending follow-up with [facility] to rule out ectopic.  Diclegis for nausea, follow-up with OB/GYN.  UA with positive nitrites, but no other abnormalities will send for urine culture.  Discussed strict return precautions. Patient verbalized understanding and is agreeable with plan.  Final Clinical Impressions(s) / UC Diagnoses   Final diagnoses:  Nausea without vomiting  Lower abdominal pain  Positive pregnancy test     Discharge Instructions     Pregnancy test positive Read attached prenatal care Begin prenatal vitamin  Diclegis: Two tablets at bedtime on day 1 and 2; if symptoms persist, take 1 tablet in morning and 2 tablets at bedtime on day 3; if symptoms persist, may increase to 1 tablet in morning, 1 tablet mid-afternoon, and 2 tablets at bedtime on day 4 (maximum: doxylamine 40 mg/pyridoxine 40 mg (4 tablets) per day). OR One-half of the 25 mg Unisom sleep tablet over-the-counter tablet or two chewable 5 mg tablets can be used off-label as an antiemetic. In addition, pyridoxine 25 mg, also available over-the-counter, is taken three or four times per day;This is a reasonable, less expensive substitute for combination tablets.   Follow-up with OB/GYN as planned  [facility] for further evaluation of abdominal pain   ED Prescriptions    Medication Sig Dispense Auth. Provider   naproxen (NAPROSYN) 500 MG tablet  (Status: Discontinued) Take 1 tablet (500 mg total) by mouth 2 (two) times daily. 30 tablet Zelphia Glover C, PA-C   ondansetron (ZOFRAN ODT) 4 MG  disintegrating tablet  (Status: Discontinued) Take 1 tablet (4 mg total) by mouth every 8 (eight) hours as needed for nausea or vomiting. 20 tablet Canna Nickelson C, PA-C   Doxylamine-Pyridoxine 10-10 MG TBEC Take 2 tablets by mouth at bedtime. 60 tablet Betha Shadix C, PA-C   Prenatal MV & Min w/FA-DHA (PRENATAL ADULT GUMMY/DHA/FA) 0.4-25 MG CHEW Chew 1 tablet by mouth daily. 60 tablet Kadarius Cuffe, [place] C, PA-C     PDMP not reviewed this encounter.   Sharyon Cable, [place] C, PA-C 09/29/19 1249    Ahmarion Saraceno, [place] C, PA-C 09/29/19 1250

## 2019-09-29 NOTE — ED Triage Notes (Signed)
Pt presents to UC w/ c/o nausea and lower abdominal discomfort x2 days.

## 2019-09-30 LAB — URINE CULTURE: Culture: <10000 — AB

## 2019-10-03 ENCOUNTER — Encounter (HOSPITAL_COMMUNITY): Payer: Self-pay | Admitting: Student

## 2019-10-03 ENCOUNTER — Inpatient Hospital Stay (HOSPITAL_COMMUNITY): Payer: Medicaid Other

## 2019-10-03 ENCOUNTER — Inpatient Hospital Stay (HOSPITAL_COMMUNITY)
Admission: AD | Admit: 2019-10-03 | Discharge: 2019-10-03 | Disposition: A | Discharge status: Home / Self Care | Payer: Medicaid Other | Attending: Obstetrics and Gynecology | Admitting: Obstetrics and Gynecology

## 2019-10-03 ENCOUNTER — Other Ambulatory Visit: Payer: Self-pay

## 2019-10-03 DIAGNOSIS — N760 Acute vaginitis: Secondary | ICD-10-CM

## 2019-10-03 DIAGNOSIS — Z8249 Family history of ischemic heart disease and other diseases of the circulatory system: Secondary | ICD-10-CM | POA: Insufficient documentation

## 2019-10-03 DIAGNOSIS — Z87891 Personal history of nicotine dependence: Secondary | ICD-10-CM | POA: Diagnosis not present

## 2019-10-03 DIAGNOSIS — R1032 Left lower quadrant pain: Secondary | ICD-10-CM | POA: Insufficient documentation

## 2019-10-03 DIAGNOSIS — Z3A01 Less than 8 weeks gestation of pregnancy: Secondary | ICD-10-CM | POA: Diagnosis not present

## 2019-10-03 DIAGNOSIS — Z3491 Encounter for supervision of normal pregnancy, unspecified, first trimester: Secondary | ICD-10-CM

## 2019-10-03 DIAGNOSIS — O26891 Other specified pregnancy related conditions, first trimester: Secondary | ICD-10-CM

## 2019-10-03 DIAGNOSIS — O23591 Infection of other part of genital tract in pregnancy, first trimester: Secondary | ICD-10-CM | POA: Insufficient documentation

## 2019-10-03 DIAGNOSIS — Z883 Allergy status to other anti-infective agents status: Secondary | ICD-10-CM | POA: Insufficient documentation

## 2019-10-03 DIAGNOSIS — R109 Unspecified abdominal pain: Secondary | ICD-10-CM

## 2019-10-03 DIAGNOSIS — O161 Unspecified maternal hypertension, first trimester: Secondary | ICD-10-CM | POA: Insufficient documentation

## 2019-10-03 DIAGNOSIS — B9689 Other specified bacterial agents as the cause of diseases classified elsewhere: Secondary | ICD-10-CM | POA: Insufficient documentation

## 2019-10-03 DIAGNOSIS — O10911 Unspecified pre-existing hypertension complicating pregnancy, first trimester: Secondary | ICD-10-CM | POA: Diagnosis not present

## 2019-10-03 LAB — CBC
HCT: 36.7 % (ref 36.0–46.0)
Hemoglobin: 12.2 g/dL (ref 12.0–15.0)
MCH: 27.7 pg (ref 26.0–34.0)
MCHC: 33.2 g/dL (ref 30.0–36.0)
MCV: 83.4 fL (ref 80.0–100.0)
Platelets: 215 10*3/uL (ref 150–400)
RBC: 4.4 MIL/uL (ref 3.87–5.11)
RDW: 14.9 % (ref 11.5–15.5)
WBC: 6.6 10*3/uL (ref 4.0–10.5)
nRBC: 0 % (ref 0.0–0.2)

## 2019-10-03 LAB — URINALYSIS, ROUTINE W REFLEX MICROSCOPIC
Bilirubin Urine: NEGATIVE
Glucose, UA: NEGATIVE mg/dL
Hgb urine dipstick: NEGATIVE
Ketones, ur: NEGATIVE mg/dL
Leukocytes,Ua: NEGATIVE
Nitrite: NEGATIVE
Protein, ur: NEGATIVE mg/dL
Specific Gravity, Urine: 1.023 (ref 1.005–1.030)
pH: 6 (ref 5.0–8.0)

## 2019-10-03 LAB — HCG, QUANTITATIVE, PREGNANCY: hCG, Beta Chain, Quant, S: 15818 m[IU]/mL — ABNORMAL HIGH (ref <5)

## 2019-10-03 LAB — WET PREP, GENITAL
Sperm: NONE SEEN
Trich, Wet Prep: NONE SEEN
Yeast Wet Prep HPF POC: NONE SEEN

## 2019-10-03 MED ORDER — LABETALOL HCL 200 MG PO TABS: 200.0000 mg | ORAL_TABLET | Freq: Two times a day (BID) | ORAL | 0 refills | Status: DC

## 2019-10-03 MED ORDER — CLINDAMYCIN HCL 300 MG PO CAPS: 300.0000 mg | ORAL_CAPSULE | Freq: Two times a day (BID) | ORAL | 0 refills | Status: DC

## 2019-10-03 MED ORDER — ACETAMINOPHEN 500 MG PO TABS
1000.0000 mg | ORAL_TABLET | Freq: Once | ORAL | Status: AC
  Administered 2019-10-03: 15:00:00 1000 mg via ORAL
  Filled 2019-10-03: qty 2

## 2019-10-03 NOTE — MAU Provider Note (Signed)
Chief Complaint: Abdominal Pain, Vaginal Discharge, and Vaginal Bleeding   First Provider Initiated Contact with Patient 10/03/19 1127     SUBJECTIVE HPI: Angela Manning is a 33 y.o. [id number] at [redacted]w[redacted]d who presents to [facility] reporting abdominal pain & pink tinged discharge. Went to urgent care on 10/21 & had pregnancy confirmed. Has had LLQ pain since then that is intermittent & radiates to her vagina. Also reports light pink tinged spotting on toilet paper today.  Denies n/v/d, fever/chills, dysuria, vaginal irritation, or bright red bleeding. Last BM was today and was normal.   Location: abdomen Quality: cramping Severity: 7/10 on pain scale Duration: 3 days Timing: intermittent Modifying factors: none Associated signs and symptoms: pink spotting  Past Medical History:  Diagnosis Date  . Bacterial vaginosis   . Ovarian cyst   . Pregnancy induced hypertension    OB History  Gravida Para Term Preterm AB Living  5 4 3 1   4   SAB TAB Ectopic Multiple Live Births          4    # Outcome Date GA Lbr Len/2nd Weight Sex Delivery Anes PTL Lv  5 Current           4 Term      Vag-Spont   LIV  3 Term      Vag-Spont   LIV  2 Term      Vag-Spont   LIV  1 Preterm      Vag-Spont   LIV   Past Surgical History:  Procedure Laterality Date  . DILATION AND EVACUATION N/A 02/21/2013   Procedure: DILATATION AND EVACUATION;  Surgeon: 02/23/2013, MD;  Location: [facility];  Service: Gynecology;  Laterality: N/A;   Social History   Socioeconomic History  . Marital status: Single    Spouse name: Not on file  . Number of children: Not on file  . Years of education: Not on file  . Highest education level: Not on file  Occupational History  . Not on file  Social Needs  . Financial resource strain: Not on file  . Food insecurity    Worry: Not on file    Inability: Not on file  . Transportation needs    Medical: Not on file    Non-medical: Not on file  Tobacco Use  . Smoking  status: Former Smoker    Packs/day: 0.25    Types: Cigarettes  . Smokeless tobacco: Former Reva Bores    Quit date: 01/08/2015  Substance and Sexual Activity  . Alcohol use: Not Currently    Alcohol/week: 0.0 standard drinks    Comment: occasionally  . Drug use: No  . Sexual activity: Yes    Partners: Male  Lifestyle  . Physical activity    Days per week: Not on file    Minutes per session: Not on file  . Stress: Not on file  Relationships  . Social 01/10/2015 on phone: Not on file    Gets together: Not on file    Attends religious service: Not on file    Active member of club or organization: Not on file    Attends meetings of clubs or organizations: Not on file    Relationship status: Not on file  . Intimate partner violence    Fear of current or ex partner: Not on file    Emotionally abused: Not on file    Physically abused: Not on file    Forced sexual activity: Not  on file  Other Topics Concern  . Not on file  Social History Narrative  . Not on file   Family History  Problem Relation Age of Onset  . Hypertension Mother   . Healthy Father    No current facility-administered medications on file prior to encounter.    Current Outpatient Medications on File Prior to Encounter  Medication Sig Dispense Refill  . Prenatal MV & Min w/FA-DHA (PRENATAL ADULT GUMMY/DHA/FA) 0.4-25 MG CHEW Chew 1 tablet by mouth daily. 60 tablet 0  . Doxylamine-Pyridoxine 10-10 MG TBEC Take 2 tablets by mouth at bedtime. 60 tablet 0  . [DISCONTINUED] norethindrone (MICRONOR,CAMILA,ERRIN) 0.35 MG tablet Take 1 tablet (0.35 mg total) by mouth daily. (Patient not taking: Reported on 08/23/2019) 1 Package 11   Allergies  Allergen Reactions  . Flagyl [Metronidazole] Rash    "Breaks out in blisters"    I have reviewed patient's Past Medical Hx, Surgical Hx, Family Hx, Social Hx, medications and allergies.   Review of Systems  Constitutional: Negative.   Gastrointestinal: Positive for  abdominal pain. Negative for constipation, diarrhea, nausea and vomiting.  Genitourinary: Positive for vaginal bleeding and vaginal discharge. Negative for dysuria.    OBJECTIVE Patient Vitals for the past 24 hrs:  BP Temp Temp src Pulse Resp SpO2 Height Weight  10/03/19 1531 (!) 157/102 - - 70 - - - -  10/03/19 1059 (!) 126/94 98.4 F (36.9 C) Oral 83 16 100 % - -  10/03/19 1056 - - - - - - 5\' 7"  (1.702 m) 60 kg   Constitutional: Well-developed, well-nourished female in no acute distress.  Cardiovascular: normal rate & rhythm, no murmur Respiratory: normal rate and effort. Lung sounds clear throughout GI: Abd soft, non-tender, Pos BS x 4. No guarding or rebound tenderness MS: Extremities nontender, no edema, normal ROM Neurologic: Alert and oriented x 4.  GU:     SPECULUM EXAM: NEFG, no blood. Foul smelling thin tan discharge. Cervix pink/smooth/not friable  BIMANUAL: No CMT. cervix closed; uterus normal size, no adnexal tenderness or masses.    LAB RESULTS Results for orders placed or performed during the hospital encounter of 10/03/19 (from the past 24 hour(s))  Urinalysis, Routine w reflex microscopic     Status: None   Collection Time: 10/03/19 11:08 AM  Result Value Ref Range   Color, Urine YELLOW YELLOW   APPearance CLEAR CLEAR   Specific Gravity, Urine 1.023 1.005 - 1.030   pH 6.0 5.0 - 8.0   Glucose, UA NEGATIVE NEGATIVE mg/dL   Hgb urine dipstick NEGATIVE NEGATIVE   Bilirubin Urine NEGATIVE NEGATIVE   Ketones, ur NEGATIVE NEGATIVE mg/dL   Protein, ur NEGATIVE NEGATIVE mg/dL   Nitrite NEGATIVE NEGATIVE   Leukocytes,Ua NEGATIVE NEGATIVE  CBC     Status: None   Collection Time: 10/03/19 11:55 AM  Result Value Ref Range   WBC 6.6 4.0 - 10.5 K/uL   RBC 4.40 3.87 - 5.11 MIL/uL   Hemoglobin 12.2 12.0 - 15.0 g/dL   HCT 16.136.7 09.636.0 - 04.546.0 %   MCV 83.4 80.0 - 100.0 fL   MCH 27.7 26.0 - 34.0 pg   MCHC 33.2 30.0 - 36.0 g/dL   RDW 40.914.9 81.111.5 - 91.415.5 %   Platelets 215 150 -  400 K/uL   nRBC 0.0 0.0 - 0.2 %  hCG, quantitative, pregnancy     Status: Abnormal   Collection Time: 10/03/19 11:55 AM  Result Value Ref Range   hCG, Beta Chain, Quant, S  40,981 (H) <5 mIU/mL  Wet prep, genital     Status: Abnormal   Collection Time: 10/03/19  1:17 PM   Specimen: PATH Cytology Cervicovaginal Ancillary Only  Result Value Ref Range   Yeast Wet Prep HPF POC NONE SEEN NONE SEEN   Trich, Wet Prep NONE SEEN NONE SEEN   Clue Cells Wet Prep HPF POC PRESENT (A) NONE SEEN   WBC, Wet Prep HPF POC MANY (A) NONE SEEN   Sperm NONE SEEN     IMAGING US Ob Less Than 14 Weeks With Ob Transvaginal  Result Date: 10/03/2019 CLINICAL DATA:  Pelvic pain with positive beta HCG EXAM: OBSTETRIC <14 WK [place] AND TRANSVAGINAL OB US TECHNIQUE: Both transabdominal and transvaginal ultrasound examinations were performed for complete evaluation of the gestation as well as the maternal uterus, adnexal regions, and pelvic cul-de-sac. Transvaginal technique was performed to assess early pregnancy. COMPARISON:  None. FINDINGS: Intrauterine gestational sac: Visualized Yolk sac:  Visualized Embryo:  Not visualized Cardiac Activity: Not visualized MSD: 11 mm   5 w   5 d Subchorionic hemorrhage:  None visualized. Maternal uterus/adnexae: Right ovary measures 3.4 x 2.0 x 2.0 cm. Left ovary measures 4.9 x 3.4 x 3.0 cm. There is no appreciable pelvic fluid. IMPRESSION: Within the uterus, there is a gestational sac and yolk sac. Fetal pole is not appreciable. Advise follow-up ultrasound in 10-14 days to assess for fetal pole and fetal heart activity. No subchorionic hemorrhage evident. No extrauterine pelvic mass or free pelvic fluid. Electronically Signed   By: Bretta Bang III M.D.   On: 10/03/2019 14:39    MAU COURSE Orders Placed This Encounter  Procedures  . Wet prep, genital  . US OB LESS THAN 14 WEEKS WITH OB TRANSVAGINAL  . US OB Transvaginal  . Urinalysis, Routine w reflex microscopic  . CBC  . hCG,  quantitative, pregnancy  . Discharge patient   Meds ordered this encounter  Medications  . acetaminophen (TYLENOL) tablet 1,000 mg  . clindamycin (CLEOCIN) 300 MG capsule    Sig: Take 1 capsule (300 mg total) by mouth 2 (two) times daily. For seven days    Dispense:  14 capsule    Refill:  0    Order Specific Question:   Supervising Provider    Answer:   Conan Bowens [[id number]]  . labetalol (NORMODYNE) 200 MG tablet    Sig: Take 1 tablet (200 mg total) by mouth 2 (two) times daily.    Dispense:  60 tablet    Refill:  0    Order Specific Question:   Supervising Provider    Answer:   Conan Bowens [[id number]]    MDM +UPT UA, wet prep, GC/chlamydia, CBC, ABO/Rh, quant hCG, and [place] today to rule out ectopic pregnancy  RH positive  Ultrasound shows IUGS with yolk sac. Will order outpatient viability ultrasound in 10-14 days  Wet prep + clue cells. Pt allergic to flagyl. Will give clinda for BV  Pt with chronic hypertension on HCTZ & norvasc. Will switch to labetalol 200 mg BID per c/w Dr. Earlene Plater.  ASSESSMENT 1. Normal IUP (intrauterine pregnancy) on prenatal ultrasound, first trimester   2. Abdominal pain during pregnancy in first trimester   3. BV (bacterial vaginosis)   4. Maternal chronic hypertension in first trimester     PLAN Discharge home in stable condition. SAB precautions GC/CT pending D/c norvasc & HCTZ Rx clindamycin. Rx labetalol Outpatient viability scan ordered  Follow-up Information    [facility]  HOSPITAL OUTPATIENT ULTRASOUND Follow up.   Specialty: Radiology Why: the ultrasound department will call you to schedule your appointment & the [facility] office will review the ultrasound report with you Contact information: [address] [place], [place] [phone] [place] [id number] [facility] Follow up.   Specialty: Obstetrics and Gynecology Why: return for  worsening symptoms Contact information: [address] [phone] [place] [phone]         Allergies as of 10/03/2019      Reactions   Flagyl [metronidazole] Rash   "Breaks out in blisters"      Medication List    STOP taking these medications   amLODipine 2.5 MG tablet Commonly known as: Norvasc   hydrochlorothiazide 50 MG tablet Commonly known as: HYDRODIURIL     TAKE these medications   clindamycin 300 MG capsule Commonly known as: CLEOCIN Take 1 capsule (300 mg total) by mouth 2 (two) times daily. For seven days   Doxylamine-Pyridoxine 10-10 MG Tbec Take 2 tablets by mouth at bedtime.   labetalol 200 MG tablet Commonly known as: NORMODYNE Take 1 tablet (200 mg total) by mouth 2 (two) times daily.   Prenatal Adult Gummy/DHA/FA 0.4-25 MG Chew Chew 1 tablet by mouth daily.        Jorje Guild, NP 10/03/2019  5:00 PM

## 2019-10-03 NOTE — MAU Note (Signed)
Angela Manning is a 33 y.o. at [redacted]w[redacted]d here in MAU reporting: for the last 2 days she will see some pink mucus when she wipes in the bathroom, states it is intermittent. Has ongoing abdominal pain since her visit to urgent care. No recent IC. No other abnormal discharge.   Onset of complaint: pain ongoing, pink discharge for 2 days  Pain score: 7/10  Vitals:   10/03/19 1059  BP: (!) 126/94  Pulse: 83  Resp: 16  Temp: 98.4 F (36.9 C)  SpO2: 100%     Lab orders placed from triage: UA

## 2019-10-03 NOTE — Discharge Instructions (Signed)
[facility]   Center for [facility]       Phone: [phone]  Center for [facility] [place] Phone: [phone]  Center for [facility] [place]  Phone: [phone]  [facility] for [facility]  Phone: [phone]  [facility] for [facility] [place]  Phone: [phone]  [facility]       Phone: [phone]  [facility]    Phone: [phone]   [facility]    Phone: [phone]  [facility]    Phone: [phone]  [facility]    Phone: [phone]   [facility]  Phone: [phone]  [facility]    Phone: [phone]  [facility]   Phone: [phone]  [facility]    Phone: [phone]     Bacterial Vaginosis  Bacterial vaginosis is a vaginal infection that occurs when the normal balance of bacteria in the vagina is disrupted. It results from an overgrowth of certain bacteria. This is the most common vaginal infection among women ages 4615-44. Because bacterial vaginosis increases your risk for STIs (sexually transmitted infections), getting treated can help reduce your risk for chlamydia, gonorrhea, herpes, and HIV (human immunodeficiency virus). Treatment is also important for preventing complications in pregnant women, because this condition can cause an early (premature) delivery. What are the causes? This condition is caused by an increase in harmful bacteria that are normally present in small amounts in the vagina. However, the reason that the condition develops is not fully understood. What increases the risk? The following factors may make you more likely to develop this condition:  Having a new sexual partner or multiple sexual partners.  Having  unprotected sex.  Douching.  Having an intrauterine device (IUD).  Smoking.  Drug and alcohol abuse.  Taking certain antibiotic medicines.  Being pregnant. You cannot get bacterial vaginosis from toilet seats, bedding, swimming pools, or contact with objects around you. What are the signs or symptoms? Symptoms of this condition include:  Grey or white vaginal discharge. The discharge can also be watery or foamy.  A fish-like odor with discharge, especially after sexual intercourse or during menstruation.  Itching in and around the vagina.  Burning or pain with urination. Some women with bacterial vaginosis have no signs or symptoms. How is this diagnosed? This condition is diagnosed based on:  Your medical history.  A physical exam of the vagina.  Testing a sample of vaginal fluid under a microscope to look for a large amount of bad bacteria or abnormal cells. Your health care provider may use a cotton swab or a small wooden spatula to collect the sample. How is this treated? This condition is treated with antibiotics. These may be given as a pill, a vaginal cream, or a medicine that is put into the vagina (suppository). If the condition comes back after treatment, a second round of antibiotics may be needed. Follow these instructions at home: Medicines  Take over-the-counter and prescription medicines only as told by your health care provider.  Take or use your antibiotic as told by your health care provider. Do not stop taking or using the antibiotic even if you start to feel better. General instructions  If you have a female sexual partner, tell her that you have a vaginal infection. She should see her health care provider and be treated if she has symptoms.  If you have a female sexual partner, he does not need treatment.  During treatment: ? Avoid sexual activity until you finish treatment. ? Do not douche. ? Avoid alcohol as directed by your health care  provider. ? Avoid breastfeeding as directed by your health care provider.  Drink enough water and fluids to keep your urine clear or pale yellow.  Keep the area around your vagina and rectum clean. ? Wash the area daily with warm water. ? Wipe yourself from front to back after using the toilet.  Keep all follow-up visits as told by your health care provider. This is important. How is this prevented?  Do not douche.  Wash the outside of your vagina with warm water only.  Use protection when having sex. This includes latex condoms and dental dams.  Limit how many sexual partners you have. To help prevent bacterial vaginosis, it is best to have sex with just one partner (monogamous).  Make sure you and your sexual partner are tested for STIs.  Wear cotton or cotton-lined underwear.  Avoid wearing tight pants and pantyhose, especially during summer.  Limit the amount of alcohol that you drink.  Do not use any products that contain nicotine or tobacco, such as cigarettes and e-cigarettes. If you need help quitting, ask your health care provider.  Do not use illegal drugs. Where to find more information  Centers for Disease Control and Prevention: AppraiserFraud.fi  American Sexual Health Association (ASHA): www.ashastd.org  U.S. Department of Health and Financial controller, Office on Women's Health: DustingSprays.pl or SecuritiesCard.it Contact a health care provider if:  Your symptoms do not improve, even after treatment.  You have more discharge or pain when urinating.  You have a fever.  You have pain in your abdomen.  You have pain during sex.  You have vaginal bleeding between periods. Summary  Bacterial vaginosis is a vaginal infection that occurs when the normal balance of bacteria in the vagina is disrupted.  Because bacterial vaginosis increases your risk for STIs (sexually transmitted infections), getting treated can  help reduce your risk for chlamydia, gonorrhea, herpes, and HIV (human immunodeficiency virus). Treatment is also important for preventing complications in pregnant women, because the condition can cause an early (premature) delivery.  This condition is treated with antibiotic medicines. These may be given as a pill, a vaginal cream, or a medicine that is put into the vagina (suppository). This information is not intended to replace advice given to you by your health care provider. Make sure you discuss any questions you have with your health care provider. Document Released: 11/25/2005 Document Revised: 11/07/2017 Document Reviewed: 08/10/2016 Elsevier Patient Education  2020 [place].     Hypertension During Pregnancy High blood pressure (hypertension) is when the force of blood pumping through the arteries is too strong. Arteries are blood vessels that carry blood from the heart throughout the body. Hypertension during pregnancy can be mild or severe. Severe hypertension during pregnancy (preeclampsia) is a medical emergency that requires prompt evaluation and treatment. Different types of hypertension can happen during pregnancy. These include:  Chronic hypertension. This happens when you had high blood pressure before you became pregnant, and it continues during the pregnancy. Hypertension that develops before you are [redacted] weeks pregnant and continues during the pregnancy is also called chronic hypertension. If you have chronic hypertension, it will not go away after you have your baby. You will need follow-up visits with your health care provider after you have your baby. Your doctor may  want you to keep taking medicine for your blood pressure.  Gestational hypertension. This is hypertension that develops after the 20th week of pregnancy. Gestational hypertension usually goes away after you have your baby, but your health care provider will need to monitor your blood pressure to make sure  that it is getting better.  Preeclampsia. This is severe hypertension during pregnancy. This can cause serious complications for you and your baby and can also cause complications for you after the delivery of your baby.  Postpartum preeclampsia. You may develop severe hypertension after giving birth. This usually occurs within 48 hours after childbirth but may occur up to 6 weeks after giving birth. This is rare. How does this affect me? Women who have hypertension during pregnancy have a greater chance of developing hypertension later in life or during future pregnancies. In some cases, hypertension during pregnancy can cause serious complications, such as:  Stroke.  Heart attack.  Injury to other organs, such as kidneys, lungs, or liver.  Preeclampsia.  Convulsions or seizures.  Placental abruption. How does this affect my baby? Hypertension during pregnancy can affect your baby. Your baby may:  Be born early (prematurely).  Not weigh as much as he or she should at birth (low birth weight).  Not tolerate labor well, leading to an unplanned cesarean delivery. What are the risks? There are certain factors that make it more likely for you to develop hypertension during pregnancy. These include:  Having hypertension during a previous pregnancy.  Being overweight.  Being age 70 or older.  Being pregnant for the first time.  Being pregnant with more than one baby.  Becoming pregnant using fertilization methods, such as IVF (in vitro fertilization).  Having other medical problems, such as diabetes, kidney disease, or lupus.  Having a family history of hypertension. What can I do to lower my risk? The exact cause of hypertension during pregnancy is not known. You may be able to lower your risk by:  Maintaining a healthy weight.  Eating a healthy and balanced diet.  Following your health care provider's instructions about treating any long-term conditions that you had  before becoming pregnant. It is very important to keep all of your prenatal care appointments. Your health care provider will check your blood pressure and make sure that your pregnancy is progressing as expected. If a problem is found, early treatment can prevent complications. How is this treated? Treatment for hypertension during pregnancy varies depending on the type of hypertension you have and how serious it is.  If you were taking medicine for high blood pressure before you became pregnant, talk with your health care provider. You may need to change medicine during pregnancy because some medicines, like ACE inhibitors, may not be considered safe for your baby.  If you have gestational hypertension, your health care provider may order medicine to treat this during pregnancy.  If you are at risk for preeclampsia, your health care provider may recommend that you take a low-dose aspirin during your pregnancy.  If you have severe hypertension, you may need to be hospitalized so you and your baby can be monitored closely. You may also need to be given medicine to lower your blood pressure. This medicine may be given by mouth or through an IV.  In some cases, if your condition gets worse, you may need to deliver your baby early. Follow these instructions at home: Eating and drinking   Drink enough fluid to keep your urine pale yellow.  Avoid caffeine.  Lifestyle  Do not use any products that contain nicotine or tobacco, such as cigarettes, e-cigarettes, and chewing tobacco. If you need help quitting, ask your health care provider.  Do not use alcohol or drugs.  Avoid stress as much as possible.  Rest and get plenty of sleep.  Regular exercise can help to reduce your blood pressure. Ask your health care provider what kinds of exercise are best for you. General instructions  Take over-the-counter and prescription medicines only as told by your health care provider.  Keep all prenatal  and follow-up visits as told by your health care provider. This is important. Contact a health care provider if:  You have symptoms that your health care provider told you may require more treatment or monitoring, such as: ? Headaches. ? Nausea or vomiting. ? Abdominal pain. ? Dizziness. ? Light-headedness. Get help right away if:  You have: ? Severe abdominal pain that does not get better with treatment. ? A severe headache that does not get better. ? Vomiting that does not get better. ? Sudden, rapid weight gain. ? Sudden swelling in your hands, ankles, or face. ? Vaginal bleeding. ? Blood in your urine. ? Blurred or double vision. ? Shortness of breath or chest pain. ? Weakness on one side of your body. ? Difficulty speaking.  Your baby is not moving as much as usual. Summary  High blood pressure (hypertension) is when the force of blood pumping through the arteries is too strong.  Hypertension during pregnancy can cause problems for you and your baby.  Treatment for hypertension during pregnancy varies depending on the type of hypertension you have and how serious it is.  Keep all prenatal and follow-up visits as told by your health care provider. This is important. This information is not intended to replace advice given to you by your health care provider. Make sure you discuss any questions you have with your health care provider. Document Released: 08/13/2011 Document Revised: 03/18/2019 Document Reviewed: 12/22/2018 Elsevier Patient Education  2020 [employer].

## 2019-10-05 LAB — GC/CHLAMYDIA PROBE AMP (~~LOC~~) NOT AT ARMC
Chlamydia: NEGATIVE
Comment: NEGATIVE
Comment: NORMAL
Neisseria Gonorrhea: NEGATIVE

## 2019-10-19 ENCOUNTER — Ambulatory Visit (HOSPITAL_COMMUNITY)
Admission: RE | Admit: 2019-10-19 | Discharge: 2019-10-19 | Disposition: A | Discharge status: Home / Self Care | Payer: Medicaid Other | Source: Ambulatory Visit | Attending: Student | Admitting: Student

## 2019-10-19 ENCOUNTER — Ambulatory Visit (INDEPENDENT_AMBULATORY_CARE_PROVIDER_SITE_OTHER): Payer: Medicaid Other | Admitting: General Practice

## 2019-10-19 ENCOUNTER — Other Ambulatory Visit: Payer: Self-pay

## 2019-10-19 DIAGNOSIS — Z3491 Encounter for supervision of normal pregnancy, unspecified, first trimester: Secondary | ICD-10-CM | POA: Diagnosis present

## 2019-10-19 DIAGNOSIS — R109 Unspecified abdominal pain: Secondary | ICD-10-CM | POA: Insufficient documentation

## 2019-10-19 DIAGNOSIS — B9689 Other specified bacterial agents as the cause of diseases classified elsewhere: Secondary | ICD-10-CM | POA: Insufficient documentation

## 2019-10-19 DIAGNOSIS — N760 Acute vaginitis: Secondary | ICD-10-CM | POA: Insufficient documentation

## 2019-10-19 DIAGNOSIS — Z712 Person consulting for explanation of examination or test findings: Secondary | ICD-10-CM

## 2019-10-19 DIAGNOSIS — O26891 Other specified pregnancy related conditions, first trimester: Secondary | ICD-10-CM | POA: Diagnosis present

## 2019-10-19 DIAGNOSIS — O10911 Unspecified pre-existing hypertension complicating pregnancy, first trimester: Secondary | ICD-10-CM

## 2019-10-19 NOTE — Progress Notes (Signed)
Patient presents to office today for viability ultrasound results. Reviewed results with Maye Hides who finds single living IUP, patient should begin prenatal care.   Informed patient of results, reviewed dating, & provided pictures. Patient already has new OB appt scheduled 12/2 & will follow up then. Patient verbalized understanding to all & had no questions.  Koren Bound RN BSN 10/19/19

## 2019-10-26 ENCOUNTER — Telehealth: Payer: Self-pay | Admitting: Obstetrics and Gynecology

## 2019-10-26 NOTE — Telephone Encounter (Signed)
Attempted to call patient about her appointment on 11/18 @ 10:30. No answer, voicemail was left instructing patient that the visit is a telephone visit and she does not have to come to the office. Patient instructed to be available around her appointment time. Patient instructed to give the office a call back if she is needing to reschedule.

## 2019-10-27 ENCOUNTER — Ambulatory Visit (INDEPENDENT_AMBULATORY_CARE_PROVIDER_SITE_OTHER): Payer: Medicaid Other | Admitting: *Deleted

## 2019-10-27 ENCOUNTER — Encounter (HOSPITAL_COMMUNITY): Payer: Self-pay

## 2019-10-27 ENCOUNTER — Inpatient Hospital Stay (HOSPITAL_COMMUNITY)
Admission: AD | Admit: 2019-10-27 | Discharge: 2019-10-27 | Discharge status: Against Medical Advice | Payer: Medicaid Other | Attending: Obstetrics & Gynecology | Admitting: Obstetrics & Gynecology

## 2019-10-27 ENCOUNTER — Other Ambulatory Visit: Payer: Self-pay

## 2019-10-27 DIAGNOSIS — Z5329 Procedure and treatment not carried out because of patient's decision for other reasons: Secondary | ICD-10-CM | POA: Diagnosis not present

## 2019-10-27 DIAGNOSIS — Z3A08 8 weeks gestation of pregnancy: Secondary | ICD-10-CM | POA: Insufficient documentation

## 2019-10-27 DIAGNOSIS — Z8249 Family history of ischemic heart disease and other diseases of the circulatory system: Secondary | ICD-10-CM | POA: Diagnosis not present

## 2019-10-27 DIAGNOSIS — R519 Headache, unspecified: Secondary | ICD-10-CM | POA: Diagnosis not present

## 2019-10-27 DIAGNOSIS — O161 Unspecified maternal hypertension, first trimester: Secondary | ICD-10-CM | POA: Diagnosis not present

## 2019-10-27 DIAGNOSIS — O26891 Other specified pregnancy related conditions, first trimester: Secondary | ICD-10-CM | POA: Diagnosis present

## 2019-10-27 DIAGNOSIS — Z87891 Personal history of nicotine dependence: Secondary | ICD-10-CM | POA: Diagnosis not present

## 2019-10-27 DIAGNOSIS — O169 Unspecified maternal hypertension, unspecified trimester: Secondary | ICD-10-CM

## 2019-10-27 DIAGNOSIS — Z881 Allergy status to other antibiotic agents status: Secondary | ICD-10-CM | POA: Diagnosis not present

## 2019-10-27 DIAGNOSIS — Z79899 Other long term (current) drug therapy: Secondary | ICD-10-CM | POA: Diagnosis not present

## 2019-10-27 DIAGNOSIS — O0990 Supervision of high risk pregnancy, unspecified, unspecified trimester: Secondary | ICD-10-CM | POA: Insufficient documentation

## 2019-10-27 HISTORY — DX: Unspecified maternal hypertension, unspecified trimester: O16.9

## 2019-10-27 LAB — URINALYSIS, ROUTINE W REFLEX MICROSCOPIC
Bilirubin Urine: NEGATIVE
Glucose, UA: NEGATIVE mg/dL
Hgb urine dipstick: NEGATIVE
Ketones, ur: NEGATIVE mg/dL
Leukocytes,Ua: NEGATIVE
Nitrite: NEGATIVE
Protein, ur: NEGATIVE mg/dL
Specific Gravity, Urine: 1.025 (ref 1.005–1.030)
pH: 7 (ref 5.0–8.0)

## 2019-10-27 MED ORDER — SODIUM CHLORIDE 0.9 % IV SOLN
INTRAVENOUS | Status: DC
  Administered 2019-10-27: 21:00:00 via INTRAVENOUS

## 2019-10-27 MED ORDER — METOCLOPRAMIDE HCL 5 MG/ML IJ SOLN
10.0000 mg | Freq: Once | INTRAMUSCULAR | Status: AC
  Administered 2019-10-27: 10 mg via INTRAVENOUS
  Filled 2019-10-27: qty 2

## 2019-10-27 MED ORDER — DEXAMETHASONE SODIUM PHOSPHATE 10 MG/ML IJ SOLN
10.0000 mg | Freq: Once | INTRAMUSCULAR | Status: AC
  Administered 2019-10-27: 10 mg via INTRAVENOUS
  Filled 2019-10-27: qty 1

## 2019-10-27 MED ORDER — LABETALOL HCL 100 MG PO TABS
200.0000 mg | ORAL_TABLET | Freq: Once | ORAL | Status: DC
  Filled 2019-10-27 (×2): qty 2

## 2019-10-27 MED ORDER — DIPHENHYDRAMINE HCL 50 MG/ML IJ SOLN
25.0000 mg | Freq: Once | INTRAMUSCULAR | Status: AC
  Administered 2019-10-27: 25 mg via INTRAVENOUS
  Filled 2019-10-27: qty 1

## 2019-10-27 NOTE — MAU Note (Signed)
Went to give pt her Labetalol but patient not in room, IV cath and its tape lying on the floor.  Went out to lobby but pt said she was leaving  because there is an emergency with her son.  Patient said she will pick up her Labetalol prescription tomorrow. Reports her headache is now a "3" on pain scale.  Reports someone is picking her up. Notified Jorje Guild NP.

## 2019-10-27 NOTE — Progress Notes (Signed)
I connected with  Angela Manning on 10/27/19 at 10:30 AM EST by telephone and verified that I am speaking with the correct person using two identifiers.   I discussed the limitations, risks, security and privacy concerns of performing an evaluation and management service by telephone and the availability of in person appointments. I also discussed with the patient that there may be a patient responsible charge related to this service. The patient expressed understanding and agreed to proceed.  I did her  covid screen and she does report she has had a severe headache since yesterday. States she took tylenol and rested and did not relieve headache- still severe, this am still severe. Does have history of HTN. Does report she was prescribed labetolol when she went to MAU 10/03/19  but she was unable to get any of her prescriptions because she did not have the money. I advised her to go to the hospital now for evaluation to [facility] because I was concerned her blood pressure could be dangerously high and that I would call her back at 3:30 to complete her visit today. She agreed she will go to the hospital now for evaluation.  Linda,RN 10/27/2019  10:25 AM   3:39 per chart review I do not see that [name] has went to [facility] or anywhere in [facility].  I called [name] to complete her telephone visit and left a message I was calling back to complete her  telephone visit- I hope you have gotten checked and are ok; I will call again in a few minutes- please be available by phone. Linda,RN  3:44 I called [name] to complete her telephone visit and left a message I am calling to complete her telephone visit and since I did not connect with you; you will need to call to reschedule your appointment. I did call [facility] and they verified they received order for labetolol and clindamycin ; but she has not picked them up.   Linda,RN

## 2019-10-27 NOTE — MAU Note (Signed)
Have had h/a since yesterday. Hurts top of head and down back of head and down neck. If I turn my head it hurts more down back of neck. Took Tylenol but did not help. I am on new B/P med for the last wk but do not know the name of it. Take it twice daily and took am dose but not pm dose yet. Also having some mild abd pain and think I have BV. Have white d/c with odor like BV.

## 2019-10-27 NOTE — MAU Provider Note (Signed)
Chief Complaint: Headache   First Provider Initiated Contact with Patient 10/27/19 2043     SUBJECTIVE HPI: Angela Manning is a 33 y.o. [id number] at [redacted]w[redacted]d who presents to [facility] reporting headache. Headache started last night. Left frontal headache that is worse when she turns her head. It runs down the back of her neck. Has had headaches like this before & has previously received the "migraine cocktail" in the ER which helped her symptoms. Took 2 ES tylenol yesterday and this morning without relief.  Does have chronic hypertension & was recently prescribed labetalol for her BP but hasn't picked up her prescription yet due to cost issues. Had some norvasc left over from previous rx so started taking that yesterday.  Reports continued vaginal discharge. Was diagnosed with BV last month & states she knows she still has it. Was prescribed clindamycin (due to flagyl allergy) during that visit but was unable to pick it up due to cost. States she will be able to pick up both of her meds tomorrow.  Denies lower abdominal pain or vaginal bleeding.   Location: head Quality: pressure, throbbing Severity: 7/10 on pain scale Duration: 2 days Timing: constant Modifying factors: worse with movement Associated signs and symptoms: none  Past Medical History:  Diagnosis Date  . Bacterial vaginosis   . Ovarian cyst   . Pregnancy induced hypertension    OB History  Gravida Para Term Preterm AB Living  5 4 3 1   4   SAB TAB Ectopic Multiple Live Births          4    # Outcome Date GA Lbr Len/2nd Weight Sex Delivery Anes PTL Lv  5 Current           4 Term      Vag-Spont   LIV  3 Term      Vag-Spont   LIV  2 Term      Vag-Spont   LIV  1 Preterm      Vag-Spont   LIV   Past Surgical History:  Procedure Laterality Date  . DILATION AND EVACUATION N/A 02/21/2013   Procedure: DILATATION AND EVACUATION;  Surgeon: 02/23/2013, MD;  Location: [facility];  Service: Gynecology;  Laterality: N/A;    Social History   Socioeconomic History  . Marital status: Single    Spouse name: Not on file  . Number of children: Not on file  . Years of education: Not on file  . Highest education level: Not on file  Occupational History  . Not on file  Social Needs  . Financial resource strain: Not on file  . Food insecurity    Worry: Not on file    Inability: Not on file  . Transportation needs    Medical: Not on file    Non-medical: Not on file  Tobacco Use  . Smoking status: Former Smoker    Packs/day: 0.25    Types: Cigarettes  . Smokeless tobacco: Former Reva Bores    Quit date: 01/08/2015  Substance and Sexual Activity  . Alcohol use: Not Currently    Alcohol/week: 0.0 standard drinks    Comment: occasionally  . Drug use: No  . Sexual activity: Yes    Partners: Male  Lifestyle  . Physical activity    Days per week: Not on file    Minutes per session: Not on file  . Stress: Not on file  Relationships  . Social connections    Talks on phone: Not on file  Gets together: Not on file    Attends religious service: Not on file    Active member of club or organization: Not on file    Attends meetings of clubs or organizations: Not on file    Relationship status: Not on file  . Intimate partner violence    Fear of current or ex partner: Not on file    Emotionally abused: Not on file    Physically abused: Not on file    Forced sexual activity: Not on file  Other Topics Concern  . Not on file  Social History Narrative  . Not on file   Family History  Problem Relation Age of Onset  . Hypertension Mother   . Healthy Father    No current facility-administered medications on file prior to encounter.    Current Outpatient Medications on File Prior to Encounter  Medication Sig Dispense Refill  . acetaminophen (TYLENOL) 500 MG tablet Take 1,000 mg by mouth every 6 (six) hours as needed.    . clindamycin (CLEOCIN) 300 MG capsule Take 1 capsule (300 mg total) by mouth 2 (two)  times daily. For seven days (Patient not taking: Reported on 10/27/2019) 14 capsule 0  . Doxylamine-Pyridoxine 10-10 MG TBEC Take 2 tablets by mouth at bedtime. (Patient not taking: Reported on 10/27/2019) 60 tablet 0  . labetalol (NORMODYNE) 200 MG tablet Take 1 tablet (200 mg total) by mouth 2 (two) times daily. (Patient not taking: Reported on 10/27/2019) 60 tablet 0  . Prenatal MV & Min w/FA-DHA (PRENATAL ADULT GUMMY/DHA/FA) 0.4-25 MG CHEW Chew 1 tablet by mouth daily. (Patient not taking: Reported on 10/27/2019) 60 tablet 0  . [DISCONTINUED] norethindrone (MICRONOR,CAMILA,ERRIN) 0.35 MG tablet Take 1 tablet (0.35 mg total) by mouth daily. (Patient not taking: Reported on 08/23/2019) 1 Package 11   Allergies  Allergen Reactions  . Flagyl [Metronidazole] Rash    "Breaks out in blisters"    I have reviewed patient's Past Medical Hx, Surgical Hx, Family Hx, Social Hx, medications and allergies.   Review of Systems  Constitutional: Negative.   Eyes: Negative for photophobia and visual disturbance.  Gastrointestinal: Negative.   Genitourinary: Positive for vaginal discharge. Negative for vaginal bleeding.  Neurological: Positive for headaches.    OBJECTIVE Patient Vitals for the past 24 hrs:  BP Temp Pulse Resp SpO2 Height Weight  10/27/19 2015 (!) 160/92 - 82 - 100 % - -  10/27/19 2014 - 98.5 F (36.9 C) - 18 - 5\' 7"  (1.702 m) 60.8 kg   Constitutional: Well-developed, well-nourished female in no acute distress.  Cardiovascular: normal rate & rhythm, no murmur Respiratory: normal rate and effort. Lung sounds clear throughout GI: Abd soft, non-tender, Pos BS x 4. No guarding or rebound tenderness MS: Extremities nontender, no edema, normal ROM Neurologic: Alert and oriented x 4.   LAB RESULTS Results for orders placed or performed during the hospital encounter of 10/27/19 (from the past 24 hour(s))  Urinalysis, Routine w reflex microscopic     Status: Abnormal   Collection Time:  10/27/19  8:45 PM  Result Value Ref Range   Color, Urine YELLOW YELLOW   APPearance HAZY (A) CLEAR   Specific Gravity, Urine 1.025 1.005 - 1.030   pH 7.0 5.0 - 8.0   Glucose, UA NEGATIVE NEGATIVE mg/dL   Hgb urine dipstick NEGATIVE NEGATIVE   Bilirubin Urine NEGATIVE NEGATIVE   Ketones, ur NEGATIVE NEGATIVE mg/dL   Protein, ur NEGATIVE NEGATIVE mg/dL   Nitrite NEGATIVE NEGATIVE   Leukocytes,Ua  NEGATIVE NEGATIVE    IMAGING No results found.  MAU COURSE Orders Placed This Encounter  Procedures  . Urinalysis, Routine w reflex microscopic  . Insert peripheral IV   Meds ordered this encounter  Medications  . labetalol (NORMODYNE) tablet 200 mg  . AND Linked Order Group   . 0.9 %  sodium chloride infusion   . diphenhydrAMINE (BENADRYL) injection 25 mg   . metoCLOPramide (REGLAN) injection 10 mg   . dexamethasone (DECADRON) injection 10 mg    MDM IV headache cocktail ordered (decadron, reglan, benadryl) and an LR bolus  Labetalol 200 mg PO ordered. Pt states she will fill labetalol prescription tomorrow  Pt informed that the ultrasound is considered a limited OB ultrasound and is not intended to be a complete ultrasound exam.  Patient also informed that the ultrasound is not being completed with the intent of assessing for fetal or placental anomalies or any pelvic abnormalities.  Explained that the purpose of today's ultrasound is to assess for  viability.  Patient acknowledges the purpose of the exam and the limitations of the study.  Live IUP, FHR 176 bpm     ASSESSMENT 1. Pregnancy headache in first trimester   2. Hypertension affecting pregnancy, antepartum   3. [redacted] weeks gestation of pregnancy   4. Left against medical advice     PLAN  Patient removed her IV & left prior to receiving her oral labetalol or being reevaluated. Told the nurse as she was leaving that she had an emergency with her son. Will document as AMA since we weren't able to reevaluate or reassess her  blood pressure.    Jorje Guild, NP 10/27/2019  9:38 PM

## 2019-11-10 ENCOUNTER — Encounter: Payer: Self-pay | Admitting: Family Medicine

## 2019-11-10 ENCOUNTER — Encounter: Payer: Medicaid Other | Admitting: Obstetrics and Gynecology

## 2019-11-14 DIAGNOSIS — H1013 Acute atopic conjunctivitis, bilateral: Secondary | ICD-10-CM | POA: Diagnosis not present

## 2019-12-28 ENCOUNTER — Other Ambulatory Visit: Payer: Self-pay

## 2019-12-28 ENCOUNTER — Ambulatory Visit (HOSPITAL_COMMUNITY)
Admission: EM | Admit: 2019-12-28 | Discharge: 2019-12-28 | Disposition: A | Discharge status: Home / Self Care | Payer: Medicaid Other | Attending: Family Medicine | Admitting: Family Medicine

## 2019-12-28 ENCOUNTER — Encounter (HOSPITAL_COMMUNITY): Payer: Self-pay | Admitting: Emergency Medicine

## 2019-12-28 DIAGNOSIS — Z3202 Encounter for pregnancy test, result negative: Secondary | ICD-10-CM | POA: Diagnosis not present

## 2019-12-28 DIAGNOSIS — N760 Acute vaginitis: Secondary | ICD-10-CM | POA: Insufficient documentation

## 2019-12-28 DIAGNOSIS — B9689 Other specified bacterial agents as the cause of diseases classified elsewhere: Secondary | ICD-10-CM | POA: Diagnosis not present

## 2019-12-28 DIAGNOSIS — Z113 Encounter for screening for infections with a predominantly sexual mode of transmission: Secondary | ICD-10-CM | POA: Diagnosis not present

## 2019-12-28 LAB — POCT URINALYSIS DIP (DEVICE)
Bilirubin Urine: NEGATIVE
Glucose, UA: NEGATIVE mg/dL
Hgb urine dipstick: NEGATIVE
Ketones, ur: NEGATIVE mg/dL
Leukocytes,Ua: NEGATIVE
Nitrite: NEGATIVE
Protein, ur: NEGATIVE mg/dL
Specific Gravity, Urine: >=1.03 (ref 1.005–1.030)
Urobilinogen, UA: 0.2 mg/dL (ref 0.0–1.0)
pH: 6 (ref 5.0–8.0)

## 2019-12-28 LAB — POCT PREGNANCY, URINE: Preg Test, Ur: NEGATIVE

## 2019-12-28 LAB — POC URINE PREG, ED: Preg Test, Ur: NEGATIVE

## 2019-12-28 MED ORDER — CLINDAMYCIN HCL 300 MG PO CAPS: 300.0000 mg | ORAL_CAPSULE | Freq: Two times a day (BID) | ORAL | 0 refills | Status: DC

## 2019-12-28 NOTE — ED Provider Notes (Signed)
[facility]    CSN: [id number] Arrival date & time: 12/28/19  1004      History   Chief Complaint Chief Complaint  Patient presents with  . Vaginal Discharge    HPI Angela Manning is a 34 y.o. female.   HPI Vaginal discharge concern. Patient has a history of recurrent bacterial vaginosis.  Patient presents for sexually transmitted disease check. Sexual history reviewed with the patient. No known STD exposure. Current symptoms include two days of white , fishy discharge consistent with prior BV infections. Patient's last menstrual period was 11/23/2019. Recently pregnant.   Past Medical History:  Diagnosis Date  . Bacterial vaginosis   . Ovarian cyst   . Pregnancy induced hypertension     Patient Active Problem List   Diagnosis Date Noted  . Supervision of high risk pregnancy, antepartum 10/27/2019  . Hypertension affecting pregnancy, antepartum 10/27/2019  . Nausea 05/09/2017  . Elevated blood pressure reading 05/09/2017  . Elevated serum hCG 03/12/2016  . Ovarian cyst, left 03/01/2015  . Abnormal uterine bleeding (AUB) 03/01/2015  . Dyspareunia 03/01/2015  . Smoker 10/23/2014  . Retained products of conception with hemorrhage 02/21/2013    Past Surgical History:  Procedure Laterality Date  . DILATION AND EVACUATION N/A 02/21/2013   Procedure: DILATATION AND EVACUATION;  Surgeon: Reva Bores, MD;  Location: [facility];  Service: Gynecology;  Laterality: N/A;    OB History    Gravida  5   Para  4   Term  3   Preterm  1   AB      Living  4     SAB      TAB      Ectopic      Multiple      Live Births  4            Home Medications    Prior to Admission medications   Medication Sig Start Date End Date Taking? Authorizing Provider  acetaminophen (TYLENOL) 500 MG tablet Take 1,000 mg by mouth every 6 (six) hours as needed.    [provider]  clindamycin (CLEOCIN) 300 MG capsule Take 1 capsule (300 mg total) by mouth 2  (two) times daily. For seven days Patient not taking: Reported on 10/27/2019 10/03/19   Judeth Horn, NP  Doxylamine-Pyridoxine 10-10 MG TBEC Take 2 tablets by mouth at bedtime. Patient not taking: Reported on 10/27/2019 09/29/19   Wieters, Hallie C, PA-C  labetalol (NORMODYNE) 200 MG tablet Take 1 tablet (200 mg total) by mouth 2 (two) times daily. Patient not taking: Reported on 10/27/2019 10/03/19   Judeth Horn, NP  Prenatal MV & Min w/FA-DHA (PRENATAL ADULT GUMMY/DHA/FA) 0.4-25 MG CHEW Chew 1 tablet by mouth daily. Patient not taking: Reported on 10/27/2019 09/29/19   Wieters, Hallie C, PA-C  norethindrone (MICRONOR,CAMILA,ERRIN) 0.35 MG tablet Take 1 tablet (0.35 mg total) by mouth daily. Patient not taking: Reported on 08/23/2019 10/07/18 09/29/19  Conan Bowens, MD    Family History Family History  Problem Relation Age of Onset  . Hypertension Mother   . Healthy Father     Social History Social History   Tobacco Use  . Smoking status: Former Smoker    Packs/day: 0.25    Types: Cigarettes  . Smokeless tobacco: Former Neurosurgeon    Quit date: 01/08/2015  Substance Use Topics  . Alcohol use: Not Currently    Alcohol/week: 0.0 standard drinks    Comment: occasionally  . Drug use: No  Allergies   Flagyl [metronidazole]   Review of Systems Review of Systems Pertinent negatives listed in HPI Physical Exam Triage Vital Signs ED Triage Vitals  Enc Vitals Group     BP 12/28/19 1039 (!) 158/92     Pulse Rate 12/28/19 1039 78     Resp 12/28/19 1039 18     Temp 12/28/19 1039 98.4 F (36.9 C)     Temp Source 12/28/19 1039 Oral     SpO2 12/28/19 1039 100 %     Weight --      Height --      Head Circumference --      Peak Flow --      Pain Score 12/28/19 1040 0     Pain Loc --      Pain Edu? --      Excl. in [place]? --    No data found.  Updated Vital Signs BP (!) 158/92 (BP Location: Right Arm)   Pulse 78   Temp 98.4 F (36.9 C) (Oral)   Resp 18   LMP  11/23/2019   SpO2 100%   Breastfeeding Unknown   Visual Acuity Right Eye Distance:   Left Eye Distance:   Bilateral Distance:    Right Eye Near:   Left Eye Near:    Bilateral Near:     Physical Exam General appearance: alert, well developed, well nourished, cooperative and in no distress Head: Normocephalic, without obvious abnormality, atraumatic Respiratory: Respirations even and unlabored, normal respiratory rate Heart: rate and rhythm normal. No gallop or murmurs noted on exam  Extremities: No gross deformities Skin: Skin color, texture, turgor normal. No rashes seen  Psych: Appropriate mood and affect. Neurologic: Alert, oriented to person, place, and time, thought content appropriate.  UC Treatments / Results  Labs (all labs ordered are listed, but only abnormal results are displayed) Labs Reviewed - No data to display  EKG   Radiology No results found.  Procedures Procedures (including critical care time)  Medications Ordered in UC Medications - No data to display  Initial Impression / Assessment and Plan / UC Course  I have reviewed the triage vital signs and the nursing notes.  Pertinent labs & imaging results that were available during my care of the patient were reviewed by me and considered in my medical decision making (see chart for details).    History of recurrent bacterial vaginosis. Treating with clindamycin 300 mg BID x 7 days due to metronidazole allergy. Vaginal STD specimen panel pending. No concern today for exposure to STD. Urine pregnancy negative. UA negative. Final Clinical Impressions(s) / UC Diagnoses   Final diagnoses:  BV (bacterial vaginosis)  Screen for STD (sexually transmitted disease)   Discharge Instructions   None    ED Prescriptions    Medication Sig Dispense Auth. Provider   clindamycin (CLEOCIN) 300 MG capsule Take 1 capsule (300 mg total) by mouth 2 (two) times daily. For seven days 14 capsule Scot Jun, FNP      PDMP not reviewed this encounter.   Scot Jun, FNP 12/28/19 1140

## 2019-12-28 NOTE — ED Triage Notes (Signed)
Pt here with vaginal discharge with hx of BV; pt sts hx of similar in past; pt has not taken htn meds today

## 2019-12-30 LAB — CERVICOVAGINAL ANCILLARY ONLY
Bacterial vaginitis: POSITIVE — AB
Candida vaginitis: NEGATIVE
Chlamydia: NEGATIVE
Neisseria Gonorrhea: NEGATIVE
Trichomonas: NEGATIVE

## 2020-03-02 ENCOUNTER — Ambulatory Visit: Payer: Medicaid Other | Admitting: Internal Medicine

## 2020-03-02 ENCOUNTER — Other Ambulatory Visit: Payer: Self-pay

## 2020-03-02 VITALS — BP 160/121 | HR 72 | Temp 98.1°F | Ht 66.0 in | Wt 135.3 lb

## 2020-03-02 DIAGNOSIS — Z881 Allergy status to other antibiotic agents status: Secondary | ICD-10-CM

## 2020-03-02 DIAGNOSIS — F419 Anxiety disorder, unspecified: Secondary | ICD-10-CM | POA: Diagnosis not present

## 2020-03-02 DIAGNOSIS — B9689 Other specified bacterial agents as the cause of diseases classified elsewhere: Secondary | ICD-10-CM

## 2020-03-02 DIAGNOSIS — Z87891 Personal history of nicotine dependence: Secondary | ICD-10-CM | POA: Diagnosis not present

## 2020-03-02 DIAGNOSIS — I10 Essential (primary) hypertension: Secondary | ICD-10-CM

## 2020-03-02 DIAGNOSIS — N760 Acute vaginitis: Secondary | ICD-10-CM

## 2020-03-02 DIAGNOSIS — F411 Generalized anxiety disorder: Secondary | ICD-10-CM

## 2020-03-02 MED ORDER — HYDROCHLOROTHIAZIDE 25 MG PO TABS: 25.0000 mg | ORAL_TABLET | Freq: Every day | ORAL | 0 refills | Status: DC

## 2020-03-02 MED ORDER — CLINDAMYCIN HCL 300 MG PO CAPS: 300.0000 mg | ORAL_CAPSULE | Freq: Two times a day (BID) | ORAL | 0 refills | Status: DC

## 2020-03-02 MED ORDER — FLUOXETINE HCL 10 MG PO CAPS: 10.0000 mg | ORAL_CAPSULE | Freq: Every day | ORAL | 0 refills | Status: DC

## 2020-03-02 NOTE — Progress Notes (Signed)
CC: establish care   HPI:[name] is a 33 y.o. female who presents for evaluation of anxiety, hypertension and vaginal discharge with odor. Please see individual problem based A/P for details.   Depression, PHQ-9: Based on the patients    Office Visit from 03/02/2020 in [facility]  PHQ-9 Total Score  8      Past Medical History:  Diagnosis Date  . Bacterial vaginosis   . Ovarian cyst   . Pregnancy induced hypertension    Past Surgical History:  Procedure Laterality Date  . DILATION AND EVACUATION N/A 02/21/2013   Procedure: DILATATION AND EVACUATION;  Surgeon: Donnamae Jude, MD;  Location: [facility];  Service: Gynecology;  Laterality: N/A;   Current Outpatient Medications on File Prior to Visit  Medication Sig Dispense Refill  . acetaminophen (TYLENOL) 500 MG tablet Take 1,000 mg by mouth every 6 (six) hours as needed.    . Doxylamine-Pyridoxine 10-10 MG TBEC Take 2 tablets by mouth at bedtime. (Patient not taking: Reported on 10/27/2019) 60 tablet 0  . labetalol (NORMODYNE) 200 MG tablet Take 1 tablet (200 mg total) by mouth 2 (two) times daily. (Patient not taking: Reported on 10/27/2019) 60 tablet 0  . Prenatal MV & Min w/FA-DHA (PRENATAL ADULT GUMMY/DHA/FA) 0.4-25 MG CHEW Chew 1 tablet by mouth daily. (Patient not taking: Reported on 10/27/2019) 60 tablet 0  . [DISCONTINUED] norethindrone (MICRONOR,CAMILA,ERRIN) 0.35 MG tablet Take 1 tablet (0.35 mg total) by mouth daily. (Patient not taking: Reported on 08/23/2019) 1 Package 11   No current facility-administered medications on file prior to visit.    Allergies  Allergen Reactions  . Flagyl [Metronidazole] Rash    "Breaks out in blisters"   Family History  Problem Relation Age of Onset  . Hypertension Mother   . Healthy Father    Social History   Tobacco Use  . Smoking status: Former Smoker    Packs/day: 0.25    Types: Cigarettes  . Smokeless tobacco: Former Systems developer    Quit date:  01/08/2015  Substance Use Topics  . Alcohol use: Not Currently    Alcohol/week: 0.0 standard drinks    Comment: occasionally  . Drug use: No      Review of Systems:  Review of Systems  Constitutional: Negative for chills and fever.  HENT: Negative for congestion and sinus pain.   Eyes: Negative for blurred vision and double vision.  Respiratory: Negative for cough and sputum production.   Cardiovascular: Negative for chest pain, palpitations, orthopnea, leg swelling and PND.  Gastrointestinal: Negative for nausea and vomiting.  Genitourinary: Negative for dysuria and urgency.  Musculoskeletal: Negative for back pain and falls.  Skin: Negative for itching and rash.  Neurological: Negative for tremors and weakness.  Endo/Heme/Allergies: Negative for polydipsia. Does not bruise/bleed easily.  Psychiatric/Behavioral: Negative for depression and suicidal ideas. The patient is nervous/anxious.      Physical Exam: Vitals:   03/02/20 1433  BP: (!) 160/121  Pulse: 72  Temp: 98.1 F (36.7 C)  TempSrc: Oral  SpO2: 100%  Weight: 135 lb 4.8 oz (61.4 kg)  Height: 5\' 6"  (1.676 m)    General: NAD, nl appearance HE: Normocephalic, atraumatic , EOMI, Conjunctivae normal ENT: No congestion, no rhinorrhea, no exudate or erythema  Cardiovascular: Normal rate, regular rhythm.  No murmurs, rubs, or gallops Pulmonary : Effort normal, breath sounds normal. No wheezes, rales, or rhonchi Abdominal: soft, nontender,  bowel sounds present Musculoskeletal: no swelling , deformity, injury ,or  tenderness in extremities Skin: Warm, dry , no bruising, erythema, or rash Psychiatric/Behavioral:  normal mood, normal behavior   Assessment & Plan:   See Encounters Tab for problem based charting.  Patient discussed with Dr. Antony Contras

## 2020-03-02 NOTE — Patient Instructions (Addendum)
Thank you for trusting me with your care. To recap, today we discussed the following:   High blood pressure I started hydrochlorothiazide 25 mg and will consider adding more medication when you next visit if your blood pressure is elevated  Anxiety Prescribed fluoxetine 10 mg, monitor for GI or sexual side effects.   Bacterial vaginosis Prescribed clindamycin and will follow up at your next visit.  We will perform a Pap smear then and if you are having symptoms we would test you for other infections.  If you continue to have infections we can consider having you seen by the OB/GYN doctor for further evaluation  My best,  Thurmon Fair, MD

## 2020-03-03 DIAGNOSIS — I10 Essential (primary) hypertension: Secondary | ICD-10-CM

## 2020-03-03 DIAGNOSIS — N760 Acute vaginitis: Secondary | ICD-10-CM | POA: Insufficient documentation

## 2020-03-03 DIAGNOSIS — B9689 Other specified bacterial agents as the cause of diseases classified elsewhere: Secondary | ICD-10-CM | POA: Insufficient documentation

## 2020-03-03 DIAGNOSIS — F419 Anxiety disorder, unspecified: Secondary | ICD-10-CM | POA: Insufficient documentation

## 2020-03-03 HISTORY — DX: Essential (primary) hypertension: I10

## 2020-03-03 NOTE — Assessment & Plan Note (Signed)
GAD-7 score 18, represents severe level of anxiety. Patient reports she has always had a good bit of anxiety , but recently it has been really bad. She is in cosmetology school, under a lot of stress, and it makes it hard to fall asleep. Finds herself sitting in bed worrying at night and can not shut off her thoughts. She has never been on medications for anxiety. Recommend starting a workout routine to help with her stress. She is motivated as her mother and sister have had heart problems recently. She is unsure of their specific diagnosis , but will share at her next visit.   Plan: - start Prozac 10 mg, consider adjusting in 4 weeks

## 2020-03-03 NOTE — Addendum Note (Signed)
Addended by: Gardenia Phlegm on: 03/03/2020 04:18 PM   Modules accepted: Orders

## 2020-03-03 NOTE — Assessment & Plan Note (Signed)
Hypertension: Patient's BP today is 160/121 with a goal of <140/80. The patient has been off of her previous medication regimen of HCTZ 50mg   And amlodipine 2.5 mg. She denied, chest pain, headache, or visual changes.   Plan to start one medication for blood pressure today. At follow up in 4 weeks patient will need BMP and would consider additional medication if patient's blood pressure is still elevated. I was considering adding lisinopril - HCTZ combination pill which is covered on [place] medicaid.   Plan: - Start HCTZ 25 mg

## 2020-03-03 NOTE — Assessment & Plan Note (Addendum)
Patient presents with symptoms familiar to her when she was diagnosed with bacterial vaginosis. Minimal vaginal discharge, but the odor is coming back. Deferred vaginal exam, as patient has recently been diagnosed and has consistent symptoms. In 2020, patient was treated 3 times for bacterial vaginosis.   Patient had abnormal pap smear in 2019 , and she did not follow up for colposcopy. On next visit in 4 weeks patient should have repeat pap smear with HPV testing. Consider getting genotype for better evaluation of patients risk. If patient is still experiencing symptoms she would need STD screening.   Plan: - Clindamycin 300mg  BID x 7 days - Office visit in 4 weeks follow up as mentioned above

## 2020-03-06 NOTE — Progress Notes (Signed)
Internal Medicine Clinic Attending ° °Case discussed with Dr. Steen  at the time of the visit.  We reviewed the resident’s history and exam and pertinent patient test results.  I agree with the assessment, diagnosis, and plan of care documented in the resident’s note.  °

## 2020-04-10 ENCOUNTER — Encounter: Payer: Medicaid Other | Admitting: Internal Medicine

## 2020-04-10 ENCOUNTER — Telehealth: Payer: Self-pay | Admitting: *Deleted

## 2020-04-10 NOTE — Telephone Encounter (Signed)
Pt did not come to her appt this afternoon. Called pt - no answer; unable to leave a message, "call cannot be completed".

## 2020-08-06 DIAGNOSIS — Z1152 Encounter for screening for COVID-19: Secondary | ICD-10-CM | POA: Diagnosis not present

## 2020-08-07 ENCOUNTER — Ambulatory Visit (HOSPITAL_COMMUNITY): Payer: Self-pay

## 2020-08-16 ENCOUNTER — Ambulatory Visit: Payer: Self-pay

## 2020-08-17 ENCOUNTER — Encounter (HOSPITAL_COMMUNITY): Payer: Self-pay

## 2020-08-17 ENCOUNTER — Other Ambulatory Visit: Payer: Self-pay

## 2020-08-17 ENCOUNTER — Ambulatory Visit (HOSPITAL_COMMUNITY)
Admission: RE | Admit: 2020-08-17 | Discharge: 2020-08-17 | Disposition: A | Discharge status: Home / Self Care | Payer: Medicaid Other | Source: Ambulatory Visit | Attending: Family Medicine | Admitting: Family Medicine

## 2020-08-17 ENCOUNTER — Ambulatory Visit (HOSPITAL_COMMUNITY): Payer: Self-pay

## 2020-08-17 VITALS — BP 154/117 | HR 75 | Temp 98.5°F | Resp 18

## 2020-08-17 DIAGNOSIS — N760 Acute vaginitis: Secondary | ICD-10-CM | POA: Insufficient documentation

## 2020-08-17 DIAGNOSIS — B9689 Other specified bacterial agents as the cause of diseases classified elsewhere: Secondary | ICD-10-CM | POA: Diagnosis not present

## 2020-08-17 MED ORDER — CLINDAMYCIN HCL 300 MG PO CAPS: 300.0000 mg | ORAL_CAPSULE | Freq: Two times a day (BID) | ORAL | 0 refills | Status: DC

## 2020-08-17 NOTE — Discharge Instructions (Signed)
Try boric acid suppositories (available over the counter) on an as needed basis to help with preventing these infections in addition to good probiotic regimen which you can get through yogurt or probiotic supplements at the drugstore

## 2020-08-17 NOTE — ED Triage Notes (Signed)
Pt presents with vaginal odor xs 2 days. Denies itching, burning, irritation.

## 2020-08-17 NOTE — ED Provider Notes (Signed)
[facility]    CSN: [id number] Arrival date & time: 08/17/20  1403      History   Chief Complaint Chief Complaint  Patient presents with  . Vaginitis    HPI Angela Manning is a 34 y.o. female.   Patient presenting today for 2 days of thin white discharge and vaginal odor. States sxs occurred after taking a bath a few days prior. Long hx of recurrent BV which has always presented this way for her. Denies pelvic pain, rashes, itching, concern for STIs or pregnancy, urinary sxs, fever, chills, abdominal pain, N/V/D. Trying to eat activia yogurt to help keep these infections from happening.      Past Medical History:  Diagnosis Date  . Bacterial vaginosis   . Ovarian cyst   . Pregnancy induced hypertension     Patient Active Problem List   Diagnosis Date Noted  . Essential hypertension 03/03/2020  . Anxiety 03/03/2020  . Bacterial vaginosis 03/03/2020  . Supervision of high risk pregnancy, antepartum 10/27/2019  . Hypertension affecting pregnancy, antepartum 10/27/2019  . Nausea 05/09/2017  . Elevated blood pressure reading 05/09/2017  . Elevated serum hCG 03/12/2016  . Ovarian cyst, left 03/01/2015  . Abnormal uterine bleeding (AUB) 03/01/2015  . Dyspareunia 03/01/2015  . Smoker 10/23/2014  . Retained products of conception with hemorrhage 02/21/2013    Past Surgical History:  Procedure Laterality Date  . DILATION AND EVACUATION N/A 02/21/2013   Procedure: DILATATION AND EVACUATION;  Surgeon: Reva Bores, MD;  Location: [facility];  Service: Gynecology;  Laterality: N/A;    OB History    Gravida  5   Para  4   Term  3   Preterm  1   AB      Living  4     SAB      TAB      Ectopic      Multiple      Live Births  4            Home Medications    Prior to Admission medications   Medication Sig Start Date End Date Taking? Authorizing Provider  clindamycin (CLEOCIN) 300 MG capsule Take 1 capsule (300 mg total) by mouth 2 (two)  times daily. For seven days 08/17/20   Particia Nearing, PA-C  FLUoxetine (PROZAC) 10 MG capsule Take 1 capsule (10 mg total) by mouth daily. 03/02/20 04/01/20  Albertha Ghee, MD  hydrochlorothiazide (HYDRODIURIL) 25 MG tablet Take 1 tablet (25 mg total) by mouth daily. 03/02/20   Albertha Ghee, MD  norethindrone (MICRONOR,CAMILA,ERRIN) 0.35 MG tablet Take 1 tablet (0.35 mg total) by mouth daily. Patient not taking: Reported on 08/23/2019 10/07/18 09/29/19  Conan Bowens, MD    Family History Family History  Problem Relation Age of Onset  . Hypertension Mother   . Healthy Father     Social History Social History   Tobacco Use  . Smoking status: Former Smoker    Packs/day: 0.25    Types: Cigarettes  . Smokeless tobacco: Former Neurosurgeon    Quit date: 01/08/2015  Vaping Use  . Vaping Use: Never used  Substance Use Topics  . Alcohol use: Not Currently    Alcohol/week: 0.0 standard drinks    Comment: occasionally  . Drug use: No     Allergies   Flagyl [metronidazole]   Review of Systems Review of Systems PER HPI   Physical Exam Triage Vital Signs ED Triage Vitals  Enc Vitals Group  BP 08/17/20 1433 (!) 154/117     Pulse Rate 08/17/20 1433 75     Resp 08/17/20 1433 18     Temp 08/17/20 1433 98.5 F (36.9 C)     Temp Source 08/17/20 1433 Oral     SpO2 08/17/20 1433 100 %     Weight --      Height --      Head Circumference --      Peak Flow --      Pain Score 08/17/20 1432 0     Pain Loc --      Pain Edu? --      Excl. in GC? --    No data found.  Updated Vital Signs BP (!) 154/117 (BP Location: Left Arm)   Pulse 75   Temp 98.5 F (36.9 C) (Oral)   Resp 18   LMP 08/07/2019   SpO2 100%   Visual Acuity Right Eye Distance:   Left Eye Distance:   Bilateral Distance:    Right Eye Near:   Left Eye Near:    Bilateral Near:     Physical Exam Vitals and nursing note reviewed.  Constitutional:      Appearance: Normal appearance. She is not  ill-appearing.  HENT:     Head: Atraumatic.  Eyes:     Extraocular Movements: Extraocular movements intact.     Conjunctiva/sclera: Conjunctivae normal.  Cardiovascular:     Rate and Rhythm: Normal rate and regular rhythm.     Heart sounds: Normal heart sounds.  Pulmonary:     Effort: Pulmonary effort is normal.     Breath sounds: Normal breath sounds.  Abdominal:     General: Bowel sounds are normal. There is no distension.     Palpations: Abdomen is soft.     Tenderness: There is no abdominal tenderness. There is no right CVA tenderness, left CVA tenderness or guarding.  Musculoskeletal:        General: Normal range of motion.     Cervical back: Normal range of motion and neck supple.  Skin:    General: Skin is warm and dry.  Neurological:     Mental Status: She is alert and oriented to person, place, and time.  Psychiatric:        Mood and Affect: Mood normal.        Thought Content: Thought content normal.        Judgment: Judgment normal.      UC Treatments / Results  Labs (all labs ordered are listed, but only abnormal results are displayed) Labs Reviewed  CERVICOVAGINAL ANCILLARY ONLY    EKG   Radiology No results found.  Procedures Procedures (including critical care time)  Medications Ordered in UC Medications - No data to display  Initial Impression / Assessment and Plan / UC Course  I have reviewed the triage vital signs and the nursing notes.  Pertinent labs & imaging results that were available during my care of the patient were reviewed by me and considered in my medical decision making (see chart for details).     Sxs and hx consistent for another BV infection, aptima swab pending, tx with clindamycin course and recommended boric acid suppositories and probiotic regimen. Reviewed good vaginal hygiene. F/u if not resolving.   Final Clinical Impressions(s) / UC Diagnoses   Final diagnoses:  BV (bacterial vaginosis)  Acute vaginitis      Discharge Instructions     Try boric acid suppositories (available over the counter) on  an as needed basis to help with preventing these infections in addition to good probiotic regimen which you can get through yogurt or probiotic supplements at the drugstore    ED Prescriptions    Medication Sig Dispense Auth. Provider   clindamycin (CLEOCIN) 300 MG capsule Take 1 capsule (300 mg total) by mouth 2 (two) times daily. For seven days 14 capsule Particia Nearing, [place]     PDMP not reviewed this encounter.   Particia Nearing, [place] 08/17/20 [phone]

## 2020-08-18 LAB — CERVICOVAGINAL ANCILLARY ONLY
Bacterial Vaginitis (gardnerella): POSITIVE — AB
Candida Glabrata: NEGATIVE
Candida Vaginitis: NEGATIVE
Chlamydia: NEGATIVE
Comment: NEGATIVE
Comment: NEGATIVE
Comment: NEGATIVE
Comment: NEGATIVE
Comment: NEGATIVE
Comment: NORMAL
Neisseria Gonorrhea: NEGATIVE
Trichomonas: POSITIVE — AB

## 2020-08-22 ENCOUNTER — Encounter: Payer: Self-pay | Admitting: Internal Medicine

## 2020-08-22 ENCOUNTER — Telehealth (HOSPITAL_COMMUNITY): Payer: Self-pay

## 2020-08-23 ENCOUNTER — Other Ambulatory Visit: Payer: Self-pay | Admitting: Internal Medicine

## 2020-08-23 MED ORDER — TINIDAZOLE 500 MG PO TABS: 2.0000 g | ORAL_TABLET | Freq: Once | ORAL | 0 refills | Status: AC

## 2020-08-23 NOTE — Progress Notes (Signed)
Ms. Fairburn was recently diagnosed with bacterial vaginosis for which she received treatment with clindamycin.  She also had trichomonas and was supposed to be treated with Flagyl however she is allergic to it.  Her allergic reaction is rash.  I had a discussion with her regarding treating her with a one-time tinidazole and she expressed understanding.  She states that she could take Benadryl for her rash.

## 2020-09-01 ENCOUNTER — Other Ambulatory Visit: Payer: Self-pay | Admitting: Internal Medicine

## 2020-09-01 DIAGNOSIS — F411 Generalized anxiety disorder: Secondary | ICD-10-CM

## 2020-09-01 DIAGNOSIS — I10 Essential (primary) hypertension: Secondary | ICD-10-CM

## 2020-09-01 MED ORDER — FLUOXETINE HCL 10 MG PO CAPS: 10.0000 mg | ORAL_CAPSULE | Freq: Every day | ORAL | 0 refills | Status: DC

## 2020-09-01 MED ORDER — HYDROCHLOROTHIAZIDE 25 MG PO TABS: 25.0000 mg | ORAL_TABLET | Freq: Every day | ORAL | 0 refills | Status: DC

## 2020-09-01 NOTE — Telephone Encounter (Signed)
I tried to call patient but none picked up. I will refill her medications but she needs a follow up appointment to adjust the dosage.

## 2020-09-08 ENCOUNTER — Telehealth: Payer: Self-pay | Admitting: *Deleted

## 2020-09-08 NOTE — Telephone Encounter (Signed)
Agree with urgent evaluation of chest pain in ED.

## 2020-09-08 NOTE — Telephone Encounter (Signed)
F/U - called pt since I was unable to get in contact with her earlier; a man answered then phone went dead.

## 2020-09-08 NOTE — Telephone Encounter (Signed)
Message sent to front office via My Chart.    Appointment Request From: Sheran Fava  With Provider: Milus Banister, MD [facility]]  Preferred Date Range: 09/11/2020 - 09/15/2020  Preferred Times: Monday Morning, Monday Afternoon, Tuesday Morning, Tuesday Afternoon, Wednesday Morning, Wednesday Afternoon, Thursday Morning, Thursday Afternoon, Friday Morning, Friday Afternoon  Reason for visit: Office Visit  Comments: I'm having chest pains and muscle spasms right under my breast on the left side      Ms Percifield If you are having chest pain please go to the emergency room as soon as possible. I called you but no answer. Thanks [employer]

## 2020-09-08 NOTE — Telephone Encounter (Signed)
I agree with urgent ED eval as well

## 2020-09-25 ENCOUNTER — Other Ambulatory Visit: Payer: Self-pay | Admitting: Student

## 2020-09-25 DIAGNOSIS — F411 Generalized anxiety disorder: Secondary | ICD-10-CM

## 2020-09-25 DIAGNOSIS — I10 Essential (primary) hypertension: Secondary | ICD-10-CM

## 2020-09-26 ENCOUNTER — Encounter: Payer: Medicaid Other | Admitting: Internal Medicine

## 2020-10-09 ENCOUNTER — Other Ambulatory Visit: Payer: Self-pay | Admitting: Student

## 2020-10-09 ENCOUNTER — Other Ambulatory Visit: Payer: Self-pay

## 2020-10-09 DIAGNOSIS — B9689 Other specified bacterial agents as the cause of diseases classified elsewhere: Secondary | ICD-10-CM

## 2020-10-09 DIAGNOSIS — I10 Essential (primary) hypertension: Secondary | ICD-10-CM

## 2020-10-09 MED ORDER — HYDROCHLOROTHIAZIDE 25 MG PO TABS: 25.0000 mg | ORAL_TABLET | Freq: Every day | ORAL | 0 refills | Status: DC

## 2020-10-11 ENCOUNTER — Ambulatory Visit (HOSPITAL_COMMUNITY): Payer: Self-pay

## 2020-10-16 ENCOUNTER — Other Ambulatory Visit: Payer: Self-pay

## 2020-10-16 ENCOUNTER — Encounter (HOSPITAL_COMMUNITY): Payer: Self-pay

## 2020-10-16 ENCOUNTER — Ambulatory Visit (HOSPITAL_COMMUNITY)
Admission: RE | Admit: 2020-10-16 | Discharge: 2020-10-16 | Disposition: A | Discharge status: Home / Self Care | Payer: Medicaid Other | Source: Ambulatory Visit | Attending: Family Medicine | Admitting: Family Medicine

## 2020-10-16 VITALS — BP 159/106 | HR 75 | Temp 98.4°F | Resp 17

## 2020-10-16 DIAGNOSIS — N898 Other specified noninflammatory disorders of vagina: Secondary | ICD-10-CM | POA: Diagnosis not present

## 2020-10-16 DIAGNOSIS — N760 Acute vaginitis: Secondary | ICD-10-CM | POA: Diagnosis not present

## 2020-10-16 DIAGNOSIS — B9689 Other specified bacterial agents as the cause of diseases classified elsewhere: Secondary | ICD-10-CM | POA: Insufficient documentation

## 2020-10-16 DIAGNOSIS — I10 Essential (primary) hypertension: Secondary | ICD-10-CM | POA: Diagnosis not present

## 2020-10-16 LAB — POCT URINALYSIS DIPSTICK, ED / UC
Bilirubin Urine: NEGATIVE
Glucose, UA: NEGATIVE mg/dL
Hgb urine dipstick: NEGATIVE
Ketones, ur: NEGATIVE mg/dL
Leukocytes,Ua: NEGATIVE
Nitrite: NEGATIVE
Protein, ur: NEGATIVE mg/dL
Specific Gravity, Urine: 1.015 (ref 1.005–1.030)
Urobilinogen, UA: 0.2 mg/dL (ref 0.0–1.0)
pH: 7.5 (ref 5.0–8.0)

## 2020-10-16 LAB — POC URINE PREG, ED: Preg Test, Ur: NEGATIVE

## 2020-10-16 MED ORDER — CLINDAMYCIN HCL 300 MG PO CAPS: 300.0000 mg | ORAL_CAPSULE | Freq: Two times a day (BID) | ORAL | 0 refills | Status: DC

## 2020-10-16 NOTE — ED Provider Notes (Signed)
[facility]   [id number] 10/16/20 Arrival Time: 1159  ASSESSMENT & PLAN:  1. Vaginal discharge   2. Elevated blood pressure reading in office with diagnosis of hypertension   3. Bacterial vaginosis     Will tx empirically for BV given history. Begin: Meds ordered this encounter  Medications  . clindamycin (CLEOCIN) 300 MG capsule    Sig: Take 1 capsule (300 mg total) by mouth 2 (two) times daily. For seven days    Dispense:  14 capsule    Refill:  0      Discharge Instructions     We have sent testing for sexually transmitted infections. We will notify you of any positive results once they are received. If required, we will prescribe any medications you might need.  Please refrain from all sexual activity for at least the next seven days.  Your blood pressure was noted to be elevated during your visit today. If you are currently taking medication for high blood pressure, please ensure you are taking this as directed. If you do not have a history of high blood pressure and your blood pressure remains persistently elevated, you may need to begin taking a medication at some point. You may return here within the next few days to recheck if unable to see your primary care provider or if do not have a one.  BP (!) 159/106 (BP Location: Right Arm)   Pulse 75   Temp 98.4 F (36.9 C) (Oral)   Resp 17   LMP 10/03/2020   SpO2 97%   Breastfeeding No      Without s/s of PID.  Labs Reviewed  POC URINE PREG, ED  POCT URINALYSIS DIPSTICK, ED / UC  CERVICOVAGINAL ANCILLARY ONLY      Will notify of any positive results. Instructed to refrain from sexual activity for at least seven days.  Reviewed expectations re: course of current medical issues. Questions answered. Outlined signs and symptoms indicating need for more acute intervention. Patient verbalized understanding. After Visit Summary given.   SUBJECTIVE:  Angela Manning is a 34 y.o. female who presents  with complaint of vaginal discharge. Onset gradual. First noticed 2 d agop. H/O BV and questions if this is BV; "but also a little different". Unprotected sex 10 d ago with partner who apparently has multiple sexual partners. Mild and occasional abd discomfort; none currently. No specific aggravating or alleviating factors reported. Denies: urinary frequency, dysuria and gross hematuria. Afebrile. No abdominal or pelvic pain. Normal PO intake wihout n/v. No genital rashes or lesions.  Patient's last menstrual period was 10/03/2020.   OBJECTIVE:  Vitals:   10/16/20 1212  BP: (!) 159/106  Pulse: 75  Resp: 17  Temp: 98.4 F (36.9 C)  TempSrc: Oral  SpO2: 97%     General appearance: alert, cooperative, appears stated age and no distress Lungs: unlabored respirations; speaks full sentences without difficulty Back: no CVA tenderness reported; FROM at waist Abdomen: soft GU: deferred Skin: warm and dry Psychological: alert and cooperative; normal mood and affect.  Results for orders placed or performed during the hospital encounter of 10/16/20  POC urine pregnancy  Result Value Ref Range   Preg Test, Ur NEGATIVE NEGATIVE  POC Urinalysis dipstick  Result Value Ref Range   Glucose, UA NEGATIVE NEGATIVE mg/dL   Bilirubin Urine NEGATIVE NEGATIVE   Ketones, ur NEGATIVE NEGATIVE mg/dL   Specific Gravity, Urine 1.015 1.005 - 1.030   Hgb urine dipstick NEGATIVE NEGATIVE   pH 7.5  5.0 - 8.0   Protein, ur NEGATIVE NEGATIVE mg/dL   Urobilinogen, UA 0.2 0.0 - 1.0 mg/dL   Nitrite NEGATIVE NEGATIVE   Leukocytes,Ua NEGATIVE NEGATIVE    Labs Reviewed  POC URINE PREG, ED  POCT URINALYSIS DIPSTICK, ED / UC  CERVICOVAGINAL ANCILLARY ONLY    Allergies  Allergen Reactions  . Flagyl [Metronidazole] Rash    "Breaks out in blisters"    Past Medical History:  Diagnosis Date  . Bacterial vaginosis   . Ovarian cyst   . Pregnancy induced hypertension    Family History  Problem Relation Age  of Onset  . Hypertension Mother   . Healthy Father    Social History   Socioeconomic History  . Marital status: Single    Spouse name: Not on file  . Number of children: Not on file  . Years of education: Not on file  . Highest education level: Not on file  Occupational History  . Not on file  Tobacco Use  . Smoking status: Former Smoker    Packs/day: 0.25    Types: Cigarettes  . Smokeless tobacco: Former Neurosurgeon    Quit date: 01/08/2015  Vaping Use  . Vaping Use: Never used  Substance and Sexual Activity  . Alcohol use: Not Currently    Alcohol/week: 0.0 standard drinks    Comment: occasionally  . Drug use: No  . Sexual activity: Yes    Partners: Male    Birth control/protection: None  Other Topics Concern  . Not on file  Social History Narrative  . Not on file   Social Determinants of Health   Financial Resource Strain:   . Difficulty of Paying Living Expenses: Not on file  Food Insecurity:   . Worried About Programme researcher, broadcasting/film/video in the Last Year: Not on file  . Ran Out of Food in the Last Year: Not on file  Transportation Needs:   . Lack of Transportation (Medical): Not on file  . Lack of Transportation (Non-Medical): Not on file  Physical Activity:   . Days of Exercise per Week: Not on file  . Minutes of Exercise per Session: Not on file  Stress:   . Feeling of Stress : Not on file  Social Connections:   . Frequency of Communication with Friends and Family: Not on file  . Frequency of Social Gatherings with Friends and Family: Not on file  . Attends Religious Services: Not on file  . Active Member of Clubs or Organizations: Not on file  . Attends Banker Meetings: Not on file  . Marital Status: Not on file  Intimate Partner Violence:   . Fear of Current or Ex-Partner: Not on file  . Emotionally Abused: Not on file  . Physically Abused: Not on file  . Sexually Abused: Not on file          [name], MD 10/16/20 1252

## 2020-10-16 NOTE — ED Triage Notes (Addendum)
Pt has mild abdominal pain and vaginal odor/discharge that started 2 days ago, pt has taken Tylenol to relieve discomfort. Pt last has unprotected sex 10 days ago, pt states he & her partner refcently broke up due to his cheating.

## 2020-10-16 NOTE — Discharge Instructions (Signed)
We have sent testing for sexually transmitted infections. We will notify you of any positive results once they are received. If required, we will prescribe any medications you might need.  Please refrain from all sexual activity for at least the next seven days.  Your blood pressure was noted to be elevated during your visit today. If you are currently taking medication for high blood pressure, please ensure you are taking this as directed. If you do not have a history of high blood pressure and your blood pressure remains persistently elevated, you may need to begin taking a medication at some point. You may return here within the next few days to recheck if unable to see your primary care provider or if do not have a one.  BP (!) 159/106 (BP Location: Right Arm)   Pulse 75   Temp 98.4 F (36.9 C) (Oral)   Resp 17   LMP 10/03/2020   SpO2 97%   Breastfeeding No

## 2020-10-17 LAB — CERVICOVAGINAL ANCILLARY ONLY
Bacterial Vaginitis (gardnerella): POSITIVE — AB
Candida Glabrata: NEGATIVE
Candida Vaginitis: POSITIVE — AB
Chlamydia: NEGATIVE
Comment: NEGATIVE
Comment: NEGATIVE
Comment: NEGATIVE
Comment: NEGATIVE
Comment: NEGATIVE
Comment: NORMAL
Neisseria Gonorrhea: NEGATIVE
Trichomonas: POSITIVE — AB

## 2020-10-18 ENCOUNTER — Telehealth (HOSPITAL_COMMUNITY): Payer: Self-pay | Admitting: Emergency Medicine

## 2020-10-18 MED ORDER — FLUCONAZOLE 150 MG PO TABS: 150.0000 mg | ORAL_TABLET | Freq: Once | ORAL | 0 refills | Status: AC

## 2020-10-18 NOTE — Telephone Encounter (Signed)
Patient is allergic to metronidazole. She will need desensitization for trichomonas vaginalis infection. Referral has been sent to Allergy and Immunology [facility].

## 2020-10-20 ENCOUNTER — Other Ambulatory Visit: Payer: Self-pay

## 2020-10-20 ENCOUNTER — Encounter: Payer: Self-pay | Admitting: Allergy

## 2020-10-20 ENCOUNTER — Ambulatory Visit (INDEPENDENT_AMBULATORY_CARE_PROVIDER_SITE_OTHER): Payer: Medicaid Other | Admitting: Allergy

## 2020-10-20 ENCOUNTER — Telehealth (HOSPITAL_COMMUNITY): Payer: Self-pay | Admitting: Family Medicine

## 2020-10-20 VITALS — BP 110/60 | HR 74 | Temp 98.3°F | Resp 16 | Ht 66.0 in | Wt 133.2 lb

## 2020-10-20 DIAGNOSIS — T50905D Adverse effect of unspecified drugs, medicaments and biological substances, subsequent encounter: Secondary | ICD-10-CM | POA: Diagnosis not present

## 2020-10-20 DIAGNOSIS — R21 Rash and other nonspecific skin eruption: Secondary | ICD-10-CM

## 2020-10-20 MED ORDER — TINIDAZOLE 500 MG PO TABS: 2.0000 g | ORAL_TABLET | Freq: Once | ORAL | 0 refills | Status: AC

## 2020-10-20 NOTE — Patient Instructions (Addendum)
Adverse effect of medication  - development blistering rash after administration of flagyl.  This is not an IgE mediated reaction and thus a desensitization is not recommended in this instance.  Flagyl should continued to be avoided as this non-IgE type reaction will continue to occur with use.  Flagyl and other similar mediations recommended to be avoided including clotrimazole, ketoconazole, miconazole.     - she has tolerated use however of tinidazole without any adverse symptoms which is a second generation imidazole thus would recommend use of this medication for treatment in the future  - if you have development of symptoms consistent with IgE mediated reaction (hives, swelling, difficulty breathing or wheezing lightheadedness or passing out) then stop the medication and let us know.  This would potentially be situation where a desensitiziation could be performed.  Follow-up as needed

## 2020-10-20 NOTE — Telephone Encounter (Signed)
Patient saw allergy consultant today.  That note is reviewed.  The allergy consultant recommends tinidazole be used for treatment of her trichomonas.  Prescription sent to her pharmacy.

## 2020-10-20 NOTE — Progress Notes (Signed)
New Patient Note  RE: Angela Manning MRN: [id number] DOB: 1986/07/22 Date of Office Visit: 10/20/2020  Referring provider: Merrilee Jansky, MD Primary care provider: Eliezer Bottom, MD  Chief Complaint: flagyl allergy  History of present illness: Angela Manning is a 35 y.o. female presenting today for consultation for adverse medication effect.    She states she was diagnosed with BV and trichomonas recently after a partner infidelity.  She does have history of recurrent BV infections.  She went to UC with vaginal symptoms and tested positive for both.  She was recommended to take flagyl however she reports an allergy.    She was prescribed clindamycin for treatment of BV however due to flagyl allergy was referred here.    She states when she has had flagyl in the past for recurrent BV she would develop blisters (fluid-filled) that would form after several hours of ingestion.  She states she would start itching first before onset of blisters.  The blister would pop and ooze.  The blistering rash would resolve in several days she reports.    She states the first time she had flagyl she noted blistering and on every subsequent use.  She denies having skin sloughing, mucosal involvement, fevers, joint aches/pains or other systemic symptoms.   She states in September 2021 she was diagnosed with another BV infection and was prescribed Tinidazole that she took without any issues at all.    Review of systems: Review of Systems  Constitutional: Negative.   HENT: Negative.   Eyes: Negative.   Respiratory: Negative.   Cardiovascular: Negative.   Gastrointestinal: Negative.   Musculoskeletal: Negative.   Skin: Negative.   Neurological: Negative.     All other systems negative unless noted above in HPI  Past medical history: Past Medical History:  Diagnosis Date  . Bacterial vaginosis   . Ovarian cyst   . Pregnancy induced hypertension     Past surgical history: Past Surgical  History:  Procedure Laterality Date  . DILATION AND EVACUATION N/A 02/21/2013   Procedure: DILATATION AND EVACUATION;  Surgeon: Reva Bores, MD;  Location: [facility];  Service: Gynecology;  Laterality: N/A;    Family history:  Family History  Problem Relation Age of Onset  . Hypertension Mother   . Healthy Father     Social history: Lives in a home with carpeting with electric heating and central cooling.  No pets in the home.  No concern for water damage, mildew or roaches in the home.  She is a Scientist, research (medical).  She denies a smoking history.   Medication List: Current Outpatient Medications  Medication Sig Dispense Refill  . FLUoxetine (PROZAC) 10 MG capsule TAKE 1 CAPSULE BY MOUTH EVERY DAY**DR NOT ENROLLED W/ MEDICAID 30 capsule 0  . hydrochlorothiazide (HYDRODIURIL) 25 MG tablet Take 1 tablet (25 mg total) by mouth daily. 90 tablet 0   No current facility-administered medications for this visit.    Known medication allergies: Allergies  Allergen Reactions  . Flagyl [Metronidazole] Rash    "Breaks out in blisters"     Physical examination: Blood pressure 110/60, pulse 74, temperature 98.3 F (36.8 C), temperature source Temporal, resp. rate 16, height 5\' 6"  (1.676 m), weight 133 lb 3.2 oz (60.4 kg), last menstrual period 10/03/2020, SpO2 99 %.  General: Alert, interactive, in no acute distress. HEENT: PERRLA, TMs pearly gray, turbinates non-edematous without discharge, post-pharynx non erythematous. Neck: Supple without lymphadenopathy. Lungs: Clear to auscultation without wheezing, rhonchi or rales. {  no increased work of breathing. CV: Normal S1, S2 without murmurs. Abdomen: Nondistended, nontender. Skin: Warm and dry, without lesions or rashes. Extremities:  No clubbing, cyanosis or edema. Neuro:   Grossly intact.  Diagnositics/Labs: None today  Assessment and plan:   Adverse effect of medication Blistering dermatitis secondary to medication  - development of  blistering rash after administration of metronidazole.  This has occurred every time she has taken the metronidazole in the past.  This is not an IgE mediated reaction and thus a desensitization is not recommended in this instance.  Metronidazole should continued to be avoided as this non-IgE type reaction will continue to occur with use.  Metronidazole and other similar mediations recommended to be avoided including medications like clotrimazole, ketoconazole, miconazole as they are in the same category as metronidazole and potentially could cause the same blistering rash as metronidazole.     -Interestingly she has tolerated use of tinidazole without any adverse symptoms which is a second generation imidazole.  This would normally be a medication recommended to be avoided as well since it is in the same category with metronidazole however since she has reported taking this medication without any issues in September 2021 then I think it is reasonable to continue to use this in the future for any treatment she might need.    -I did discuss with her signs and symptoms of anaphylaxis.  If you have development of symptoms consistent with IgE mediated reaction (hives, swelling, difficulty breathing or wheezing lightheadedness or passing out) then stop the medication and let us know.  This would potentially be a situation where a desensitiziation could be performed.   -Also counseled on non-IgE mediated adverse events related to medication use that can include things like serum sickness, serum sickness like reaction, SJS/TEN.  All of these would preclude not taking the medication again.  Follow-up as needed  I appreciate the opportunity to take part in Angela Manning's care. Please do not hesitate to contact me with questions.  Sincerely,   Margo Aye, MD Allergy/Immunology [facility]

## 2020-10-23 NOTE — Telephone Encounter (Signed)
Call placed to patient. Explained that a refill with 90 tabs had been sent to [facility] on 10/09/2020. She states she received 30 tabs from [facility] today. Explained they may have refilled an older Rx and not the most recent. She will contact [facility] now. Kinnie Feil, BSN, RN-BC

## 2020-11-15 ENCOUNTER — Encounter (HOSPITAL_COMMUNITY): Payer: Self-pay

## 2020-11-15 ENCOUNTER — Other Ambulatory Visit: Payer: Self-pay

## 2020-11-15 ENCOUNTER — Ambulatory Visit (HOSPITAL_COMMUNITY)
Admission: RE | Admit: 2020-11-15 | Discharge: 2020-11-15 | Disposition: A | Discharge status: Home / Self Care | Payer: Medicaid Other | Source: Ambulatory Visit | Attending: Emergency Medicine | Admitting: Emergency Medicine

## 2020-11-15 VITALS — BP 131/109 | HR 70 | Temp 98.5°F | Resp 19

## 2020-11-15 DIAGNOSIS — N898 Other specified noninflammatory disorders of vagina: Secondary | ICD-10-CM | POA: Diagnosis not present

## 2020-11-15 DIAGNOSIS — Z79899 Other long term (current) drug therapy: Secondary | ICD-10-CM | POA: Diagnosis not present

## 2020-11-15 NOTE — Discharge Instructions (Addendum)
Your vaginal tests are pending.  If your test results are positive, we will call you.  You and your sexual partner(s) may require treatment at that time.  Do not have sexual activity until the test results are back.    Follow up with your primary care provider or gynecologist if your symptoms are not improving.

## 2020-11-15 NOTE — ED Provider Notes (Signed)
[facility]    CSN: [id number] Arrival date & time: 11/15/20  1604      History   Chief Complaint Chief Complaint  Patient presents with  . Appointment    4  . Vaginitis    HPI Angela Manning is a 34 y.o. female.   Patient presents with vaginal odor since this morning.  She denies vaginal discharge, pelvic pain, abdominal pain, dysuria, back pain, rash, lesions, or other symptoms.  Her medical history includes bacterial vaginitis, candidal vaginitis, and trichomonas on 2021.  The history is provided by the patient and medical records.    Past Medical History:  Diagnosis Date  . Bacterial vaginosis   . Ovarian cyst   . Pregnancy induced hypertension     Patient Active Problem List   Diagnosis Date Noted  . Essential hypertension 03/03/2020  . Anxiety 03/03/2020  . Bacterial vaginosis 03/03/2020  . Supervision of high risk pregnancy, antepartum 10/27/2019  . Hypertension affecting pregnancy, antepartum 10/27/2019  . Nausea 05/09/2017  . Elevated blood pressure reading 05/09/2017  . Elevated serum hCG 03/12/2016  . Ovarian cyst, left 03/01/2015  . Abnormal uterine bleeding (AUB) 03/01/2015  . Dyspareunia 03/01/2015  . Smoker 10/23/2014  . Retained products of conception with hemorrhage 02/21/2013    Past Surgical History:  Procedure Laterality Date  . DILATION AND EVACUATION N/A 02/21/2013   Procedure: DILATATION AND EVACUATION;  Surgeon: Reva Bores, MD;  Location: [facility];  Service: Gynecology;  Laterality: N/A;    OB History    Gravida  5   Para  4   Term  3   Preterm  1   AB      Living  4     SAB      TAB      Ectopic      Multiple      Live Births  4            Home Medications    Prior to Admission medications   Medication Sig Start Date End Date Taking? Authorizing Provider  FLUoxetine (PROZAC) 10 MG capsule TAKE 1 CAPSULE BY MOUTH EVERY DAY**DR NOT ENROLLED W/ MEDICAID 09/26/20   Eliezer Bottom, MD   hydrochlorothiazide (HYDRODIURIL) 25 MG tablet Take 1 tablet (25 mg total) by mouth daily. 10/09/20   Katsadouros, Vasilios, MD  norethindrone (MICRONOR,CAMILA,ERRIN) 0.35 MG tablet Take 1 tablet (0.35 mg total) by mouth daily. Patient not taking: Reported on 08/23/2019 10/07/18 09/29/19  Conan Bowens, MD    Family History Family History  Problem Relation Age of Onset  . Hypertension Mother   . Healthy Father     Social History Social History   Tobacco Use  . Smoking status: Former Smoker    Packs/day: 0.25    Types: Cigarettes  . Smokeless tobacco: Former Neurosurgeon    Quit date: 01/08/2015  . Tobacco comment: quit 3 years ago   Vaping Use  . Vaping Use: Never used  Substance Use Topics  . Alcohol use: Not Currently    Alcohol/week: 0.0 standard drinks    Comment: occasionally  . Drug use: No     Allergies   Flagyl [metronidazole]   Review of Systems Review of Systems  Constitutional: Negative for chills and fever.  HENT: Negative for ear pain and sore throat.   Eyes: Negative for pain and visual disturbance.  Respiratory: Negative for cough and shortness of breath.   Cardiovascular: Negative for chest pain and palpitations.  Gastrointestinal: Negative  for abdominal pain and vomiting.  Genitourinary: Negative for dysuria, flank pain, hematuria, pelvic pain and vaginal discharge.  Musculoskeletal: Negative for arthralgias and back pain.  Skin: Negative for color change and rash.  Neurological: Negative for seizures and syncope.  All other systems reviewed and are negative.    Physical Exam Triage Vital Signs ED Triage Vitals  Enc Vitals Group     BP 11/15/20 1620 (!) 131/109     Pulse Rate 11/15/20 1620 70     Resp 11/15/20 1620 19     Temp 11/15/20 1620 98.5 F (36.9 C)     Temp src --      SpO2 11/15/20 1620 98 %     Weight --      Height --      Head Circumference --      Peak Flow --      Pain Score 11/15/20 1619 0     Pain Loc --      Pain Edu? --       Excl. in GC? --    No data found.  Updated Vital Signs BP (!) 131/109   Pulse 70   Temp 98.5 F (36.9 C)   Resp 19   LMP 11/01/2020   SpO2 98%   Visual Acuity Right Eye Distance:   Left Eye Distance:   Bilateral Distance:    Right Eye Near:   Left Eye Near:    Bilateral Near:     Physical Exam Vitals and nursing note reviewed.  Constitutional:      General: She is not in acute distress.    Appearance: She is well-developed. She is not ill-appearing.  HENT:     Head: Normocephalic and atraumatic.     Mouth/Throat:     Mouth: Mucous membranes are moist.  Eyes:     Conjunctiva/sclera: Conjunctivae normal.  Cardiovascular:     Rate and Rhythm: Normal rate and regular rhythm.     Heart sounds: Normal heart sounds.  Pulmonary:     Effort: Pulmonary effort is normal. No respiratory distress.     Breath sounds: Normal breath sounds.  Abdominal:     Palpations: Abdomen is soft.     Tenderness: There is no abdominal tenderness. There is no right CVA tenderness, left CVA tenderness, guarding or rebound.  Musculoskeletal:     Cervical back: Neck supple.  Skin:    General: Skin is warm and dry.     Findings: No rash.  Neurological:     General: No focal deficit present.     Mental Status: She is alert and oriented to person, place, and time.     Gait: Gait normal.  Psychiatric:        Mood and Affect: Mood normal.        Behavior: Behavior normal.      UC Treatments / Results  Labs (all labs ordered are listed, but only abnormal results are displayed) Labs Reviewed  CERVICOVAGINAL ANCILLARY ONLY    EKG   Radiology No results found.  Procedures Procedures (including critical care time)  Medications Ordered in UC Medications - No data to display  Initial Impression / Assessment and Plan / UC Course  I have reviewed the triage vital signs and the nursing notes.  Pertinent labs & imaging results that were available during my care of the patient were  reviewed by me and considered in my medical decision making (see chart for details).   Vaginal odor.  Patient obtained vaginal  self swab for testing.  Instructed her to abstain from sexual activity until the test results are back.  Discussed that we will call her if the test are positive indicating the need for treatment.  Instructed her to follow-up with her PCP or gynecologist if her symptoms are not improving.  Patient agrees to plan of care.   Final Clinical Impressions(s) / UC Diagnoses   Final diagnoses:  Vaginal odor     Discharge Instructions     Your vaginal tests are pending.  If your test results are positive, we will call you.  You and your sexual partner(s) may require treatment at that time.  Do not have sexual activity until the test results are back.    Follow up with your primary care provider or gynecologist if your symptoms are not improving.          ED Prescriptions    None     PDMP not reviewed this encounter.   Mickie Bail, NP 11/15/20 1722

## 2020-11-15 NOTE — ED Triage Notes (Signed)
Pt presents with concern for bacterial vaginosis. Reports she has a history of it. States that she knows the smell and she is having that odor now. Denies any discharge or pain.

## 2020-11-16 ENCOUNTER — Telehealth (HOSPITAL_COMMUNITY): Payer: Self-pay | Admitting: Emergency Medicine

## 2020-11-16 LAB — CERVICOVAGINAL ANCILLARY ONLY
Bacterial Vaginitis (gardnerella): POSITIVE — AB
Candida Glabrata: NEGATIVE
Candida Vaginitis: POSITIVE — AB
Chlamydia: NEGATIVE
Comment: NEGATIVE
Comment: NEGATIVE
Comment: NEGATIVE
Comment: NEGATIVE
Comment: NEGATIVE
Comment: NORMAL
Neisseria Gonorrhea: NEGATIVE
Trichomonas: NEGATIVE

## 2020-11-16 MED ORDER — CLINDAMYCIN HCL 150 MG PO CAPS: 300.0000 mg | ORAL_CAPSULE | Freq: Two times a day (BID) | ORAL | 0 refills | Status: AC

## 2020-11-16 MED ORDER — FLUCONAZOLE 150 MG PO TABS: 150.0000 mg | ORAL_TABLET | Freq: Once | ORAL | 0 refills | Status: AC

## 2020-11-17 DIAGNOSIS — Z79899 Other long term (current) drug therapy: Secondary | ICD-10-CM | POA: Diagnosis not present

## 2020-11-22 DIAGNOSIS — Z79899 Other long term (current) drug therapy: Secondary | ICD-10-CM | POA: Diagnosis not present

## 2020-11-24 DIAGNOSIS — Z79899 Other long term (current) drug therapy: Secondary | ICD-10-CM | POA: Diagnosis not present

## 2020-11-29 DIAGNOSIS — Z79899 Other long term (current) drug therapy: Secondary | ICD-10-CM | POA: Diagnosis not present

## 2020-12-01 DIAGNOSIS — Z79899 Other long term (current) drug therapy: Secondary | ICD-10-CM | POA: Diagnosis not present

## 2020-12-06 DIAGNOSIS — Z79899 Other long term (current) drug therapy: Secondary | ICD-10-CM | POA: Diagnosis not present

## 2020-12-08 DIAGNOSIS — Z79899 Other long term (current) drug therapy: Secondary | ICD-10-CM | POA: Diagnosis not present

## 2020-12-11 DIAGNOSIS — Z79899 Other long term (current) drug therapy: Secondary | ICD-10-CM | POA: Diagnosis not present

## 2020-12-13 DIAGNOSIS — Z79899 Other long term (current) drug therapy: Secondary | ICD-10-CM | POA: Diagnosis not present

## 2020-12-18 DIAGNOSIS — Z79899 Other long term (current) drug therapy: Secondary | ICD-10-CM | POA: Diagnosis not present

## 2020-12-20 DIAGNOSIS — Z79899 Other long term (current) drug therapy: Secondary | ICD-10-CM | POA: Diagnosis not present

## 2020-12-25 DIAGNOSIS — Z79899 Other long term (current) drug therapy: Secondary | ICD-10-CM | POA: Diagnosis not present

## 2020-12-27 DIAGNOSIS — Z79899 Other long term (current) drug therapy: Secondary | ICD-10-CM | POA: Diagnosis not present

## 2021-01-01 DIAGNOSIS — Z79899 Other long term (current) drug therapy: Secondary | ICD-10-CM | POA: Diagnosis not present

## 2021-01-03 DIAGNOSIS — Z79899 Other long term (current) drug therapy: Secondary | ICD-10-CM | POA: Diagnosis not present

## 2021-01-08 DIAGNOSIS — Z79899 Other long term (current) drug therapy: Secondary | ICD-10-CM | POA: Diagnosis not present

## 2021-01-10 DIAGNOSIS — Z79899 Other long term (current) drug therapy: Secondary | ICD-10-CM | POA: Diagnosis not present

## 2021-01-15 DIAGNOSIS — Z79899 Other long term (current) drug therapy: Secondary | ICD-10-CM | POA: Diagnosis not present

## 2021-01-17 DIAGNOSIS — Z79899 Other long term (current) drug therapy: Secondary | ICD-10-CM | POA: Diagnosis not present

## 2021-01-22 DIAGNOSIS — Z79899 Other long term (current) drug therapy: Secondary | ICD-10-CM | POA: Diagnosis not present

## 2021-01-23 IMAGING — US US OB TRANSVAGINAL
1 series · 16 of 28 positions shown · non-contrast
Comparison: 10/03/2019

CLINICAL DATA: Pregnant, for viability

EXAM:
TRANSVAGINAL OB ULTRASOUND
TECHNIQUE: Transvaginal ultrasound was performed for complete evaluation of the
gestation as well as the maternal uterus, adnexal regions, and
pelvic cul-de-sac.

[Series 1: us ob transvaginal · 34 acquisitions, 16 frames shown]
[im 1/34]
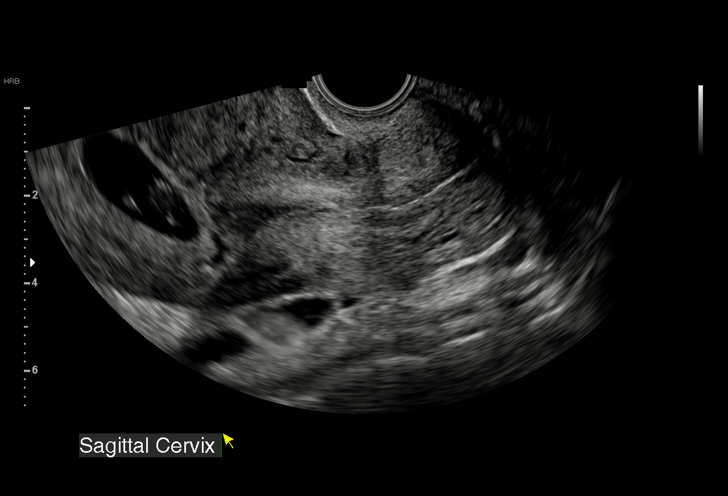
[im 3/34]
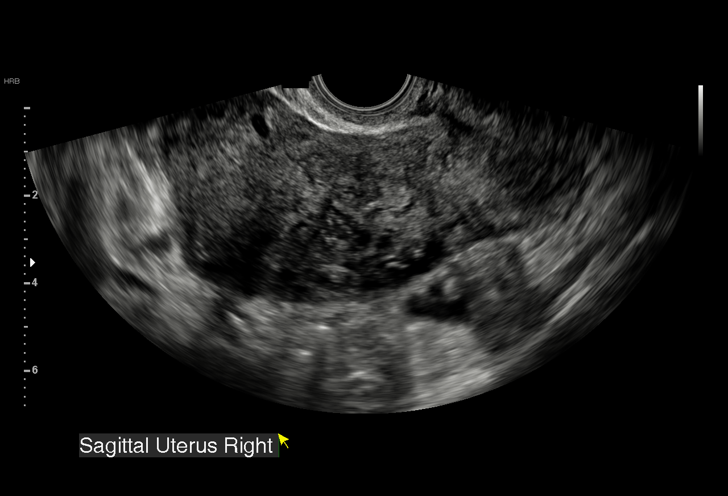
[im 5/34]
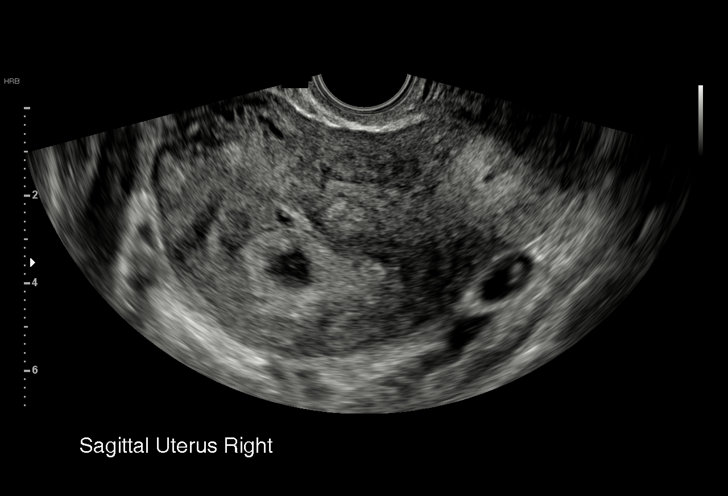
[im 8/34]
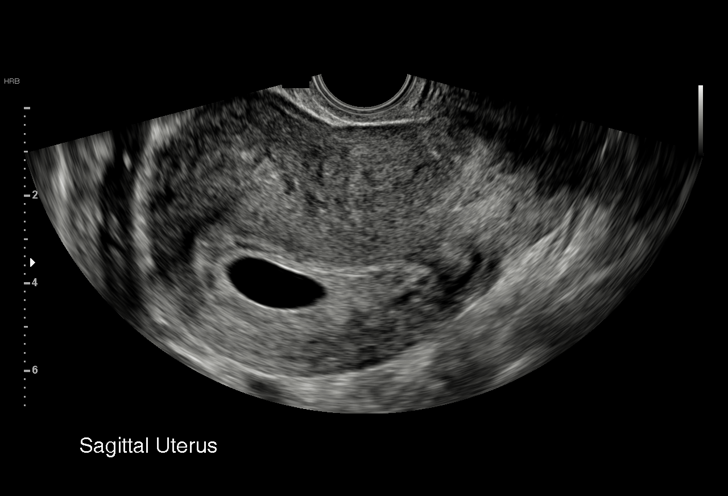
[im 9/34]
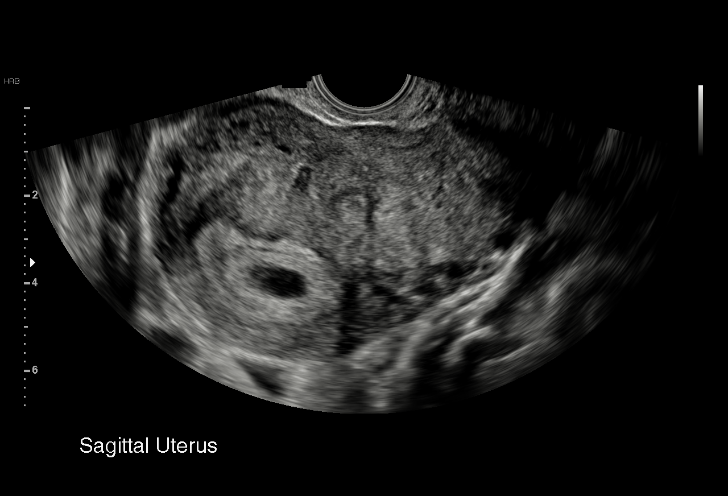
[im 12/34]
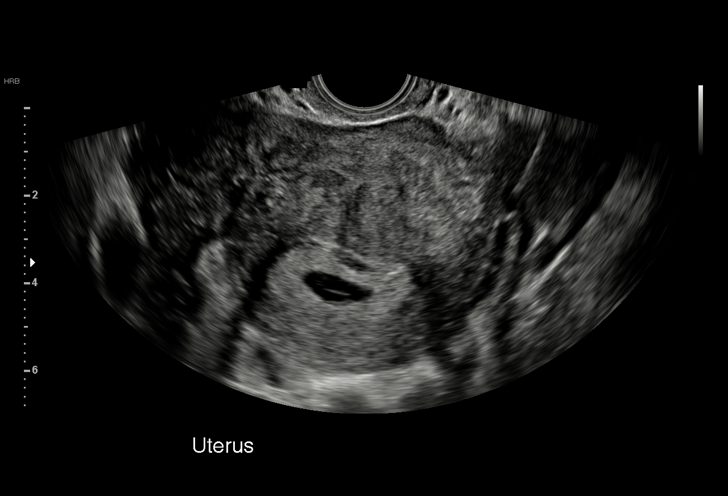
[im 14/34]
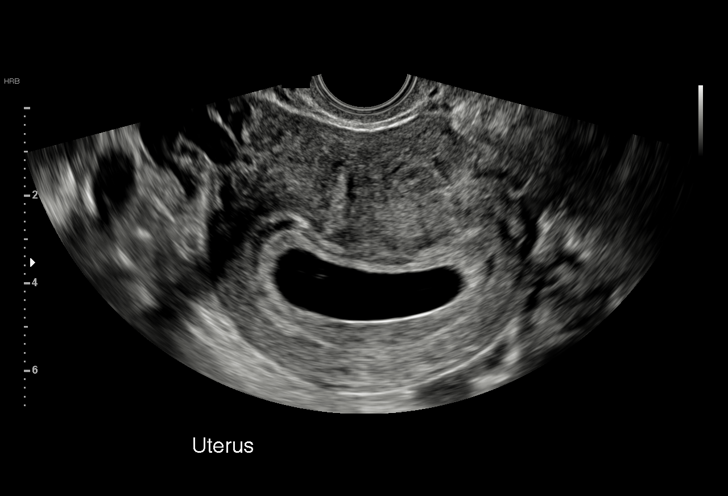
[im 16/34]
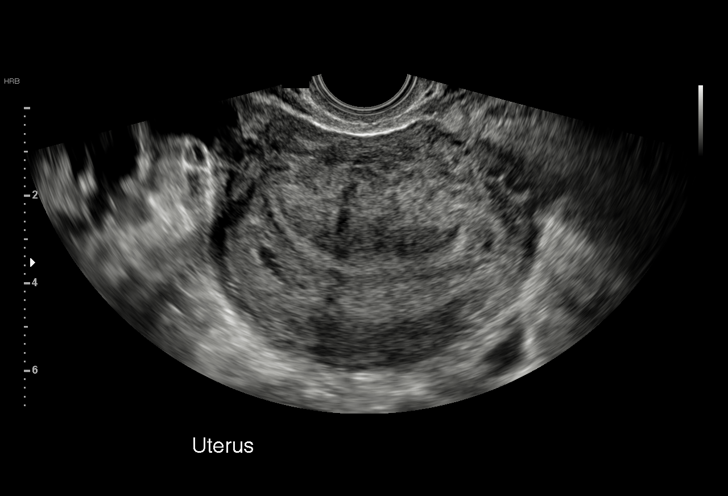
[im 18/34]
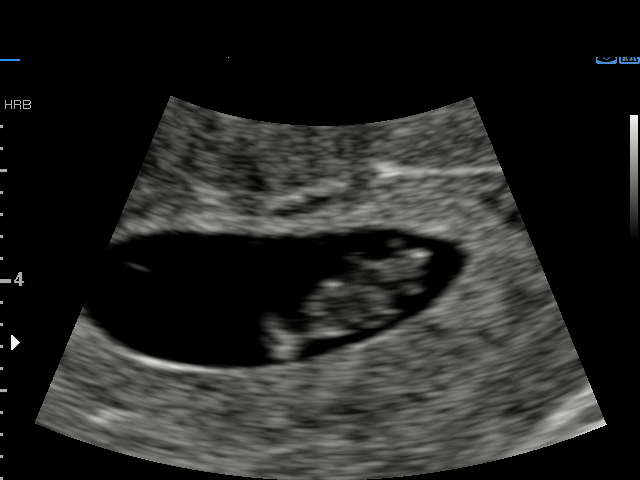
[im 20/34]
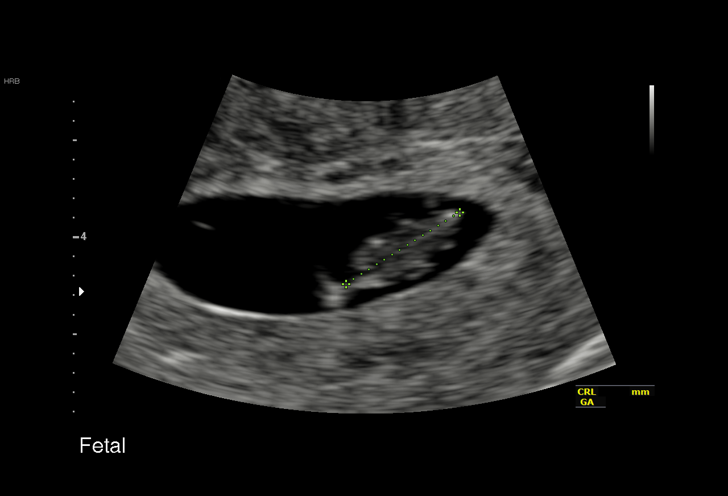
[im 23/34]
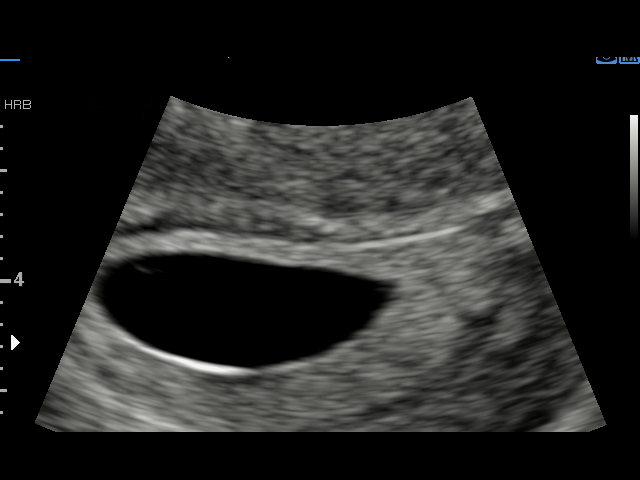
[im 25/34]
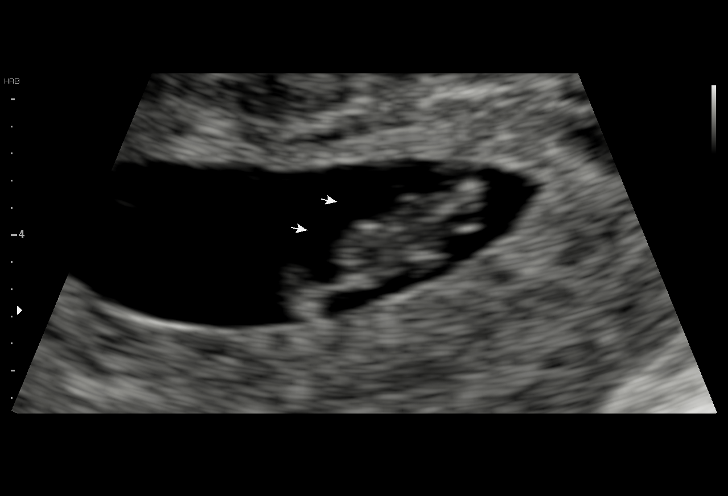
[im 26/34]
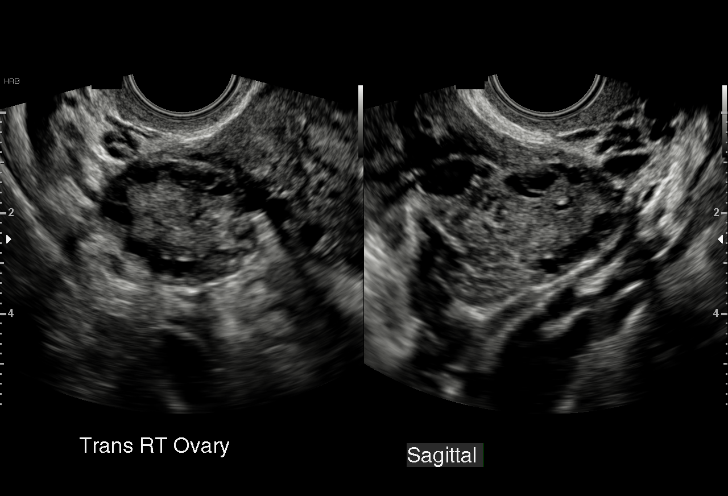
[im 29/34]
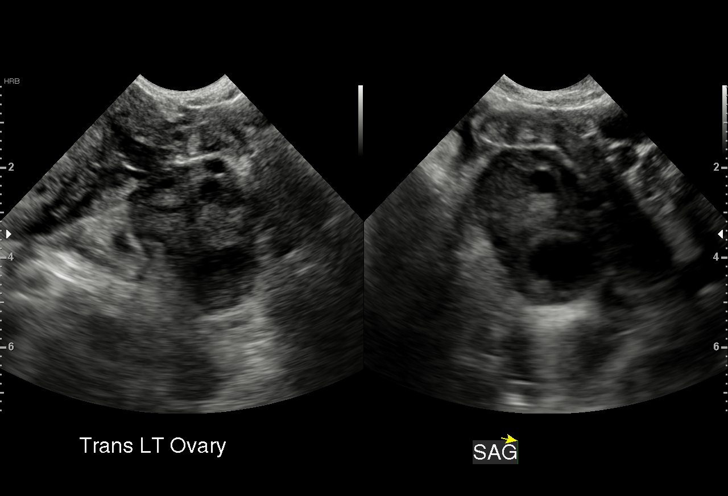
[im 31/34]
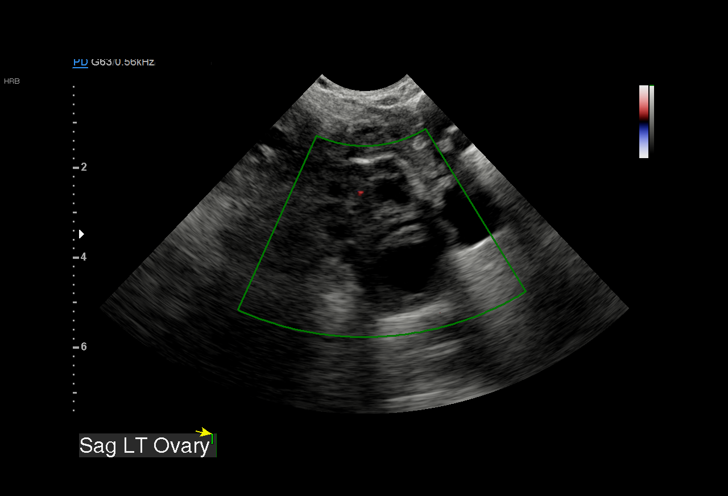
[im 34/34]
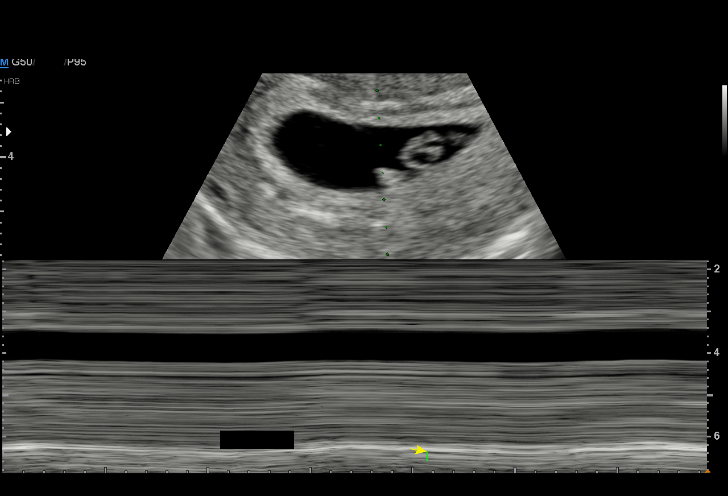

[16 of 28 positions shown; findings below may reference images not displayed]

FINDINGS: Intrauterine gestational sac: Single

Yolk sac:  Visualized.

Embryo:  Visualized.

Cardiac Activity: Visualized.

Heart Rate: 154 bpm

CRL:   13.6 mm   7 w 4 d                  US EDC: 06/02/2020

Subchorionic hemorrhage:  None visualized.

Maternal uterus/adnexae: Bilateral ovaries are within normal limits.

No free fluid.
IMPRESSION: Single live intrauterine gestation, with estimated gestational age 7
weeks 4 days by crown-rump length, as above.

## 2021-01-24 DIAGNOSIS — Z79899 Other long term (current) drug therapy: Secondary | ICD-10-CM | POA: Diagnosis not present

## 2021-01-29 DIAGNOSIS — Z79899 Other long term (current) drug therapy: Secondary | ICD-10-CM | POA: Diagnosis not present

## 2021-01-31 DIAGNOSIS — Z79899 Other long term (current) drug therapy: Secondary | ICD-10-CM | POA: Diagnosis not present

## 2021-03-12 ENCOUNTER — Ambulatory Visit: Payer: Medicaid Other | Admitting: Internal Medicine

## 2021-03-12 NOTE — Progress Notes (Deleted)
   CC: HTN, vision changes   HPI:  Angela Manning is a 35 y.o. with a past medical history listed below presenting for evaluation of her blood pressure and changes in her vision. For details of today's visit and the status of his chronic medical issues please refer to the assessment and plan.   Past Medical History:  Diagnosis Date  . Bacterial vaginosis   . Ovarian cyst   . Pregnancy induced hypertension    Review of Systems:  ***  Physical Exam:  There were no vitals filed for this visit. ***  Assessment & Plan:   See Encounters Tab for problem based charting.  Patient {GC/GE:3044014::"discussed with","seen with"} Dr. {NAMES:[id number]::"Butcher","Guilloud","Hoffman","Mullen","Narendra","Raines","Vincent"}

## 2021-04-13 ENCOUNTER — Other Ambulatory Visit: Payer: Self-pay | Admitting: *Deleted

## 2021-04-13 DIAGNOSIS — I10 Essential (primary) hypertension: Secondary | ICD-10-CM

## 2021-04-13 MED ORDER — HYDROCHLOROTHIAZIDE 25 MG PO TABS: 25.0000 mg | ORAL_TABLET | Freq: Every day | ORAL | 0 refills | Status: DC

## 2021-04-13 NOTE — Telephone Encounter (Signed)
Appointment Request From: Sheran Fava  With Provider: Milus Banister, MD [facility]]  Preferred Date Range: Any  Preferred Times: Any Time  Reason for visit: Office Visit  Comments: Hello I need my bloood pressure medicine I was wondering could it be sent over to my pharmacy as soon as possible please.   Pt was last seen 02/2020 and she has canceled / n0-show several  Appts. I called pt to schedule an appt and emphasized she needs to keep this appt.- stated she understand. Call transferred to front office -  appt with Dr Huel Cote on 5/12.

## 2021-04-19 ENCOUNTER — Encounter: Payer: Self-pay | Admitting: Internal Medicine

## 2021-04-19 ENCOUNTER — Other Ambulatory Visit: Payer: Self-pay

## 2021-04-19 ENCOUNTER — Ambulatory Visit: Payer: Medicaid Other | Admitting: Internal Medicine

## 2021-04-19 VITALS — BP 138/97 | HR 73 | Temp 98.2°F | Ht 66.0 in | Wt 133.4 lb

## 2021-04-19 DIAGNOSIS — F411 Generalized anxiety disorder: Secondary | ICD-10-CM | POA: Insufficient documentation

## 2021-04-19 DIAGNOSIS — F419 Anxiety disorder, unspecified: Secondary | ICD-10-CM

## 2021-04-19 DIAGNOSIS — R631 Polydipsia: Secondary | ICD-10-CM | POA: Insufficient documentation

## 2021-04-19 DIAGNOSIS — I10 Essential (primary) hypertension: Secondary | ICD-10-CM | POA: Diagnosis not present

## 2021-04-19 LAB — POCT GLYCOSYLATED HEMOGLOBIN (HGB A1C): Hemoglobin A1C: 4.8 % (ref 4.0–5.6)

## 2021-04-19 LAB — GLUCOSE, CAPILLARY: Glucose-Capillary: 85 mg/dL (ref 70–99)

## 2021-04-19 MED ORDER — HYDROCHLOROTHIAZIDE 25 MG PO TABS: 25.0000 mg | ORAL_TABLET | Freq: Every day | ORAL | 1 refills | Status: DC

## 2021-04-19 MED ORDER — FLUOXETINE HCL 10 MG PO CAPS: 10.0000 mg | ORAL_CAPSULE | Freq: Every day | ORAL | 0 refills | Status: DC

## 2021-04-19 NOTE — Assessment & Plan Note (Signed)
Angela Manning endorses a long-term history of polydipsia but denies any polyuria.  She notes that she does have some type 2 diabetes in her family and would like evaluation for it.  Assessment/plan: A1c was evaluated and no evidence of diabetes at this time.  BMP was also obtained and will evaluate for hyponatremia.  If negative will continue work-up at future visits

## 2021-04-19 NOTE — Patient Instructions (Addendum)
It was nice seeing you today! Thank you for choosing [facility] for your Primary Care.    Today we talked about:   1. High blood pressure: I have refilled your HCTZ. Additionally, we will check for other causes of your high blood pressure with an ultrasound and blood work. I will call you with the results.  2. Anxiety: I have refilled your Fluoxetine.

## 2021-04-19 NOTE — Assessment & Plan Note (Signed)
Angela Manning endorses a long-term history of generalized anxiety disorder that she was previously treated with Prozac.  She states initially she did not feel the Prozac was helping her significantly but recently her family has noted her anxiety levels were much better when she was on it.  Angela Manning states that she has good days and bad days but the bad days are frequent enough that it makes her life difficult overall.   Assessment/plan: - Restart Prozac 10 mg daily

## 2021-04-19 NOTE — Progress Notes (Signed)
   CC: HTN  HPI:  [name] is a 35 y.o. with a PMHx as listed below who presents to the clinic for HTN.   Please see the Encounters tab for problem-based Assessment & Plan regarding status of patient's acute and chronic conditions.  Past Medical History:  Diagnosis Date  . Bacterial vaginosis   . Ovarian cyst   . Pregnancy induced hypertension    Review of Systems: Review of Systems  Constitutional: Negative for chills, fever and weight loss.  Respiratory: Negative for cough and shortness of breath.   Cardiovascular: Negative for chest pain, palpitations and leg swelling.  Gastrointestinal: Negative for abdominal pain, nausea and vomiting.  Neurological: Negative for dizziness and headaches.  Psychiatric/Behavioral: Negative for depression. The patient is nervous/anxious.     Physical Exam:  Vitals:   04/19/21 1038 04/19/21 1041  BP: (!) 146/95 (!) 138/97  Pulse: 75 73  Temp: 98.2 F (36.8 C)   TempSrc: Oral   SpO2: 100%   Weight: 133 lb 6.4 oz (60.5 kg)   Height: 5\' 6"  (1.676 m)    Physical Exam Vitals and nursing note reviewed.  Constitutional:      General: She is not in acute distress.    Appearance: She is normal weight.  HENT:     Head: Normocephalic and atraumatic.  Eyes:     Conjunctiva/sclera: Conjunctivae normal.     Pupils: Pupils are equal, round, and reactive to light.  Cardiovascular:     Rate and Rhythm: Normal rate and regular rhythm.     Heart sounds: No murmur heard.   Pulmonary:     Effort: Pulmonary effort is normal.     Breath sounds: Normal breath sounds.  Musculoskeletal:     Right lower leg: No edema.     Left lower leg: No edema.  Skin:    General: Skin is warm and dry.  Neurological:     General: No focal deficit present.     Mental Status: She is alert and oriented to person, place, and time. Mental status is at baseline.  Psychiatric:        Mood and Affect: Mood normal.        Behavior: Behavior normal.         Thought Content: Thought content normal.        Judgment: Judgment normal.    Assessment & Plan:   See Encounters Tab for problem based charting.  Patient discussed with Dr. 

## 2021-04-19 NOTE — Assessment & Plan Note (Signed)
Angela Manning states she has a history of hypertension dating back to when she was 35 years old and started requiring medication when she became pregnant a year or so after that.  She has since been on medication for her blood pressure.  She notes she has been out of HCTZ for approximately 1 month now.   She has never had a secondary hypertension work-up before.  She denies any chest pain, dizziness, headache, shortness of breath, leg swelling at this time.  Assessment/plan: Given patient is not overweight, non-smoker, with hypertension dating back 18 years, it is possible she has a secondary cause for hypertension, differential including fibromuscular dysplasia versus hyper aldosteronism.  We will initiate work-up today as well as restart her blood pressure medications.    - Refill HCTZ 25 mg daily - Renal artery stenosis ultrasound ordered - Plasma renin and aldosterone with ratio ordered - BMP ordered

## 2021-04-20 LAB — BMP8+ANION GAP
Anion Gap: 12 mmol/L (ref 10.0–18.0)
BUN/Creatinine Ratio: 14 (ref 9–23)
BUN: 10 mg/dL (ref 6–20)
CO2: 24 mmol/L (ref 20–29)
Calcium: 9.4 mg/dL (ref 8.7–10.2)
Chloride: 102 mmol/L (ref 96–106)
Creatinine, Ser: 0.69 mg/dL (ref 0.57–1.00)
Glucose: 84 mg/dL (ref 65–99)
Potassium: 4.5 mmol/L (ref 3.5–5.2)
Sodium: 138 mmol/L (ref 134–144)
eGFR: 117 mL/min/{1.73_m2} (ref >59)

## 2021-04-20 NOTE — Progress Notes (Signed)
[facility] Medicine Clinic Attending  Case discussed with Dr. Basaraba  At the time of the visit.  We reviewed the resident's history and exam and pertinent patient test results.  I agree with the assessment, diagnosis, and plan of care documented in the resident's note.  

## 2021-05-08 LAB — ALDOSTERONE + RENIN ACTIVITY W/ RATIO
ALDOS/RENIN RATIO: 4.2 (ref 0.0–30.0)
ALDOSTERONE: 5 ng/dL (ref 0.0–30.0)
Renin: 1.187 ng/mL/hr (ref 0.167–5.380)

## 2021-05-08 NOTE — Progress Notes (Signed)
No evidence of primary hyperaldosteronism. Will follow up renal artery ultrasound and discuss next steps at upcoming visit.

## 2021-05-11 ENCOUNTER — Ambulatory Visit (HOSPITAL_COMMUNITY): Payer: Self-pay

## 2021-06-04 ENCOUNTER — Telehealth: Payer: Self-pay

## 2021-06-04 ENCOUNTER — Encounter: Payer: Medicaid Other | Admitting: Student

## 2021-06-06 ENCOUNTER — Other Ambulatory Visit: Payer: Self-pay

## 2021-06-06 ENCOUNTER — Ambulatory Visit (HOSPITAL_COMMUNITY)
Admission: RE | Admit: 2021-06-06 | Discharge: 2021-06-06 | Disposition: A | Discharge status: Home / Self Care | Payer: Medicaid Other | Source: Ambulatory Visit | Attending: Urgent Care | Admitting: Urgent Care

## 2021-06-06 ENCOUNTER — Encounter (HOSPITAL_COMMUNITY): Payer: Self-pay

## 2021-06-06 VITALS — BP 208/112 | HR 69 | Temp 98.2°F | Resp 18

## 2021-06-06 DIAGNOSIS — B9689 Other specified bacterial agents as the cause of diseases classified elsewhere: Secondary | ICD-10-CM | POA: Diagnosis not present

## 2021-06-06 DIAGNOSIS — I160 Hypertensive urgency: Secondary | ICD-10-CM

## 2021-06-06 DIAGNOSIS — Z760 Encounter for issue of repeat prescription: Secondary | ICD-10-CM | POA: Diagnosis not present

## 2021-06-06 DIAGNOSIS — N760 Acute vaginitis: Secondary | ICD-10-CM | POA: Diagnosis not present

## 2021-06-06 DIAGNOSIS — I10 Essential (primary) hypertension: Secondary | ICD-10-CM | POA: Diagnosis not present

## 2021-06-06 LAB — POCT URINALYSIS DIPSTICK, ED / UC
Bilirubin Urine: NEGATIVE
Glucose, UA: NEGATIVE mg/dL
Hgb urine dipstick: NEGATIVE
Ketones, ur: NEGATIVE mg/dL
Leukocytes,Ua: NEGATIVE
Nitrite: NEGATIVE
Protein, ur: NEGATIVE mg/dL
Specific Gravity, Urine: 1.02 (ref 1.005–1.030)
Urobilinogen, UA: 2 mg/dL — ABNORMAL HIGH (ref 0.0–1.0)
pH: 7 (ref 5.0–8.0)

## 2021-06-06 LAB — POC URINE PREG, ED: Preg Test, Ur: NEGATIVE

## 2021-06-06 MED ORDER — CLINDAMYCIN HCL 300 MG PO CAPS: 300.0000 mg | ORAL_CAPSULE | Freq: Two times a day (BID) | ORAL | 0 refills | Status: DC

## 2021-06-06 MED ORDER — HYDROCHLOROTHIAZIDE 25 MG PO TABS: 25.0000 mg | ORAL_TABLET | Freq: Every day | ORAL | 0 refills | Status: DC

## 2021-06-06 MED ORDER — FLUCONAZOLE 150 MG PO TABS: 150.0000 mg | ORAL_TABLET | ORAL | 0 refills | Status: DC

## 2021-06-06 NOTE — ED Triage Notes (Addendum)
Pt c/o lower abdominal discomfort (reports minimal amount of vaginal discharge with an odor) since yesterday. States has had BV in the past and it feels like BV.   Pt reports being out of her BP meds for 2 days and would like a refill.

## 2021-06-06 NOTE — ED Provider Notes (Signed)
[facility]   MRN: [id number] DOB: Oct 10, 1986  Subjective:   Angela Manning is a 35 y.o. female presenting for 1 day history of cute onset recurrent malodorous vaginal discharge that is thick and heavy.  She has had a lot of trouble with recurrent bacterial vaginosis infections.  Has also had BV infections.  Denies fever, nausea, vomiting, abdominal or pelvic pain, dysuria, urinary frequency, genital rash, hematuria.  Regarding her blood pressure, patient denies headache, confusion, weakness, dizziness, chest pain, shortness of breath, heart racing, numbness or tingling.  Denies history of MI, stroke, kidney disease.  She does have a PCP, was prescribed her blood pressure medication hydrochlorothiazide but she has not been taking it for the past month.  No current facility-administered medications for this encounter.  Current Outpatient Medications:    hydrochlorothiazide (HYDRODIURIL) 25 MG tablet, Take 1 tablet (25 mg total) by mouth daily., Disp: 90 tablet, Rfl: 1   FLUoxetine (PROZAC) 10 MG capsule, Take 1 capsule (10 mg total) by mouth daily., Disp: 90 capsule, Rfl: 0   Allergies  Allergen Reactions   Flagyl [Metronidazole] Rash    "Breaks out in blisters"    Past Medical History:  Diagnosis Date   Bacterial vaginosis    Ovarian cyst    Pregnancy induced hypertension      Past Surgical History:  Procedure Laterality Date   DILATION AND EVACUATION N/A 02/21/2013   Procedure: DILATATION AND EVACUATION;  Surgeon: Reva Bores, MD;  Location: [facility];  Service: Gynecology;  Laterality: N/A;    Family History  Problem Relation Age of Onset   Hypertension Mother    Healthy Father     Social History   Tobacco Use   Smoking status: Former    Packs/day: 0.25    Pack years: 0.00    Types: Cigarettes   Smokeless tobacco: Former    Quit date: 01/08/2015   Tobacco comments:    quit 3 years ago   Vaping Use   Vaping Use: Never used  Substance Use Topics    Alcohol use: Not Currently    Alcohol/week: 0.0 standard drinks    Comment: occasionally   Drug use: No    ROS   Objective:   Vitals: BP (S) (!) 208/112 (BP Location: Left Arm) Comment: denies headache, dizziness, chest pain. States has not had BP meds in 2 days. Kaamil Morefield PA notified.  Pulse 69   Temp 98.2 F (36.8 C)   Resp 18   LMP 05/13/2021   SpO2 100%   BP recheck was 184/111  Physical Exam Constitutional:      General: She is not in acute distress.    Appearance: Normal appearance. She is well-developed. She is not ill-appearing, toxic-appearing or diaphoretic.  HENT:     Head: Normocephalic and atraumatic.     Nose: Nose normal.     Mouth/Throat:     Mouth: Mucous membranes are moist.  Eyes:     Extraocular Movements: Extraocular movements intact.     Pupils: Pupils are equal, round, and reactive to light.  Cardiovascular:     Rate and Rhythm: Normal rate and regular rhythm.     Pulses: Normal pulses.     Heart sounds: Normal heart sounds. No murmur heard.   No friction rub. No gallop.  Pulmonary:     Effort: Pulmonary effort is normal. No respiratory distress.     Breath sounds: Normal breath sounds. No stridor. No wheezing, rhonchi or rales.  Skin:  General: Skin is warm and dry.     Findings: No rash.  Neurological:     Mental Status: She is alert and oriented to person, place, and time.     Cranial Nerves: No cranial nerve deficit.     Motor: No weakness.     Coordination: Coordination normal.     Gait: Gait normal.     Deep Tendon Reflexes: Reflexes normal.     Comments: Negative Romberg and pronator drift.  No facial droop.  Psychiatric:        Mood and Affect: Mood normal.        Behavior: Behavior normal.        Thought Content: Thought content normal.        Judgment: Judgment normal.    Results for orders placed or performed during the hospital encounter of 06/06/21 (from the past 24 hour(s))  POC Urinalysis dipstick     Status: Abnormal    Collection Time: 06/06/21 12:22 PM  Result Value Ref Range   Glucose, UA NEGATIVE NEGATIVE mg/dL   Bilirubin Urine NEGATIVE NEGATIVE   Ketones, ur NEGATIVE NEGATIVE mg/dL   Specific Gravity, Urine 1.020 1.005 - 1.030   Hgb urine dipstick NEGATIVE NEGATIVE   pH 7.0 5.0 - 8.0   Protein, ur NEGATIVE NEGATIVE mg/dL   Urobilinogen, UA 2.0 (H) 0.0 - 1.0 mg/dL   Nitrite NEGATIVE NEGATIVE   Leukocytes,Ua NEGATIVE NEGATIVE  POC urine pregnancy     Status: None   Collection Time: 06/06/21 12:24 PM  Result Value Ref Range   Preg Test, Ur NEGATIVE NEGATIVE    Assessment and Plan :   PDMP not reviewed this encounter.  1. BV (bacterial vaginosis)   2. Essential hypertension   3. Encounter for medication refill   4. Hypertensive urgency     Address patient's primary concern of recurrent bacterial vaginosis with clindamycin.  I also suspect yeast infection given her vaginal itching and irritation and therefore will cover for this with Diflucan.  Labs pending.  She does have hypertensive urgency with severely elevated blood pressure.  However, she is asymptomatic in clinic and her physical exam findings are unremarkable.  Emphasized need for strict hypertensive friendly diet, urgent control.  Refilled her hydrochlorothiazide.  Recommended frequent blood pressure checks at least 3-4 times a week.  Emphasized need for close follow-up with her PCP, currently the appointment sits at a month from now. Counseled patient on potential for adverse effects with medications prescribed/recommended today, ER and return-to-clinic precautions discussed, patient verbalized understanding.    Wallis Bamberg, PA-C 06/06/21 1300

## 2021-06-06 NOTE — Discharge Instructions (Addendum)

## 2021-06-07 LAB — CERVICOVAGINAL ANCILLARY ONLY
Bacterial Vaginitis (gardnerella): POSITIVE — AB
Candida Glabrata: NEGATIVE
Candida Vaginitis: POSITIVE — AB
Chlamydia: NEGATIVE
Comment: NEGATIVE
Comment: NEGATIVE
Comment: NEGATIVE
Comment: NEGATIVE
Comment: NEGATIVE
Comment: NORMAL
Neisseria Gonorrhea: NEGATIVE
Trichomonas: NEGATIVE

## 2021-06-12 ENCOUNTER — Encounter: Payer: Self-pay | Admitting: *Deleted

## 2021-08-26 ENCOUNTER — Other Ambulatory Visit: Payer: Self-pay

## 2021-08-26 ENCOUNTER — Ambulatory Visit (HOSPITAL_COMMUNITY)
Admission: EM | Admit: 2021-08-26 | Discharge: 2021-08-26 | Disposition: A | Discharge status: Home / Self Care | Payer: Medicaid Other | Attending: Student | Admitting: Student

## 2021-08-26 ENCOUNTER — Encounter (HOSPITAL_COMMUNITY): Payer: Self-pay | Admitting: *Deleted

## 2021-08-26 DIAGNOSIS — N939 Abnormal uterine and vaginal bleeding, unspecified: Secondary | ICD-10-CM | POA: Insufficient documentation

## 2021-08-26 HISTORY — DX: Essential (primary) hypertension: I10

## 2021-08-26 LAB — POC URINE PREG, ED: Preg Test, Ur: NEGATIVE

## 2021-08-26 MED ORDER — MEGESTROL ACETATE 40 MG PO TABS: 40.0000 mg | ORAL_TABLET | Freq: Two times a day (BID) | ORAL | 0 refills | Status: AC

## 2021-08-26 NOTE — Discharge Instructions (Addendum)
-  Megace twice daily for up to 21 days, please follow-up with gynecology before stopping this medication, information below. You could also ask your PCP for this referral. -We are testing for BV, yeast, gonorrhea, chlamydia, trichomonas, we will call you in about 2 days if anything is positive. -Seek additional medical attention if symptoms getting worse instead better, like abdominal pain, bleeding, new symptoms like dizziness, weakness, etc.

## 2021-08-26 NOTE — ED Provider Notes (Signed)
[facility]    CSN: [id number] Arrival date & time: 08/26/21  1156      History   Chief Complaint Chief Complaint  Patient presents with   Vaginal Bleeding    HPI Angela Manning is a 35 y.o. female presenting with heavy vaginal bleeding x3 days, her last period was 2 weeks ago and states this is abnormal for her. Also with history BV, ovarian cyst. Going through 1 pad or tampon every hour. Mild crampy lower abd pain. Denies hematuria, dysuria, frequency, urgency, back pain, n/v/d, fevers/chills, abdnormal vaginal discharge, vaginal rashes/lesions. Same female partner, denies STI risk.   HPI  Past Medical History:  Diagnosis Date   Bacterial vaginosis    Hypertension    Ovarian cyst    Pregnancy induced hypertension     Patient Active Problem List   Diagnosis Date Noted   Generalized anxiety disorder 04/19/2021   Polydipsia 04/19/2021   Essential hypertension 03/03/2020   Anxiety 03/03/2020   Bacterial vaginosis 03/03/2020   Supervision of high risk pregnancy, antepartum 10/27/2019   Hypertension affecting pregnancy, antepartum 10/27/2019   Nausea 05/09/2017   Elevated blood pressure reading 05/09/2017   Elevated serum hCG 03/12/2016   Ovarian cyst, left 03/01/2015   Abnormal uterine bleeding (AUB) 03/01/2015   Dyspareunia 03/01/2015   Smoker 10/23/2014   Retained products of conception with hemorrhage 02/21/2013    Past Surgical History:  Procedure Laterality Date   DILATION AND EVACUATION N/A 02/21/2013   Procedure: DILATATION AND EVACUATION;  Surgeon: Reva Bores, MD;  Location: [facility];  Service: Gynecology;  Laterality: N/A;    OB History     Gravida  5   Para  4   Term  3   Preterm  1   AB      Living  4      SAB      IAB      Ectopic      Multiple      Live Births  4            Home Medications    Prior to Admission medications   Medication Sig Start Date End Date Taking? Authorizing Provider   hydrochlorothiazide (HYDRODIURIL) 25 MG tablet Take 1 tablet (25 mg total) by mouth daily. 06/06/21  Yes Wallis Bamberg, PA-C  megestrol (MEGACE) 40 MG tablet Take 1 tablet (40 mg total) by mouth 2 (two) times daily for 21 days. 08/26/21 09/16/21 Yes Rhys Martini, PA-C  clindamycin (CLEOCIN) 300 MG capsule Take 1 capsule (300 mg total) by mouth 2 (two) times daily. 06/06/21   Wallis Bamberg, PA-C  fluconazole (DIFLUCAN) 150 MG tablet Take 1 tablet (150 mg total) by mouth once a week. 06/06/21   Wallis Bamberg, PA-C  FLUoxetine (PROZAC) 10 MG capsule Take 1 capsule (10 mg total) by mouth daily. 04/19/21   Verdene Lennert, MD  norethindrone (MICRONOR,CAMILA,ERRIN) 0.35 MG tablet Take 1 tablet (0.35 mg total) by mouth daily. Patient not taking: Reported on 08/23/2019 10/07/18 09/29/19  Conan Bowens, MD    Family History Family History  Problem Relation Age of Onset   Hypertension Mother    Healthy Father     Social History Social History   Tobacco Use   Smoking status: Former    Packs/day: 0.25    Types: Cigarettes   Smokeless tobacco: Former    Quit date: 01/08/2015   Tobacco comments:    quit 3 years ago   Vaping Use  Vaping Use: Never used  Substance Use Topics   Alcohol use: Not Currently   Drug use: No     Allergies   Flagyl [metronidazole]   Review of Systems Review of Systems  Gastrointestinal:  Positive for abdominal pain.  Genitourinary:  Positive for menstrual problem.  All other systems reviewed and are negative.   Physical Exam Triage Vital Signs ED Triage Vitals  Enc Vitals Group     BP 08/26/21 1247 (!) 156/110     Pulse Rate 08/26/21 1247 73     Resp 08/26/21 1247 14     Temp 08/26/21 1247 98.2 F (36.8 C)     Temp Source 08/26/21 1247 Oral     SpO2 08/26/21 1247 97 %     Weight --      Height --      Head Circumference --      Peak Flow --      Pain Score 08/26/21 1249 0     Pain Loc --      Pain Edu? --      Excl. in GC? --    No data  found.  Updated Vital Signs BP (!) 148/106   Pulse 73   Temp 98.2 F (36.8 C) (Oral)   Resp 14   LMP 08/23/2021 (Exact Date)   SpO2 97%   Visual Acuity Right Eye Distance:   Left Eye Distance:   Bilateral Distance:    Right Eye Near:   Left Eye Near:    Bilateral Near:     Physical Exam Vitals reviewed.  Constitutional:      General: She is not in acute distress.    Appearance: Normal appearance. She is not ill-appearing.  HENT:     Head: Normocephalic and atraumatic.     Mouth/Throat:     Mouth: Mucous membranes are moist.     Comments: Moist mucous membranes Eyes:     Extraocular Movements: Extraocular movements intact.     Pupils: Pupils are equal, round, and reactive to light.  Cardiovascular:     Rate and Rhythm: Normal rate and regular rhythm.     Heart sounds: Normal heart sounds.  Pulmonary:     Effort: Pulmonary effort is normal.     Breath sounds: Normal breath sounds. No wheezing, rhonchi or rales.  Abdominal:     General: Bowel sounds are normal. There is no distension.     Palpations: Abdomen is soft. There is no mass.     Tenderness: There is no abdominal tenderness. There is no right CVA tenderness, left CVA tenderness, guarding or rebound. Negative signs include Murphy's sign, Rovsing's sign and McBurney's sign.  Skin:    General: Skin is warm.     Capillary Refill: Capillary refill takes less than 2 seconds.     Comments: Good skin turgor  Neurological:     General: No focal deficit present.     Mental Status: She is alert and oriented to person, place, and time.  Psychiatric:        Mood and Affect: Mood normal.        Behavior: Behavior normal.     UC Treatments / Results  Labs (all labs ordered are listed, but only abnormal results are displayed) Labs Reviewed  POC URINE PREG, ED  CERVICOVAGINAL ANCILLARY ONLY    EKG   Radiology No results found.  Procedures Procedures (including critical care time)  Medications Ordered in  UC Medications - No data to display  Initial  Impression / Assessment and Plan / UC Course  I have reviewed the triage vital signs and the nursing notes.  Pertinent labs & imaging results that were available during my care of the patient were reviewed by me and considered in my medical decision making (see chart for details).     This patient is a very pleasant 36 y.o. year old female presenting with vaginal bleeding. Afebrile, nontachycardic, no reproducible abd pain or CVAT.  History BV 05/2021.  Denies STI risk. Will send self-swab for G/C, trich, yeast, BV testing. Declines HIV, RPR. Safe sex precautions.  Urine pregnancy negative.  Megace, f/u with gyn.  ED return precautions discussed. Patient verbalizes understanding and agreement.   Coding Level 4 for review of past notes/labs, order and interpretation of labs today, and prescription drug management   Final Clinical Impressions(s) / UC Diagnoses   Final diagnoses:  Abnormal uterine bleeding (AUB)     Discharge Instructions      -Megace twice daily for up to 21 days, please follow-up with gynecology before stopping this medication, information below. You could also ask your PCP for this referral. -We are testing for BV, yeast, gonorrhea, chlamydia, trichomonas, we will call you in about 2 days if anything is positive. -Seek additional medical attention if symptoms getting worse instead better, like abdominal pain, bleeding, new symptoms like dizziness, weakness, etc.     ED Prescriptions     Medication Sig Dispense Auth. Provider   megestrol (MEGACE) 40 MG tablet Take 1 tablet (40 mg total) by mouth 2 (two) times daily for 21 days. 42 tablet Rhys Martini, PA-C      PDMP not reviewed this encounter.   Rhys Martini, PA-C 08/26/21 1332

## 2021-08-26 NOTE — ED Triage Notes (Signed)
Pt states wasn't due for her period for another 2 wks; reports heavy vaginal bleeding onset 08/23/21 with clotting. States going through approx "every hour or so". States having some minimal low abd cramping.

## 2021-08-28 ENCOUNTER — Telehealth (HOSPITAL_COMMUNITY): Payer: Self-pay | Admitting: Emergency Medicine

## 2021-08-28 LAB — CERVICOVAGINAL ANCILLARY ONLY
Bacterial Vaginitis (gardnerella): POSITIVE — AB
Candida Glabrata: NEGATIVE
Candida Vaginitis: NEGATIVE
Chlamydia: NEGATIVE
Comment: NEGATIVE
Comment: NEGATIVE
Comment: NEGATIVE
Comment: NEGATIVE
Comment: NEGATIVE
Comment: NORMAL
Neisseria Gonorrhea: NEGATIVE
Trichomonas: NEGATIVE

## 2021-08-28 MED ORDER — CLINDAMYCIN HCL 150 MG PO CAPS: 300.0000 mg | ORAL_CAPSULE | Freq: Two times a day (BID) | ORAL | 0 refills | Status: AC

## 2021-09-10 DIAGNOSIS — Z79899 Other long term (current) drug therapy: Secondary | ICD-10-CM | POA: Diagnosis not present

## 2021-09-12 DIAGNOSIS — Z79899 Other long term (current) drug therapy: Secondary | ICD-10-CM | POA: Diagnosis not present

## 2021-09-17 DIAGNOSIS — Z79899 Other long term (current) drug therapy: Secondary | ICD-10-CM | POA: Diagnosis not present

## 2021-09-19 DIAGNOSIS — Z79899 Other long term (current) drug therapy: Secondary | ICD-10-CM | POA: Diagnosis not present

## 2021-09-24 DIAGNOSIS — Z79899 Other long term (current) drug therapy: Secondary | ICD-10-CM | POA: Diagnosis not present

## 2021-09-25 ENCOUNTER — Other Ambulatory Visit (HOSPITAL_COMMUNITY)
Admission: RE | Admit: 2021-09-25 | Discharge: 2021-09-25 | Disposition: A | Discharge status: Home / Self Care | Payer: Medicaid Other | Source: Ambulatory Visit | Attending: Student in an Organized Health Care Education/Training Program | Admitting: Student in an Organized Health Care Education/Training Program

## 2021-09-25 ENCOUNTER — Ambulatory Visit (INDEPENDENT_AMBULATORY_CARE_PROVIDER_SITE_OTHER): Payer: Medicaid Other | Admitting: Internal Medicine

## 2021-09-25 ENCOUNTER — Encounter: Payer: Self-pay | Admitting: Internal Medicine

## 2021-09-25 ENCOUNTER — Other Ambulatory Visit: Payer: Self-pay

## 2021-09-25 VITALS — BP 159/119 | HR 84 | Temp 98.7°F | Resp 32 | Ht 66.0 in | Wt 135.9 lb

## 2021-09-25 DIAGNOSIS — I10 Essential (primary) hypertension: Secondary | ICD-10-CM | POA: Diagnosis not present

## 2021-09-25 DIAGNOSIS — N939 Abnormal uterine and vaginal bleeding, unspecified: Secondary | ICD-10-CM | POA: Diagnosis not present

## 2021-09-25 DIAGNOSIS — N921 Excessive and frequent menstruation with irregular cycle: Secondary | ICD-10-CM | POA: Diagnosis not present

## 2021-09-25 MED ORDER — AMLODIPINE BESYLATE 5 MG PO TABS: 5.0000 mg | ORAL_TABLET | Freq: Every day | ORAL | 11 refills | Status: DC

## 2021-09-25 NOTE — Progress Notes (Signed)
   CC: Abnormal vaginal bleeding  HPI:  [name] is a 35 y.o. female with past medical history as stated below presenting for evaluation of abnormal vaginal bleeding.  She reports streaks of dark-colored blood since 10/1.  Endorsing fatigue for the past week.  Please see problem based charting for complete assessment plan  Past Medical History:  Diagnosis Date   Bacterial vaginosis    Hypertension    Hypertension affecting pregnancy, antepartum 10/27/2019   Ovarian cyst    Pregnancy induced hypertension    Review of Systems: Negative except as stated in HPI  Physical Exam:  Vitals:   09/25/21 1604 09/25/21 1610  BP: (!) 162/119 (!) 159/119  Pulse: 82 84  Resp: (!) 32   Temp: 98.7 F (37.1 C)   TempSrc: Oral   SpO2: 100%   Weight: 135 lb 14.4 oz (61.6 kg)   Height: 5\' 6"  (1.676 m)    Physical Exam  Constitutional: Appears well-developed and well-nourished. No distress.  HENT: Normocephalic and atraumatic Cardiovascular: Normal rate, regular rhythm, S1 and S2 present, no murmurs, rubs, gallops.  Distal pulses intact Respiratory: Lungs are clear to auscultation bilaterally. GI: Nondistended, soft, nontender to palpation, normal bowel sounds Musculoskeletal: Normal bulk and tone.  GU: Normal external genitalia without any lesions noted, dark blood noted in the vaginal canal and from cervix; no abnormal vaginal discharge Skin: Warm and dry.  No rash, erythema, lesions noted. Psychiatric: Normal mood and affect. Behavior is normal. Judgment and thought content normal.    Assessment & Plan:   See Encounters Tab for problem based charting.  Patient discussed with Dr. 

## 2021-09-25 NOTE — Assessment & Plan Note (Addendum)
Patient is presenting for evaluation of abnormal vaginal bleeding.  Patient reports that her menses usually start around the 25th of each month.  Notes her last normal menstrual cycle was August 25.  Following this, in September she noted to have abnormal uterine bleeding around the 15th for which she was evaluated in the ED on the 18th.  Cervicovaginal testing was positive for BV.  Patient was recommended to start Megace and follow-up with her OB/GYN.  She notes that this episode subsided over the next 3 days and she never took the Megace. She notes that since 10/1, she has been having dark brown discharge.  She notes that this is mostly when she wipes and she also notices streaks on her undergarments.  However, has not saturated any pads during this time. She denies any abdominal cramping but does note generalized fatigue and "not feeling like herself".   On exam, there is dark blood in the vaginal canal and noted to be coming from the cervix.  No abnormal discharge noted.  Plan -Cervicovaginal ancillary testing and Pap testing -TSH -CBC with iron studies to evaluate for iron deficiency anemia in setting of chronic slow vaginal bleeding -Discussed with patient starting OCPs; agreeable pending lab results.   ADDENDUM: Cervicovaginal testing with Candida glabrata which would present more so with vulvovaginitis symptoms. In setting of ongoing vaginal bleeding and absence of vaginal itching or abnormal malodorous discharge; dx most consistent with menorrhagia. Patient prescribed OCPs and referral to gyn placed per pt request

## 2021-09-25 NOTE — Assessment & Plan Note (Signed)
BP Readings from Last 3 Encounters:  09/25/21 (!) 159/119  08/26/21 (!) 148/106  06/06/21 (S) (!) 208/112   Angela Manning has a history of hypertension since at least 35 years of age and started requiring hypertensives for gestational hypertension.  She is currently on hydrochlorothiazide 25 mg daily and notes that her blood pressure is persistently elevated with systolic in the 150s at home.  Patient was previously being worked up for secondary hypertension.  Renin and aldosterone ratio normal.  Renal artery stenosis ultrasound was ordered; however, patient has not been able to get this scheduled. She denies any headaches, lightheadedness/dizziness, chest pain, palpitations, shortness of breath or focal weakness.  Given persistent hypertension, patient would benefit from second antihypertensive agent while being evaluated for secondary causes of hypertension.  Plan -Start amlodipine 5 mg daily; continue HCTZ 25 mg -Follow-up renal artery stenosis ultrasound -Follow-up in 2 weeks for BP check

## 2021-09-25 NOTE — Patient Instructions (Addendum)
[name],  It was a pleasure seeing you in clinic. Today we discussed:   Abnormal periods:  We are checking labs to evaluate for causes of this and if this may be contributing to your fatigue. We can also consider oral contraceptives   Hypertension:  At this time, please start amlodipine 5mg  daily. Continue HCTZ 25mg  daily. Follow up in 2 weeks for BP check.   If you have any questions or concerns, please call our clinic at [phone] between 9am-5pm and after hours call [phone] and ask for the internal medicine resident on call. If you feel you are having a medical emergency please call 911.   Thank you, we look forward to helping you remain healthy!

## 2021-09-26 DIAGNOSIS — Z79899 Other long term (current) drug therapy: Secondary | ICD-10-CM | POA: Diagnosis not present

## 2021-09-26 LAB — CERVICOVAGINAL ANCILLARY ONLY
Bacterial Vaginitis (gardnerella): NEGATIVE
Candida Glabrata: POSITIVE — AB
Candida Vaginitis: NEGATIVE
Chlamydia: NEGATIVE
Comment: NEGATIVE
Comment: NEGATIVE
Comment: NEGATIVE
Comment: NEGATIVE
Comment: NEGATIVE
Comment: NORMAL
Neisseria Gonorrhea: NEGATIVE
Trichomonas: NEGATIVE

## 2021-09-26 LAB — CBC
Hematocrit: 45.9 % (ref 34.0–46.6)
Hemoglobin: 15.2 g/dL (ref 11.1–15.9)
MCH: 27.6 pg (ref 26.6–33.0)
MCHC: 33.1 g/dL (ref 31.5–35.7)
MCV: 83 fL (ref 79–97)
Platelets: 234 10*3/uL (ref 150–450)
RBC: 5.51 x10E6/uL — ABNORMAL HIGH (ref 3.77–5.28)
RDW: 13.4 % (ref 11.7–15.4)
WBC: 5.1 10*3/uL (ref 3.4–10.8)

## 2021-09-26 LAB — TSH: TSH: 0.808 u[IU]/mL (ref 0.450–4.500)

## 2021-09-26 LAB — IRON,TIBC AND FERRITIN PANEL
Ferritin: 32 ng/mL (ref 15–150)
Iron Saturation: 32 % (ref 15–55)
Iron: 127 ug/dL (ref 27–159)
Total Iron Binding Capacity: 392 ug/dL (ref 250–450)
UIBC: 265 ug/dL (ref 131–425)

## 2021-09-26 NOTE — Progress Notes (Signed)
Internal Medicine Clinic Attending ° °Case discussed with Dr. Aslam  At the time of the visit.  We reviewed the resident’s history and exam and pertinent patient test results.  I agree with the assessment, diagnosis, and plan of care documented in the resident’s note.  °

## 2021-09-27 MED ORDER — NORGESTIMATE-ETH ESTRADIOL 0.25-35 MG-MCG PO TABS: 1.0000 | ORAL_TABLET | Freq: Every day | ORAL | 11 refills | Status: DC

## 2021-09-27 NOTE — Addendum Note (Signed)
Addended by: Eliezer Bottom on: 09/27/2021 12:18 PM   Modules accepted: Orders

## 2021-09-28 LAB — CYTOLOGY - PAP
Adequacy: ABSENT
Comment: NEGATIVE
Diagnosis: NEGATIVE
High risk HPV: POSITIVE — AB

## 2021-10-01 ENCOUNTER — Telehealth: Payer: Self-pay

## 2021-10-01 DIAGNOSIS — Z79899 Other long term (current) drug therapy: Secondary | ICD-10-CM | POA: Diagnosis not present

## 2021-10-01 NOTE — Telephone Encounter (Signed)
Results discussed with patient. Will follow up on referral to gyn

## 2021-10-01 NOTE — Telephone Encounter (Signed)
Requesting test results, please call pt back.  

## 2021-10-03 DIAGNOSIS — Z79899 Other long term (current) drug therapy: Secondary | ICD-10-CM | POA: Diagnosis not present

## 2021-10-08 ENCOUNTER — Ambulatory Visit: Payer: Medicaid Other | Admitting: Internal Medicine

## 2021-10-08 DIAGNOSIS — I10 Essential (primary) hypertension: Secondary | ICD-10-CM | POA: Diagnosis not present

## 2021-10-08 DIAGNOSIS — N921 Excessive and frequent menstruation with irregular cycle: Secondary | ICD-10-CM | POA: Diagnosis not present

## 2021-10-08 DIAGNOSIS — Z79899 Other long term (current) drug therapy: Secondary | ICD-10-CM | POA: Diagnosis not present

## 2021-10-08 MED ORDER — HYDROCHLOROTHIAZIDE 25 MG PO TABS: 25.0000 mg | ORAL_TABLET | Freq: Every day | ORAL | 3 refills | Status: DC

## 2021-10-08 MED ORDER — AMLODIPINE BESYLATE 10 MG PO TABS: 10.0000 mg | ORAL_TABLET | Freq: Every day | ORAL | 3 refills | Status: DC

## 2021-10-08 NOTE — Assessment & Plan Note (Signed)
Patient previously presented for evaluation of menorrhagia.  Patient was prescribed Sprintec at that time.  Pap cytology positive for high risk HPV.  Patient reports that she continues to have vaginal bleeding but that her current bleeding is more consistent with her periods. She does have appointment scheduled for 11/15 with OB/GYN.  Recommended patient to keep this appointment.  Plan Continue OCP until evaluated by OB/Gynn May need colposcopy for evaluation of high risk HPV

## 2021-10-08 NOTE — Progress Notes (Signed)
   CC: hypertension follow up  HPI:  Angela Manning is a 35 y.o. female with PMHX as stated below presenting for follow up of her hypertension. She denies any acute concerns at this time. Please see problem based charting for complete assessment and plan.  Past Medical History:  Diagnosis Date   Bacterial vaginosis    Hypertension    Hypertension affecting pregnancy, antepartum 10/27/2019   Ovarian cyst    Pregnancy induced hypertension    Review of Systems:  Negative except as stated in HPI  Physical Exam:  Vitals:   10/08/21 1044  BP: (!) 147/106  Pulse: 83  SpO2: 100%   Physical Exam  Constitutional: Appears well-developed and well-nourished. No distress.  HENT: Normocephalic and atraumatic Cardiovascular: Normal rate, regular rhythm, S1 and S2 present, no murmurs, rubs, gallops.  Distal pulses intact Respiratory: Effort is normal.  Lungs are clear to auscultation bilaterally. Musculoskeletal: Normal bulk and tone.  Neurological: Is alert and oriented x4, no apparent focal deficits noted. Skin: Warm and dry.  No rash, erythema, lesions noted. Psychiatric: Normal mood and affect.   Assessment & Plan:   See Encounters Tab for problem based charting.  Patient discussed with Dr. Antony Contras

## 2021-10-08 NOTE — Patient Instructions (Addendum)
Angela Manning  It was a pleasure seeing you in clinic. Today we discussed:   High blood pressure: At this time, please increase your amlodipine to 10mg  daily. Continue HCTZ 25mg  daily.  Please keep a BP log at home and bring to your next appointment. Please schedule the renal ultrasound at your earliest convenience. Follow up in 2-4 weeks for BP check  If you have any questions or concerns, please call our clinic at [phone] between 9am-5pm and after hours call [phone] and ask for the internal medicine resident on call. If you feel you are having a medical emergency please call 911.   Thank you, we look forward to helping you remain healthy!

## 2021-10-08 NOTE — Assessment & Plan Note (Addendum)
BP Readings from Last 3 Encounters:  10/08/21 (!) 147/106  09/25/21 (!) 159/119  08/26/21 (!) 148/106   Angela Manning is presenting for follow-up of her hypertension.  She is on amlodipine 5 mg daily and HCTZ 25 mg daily.  She reports medication adherence and denies any headaches, lightheadedness/dizziness, chest pain, shortness of breath, focal weakness.  She reports that she takes her blood pressure at home and her systolic blood pressure is usually in the 140s and diastolic in the 90s. Patient with history of gestational hypertension since age of 34; has been treated with antihypertensives since then.  Renin/aldosterone level within normal limits.  Patient has not been able to complete the renal ultrasound yet.  She does report some snoring but denies any PND or reported apneic episodes. In setting of hypertension at young age, would continue with secondary hypertension work-up.  Plan Increase amlodipine to 10 mg daily, continue HCTZ 25 mg daily Patient recommended to keep BP log Follow-up renal vascular ultrasound Split-night sleep study to evaluate for OSA Follow-up in 4 weeks for BP check

## 2021-10-09 ENCOUNTER — Ambulatory Visit (HOSPITAL_COMMUNITY): Admission: RE | Admit: 2021-10-09 | Payer: Medicaid Other | Source: Ambulatory Visit

## 2021-10-09 ENCOUNTER — Other Ambulatory Visit: Payer: Self-pay

## 2021-10-09 ENCOUNTER — Emergency Department (HOSPITAL_COMMUNITY)
Admission: EM | Admit: 2021-10-09 | Discharge: 2021-10-10 | Disposition: A | Discharge status: Home / Self Care | Payer: Medicaid Other | Attending: Emergency Medicine | Admitting: Emergency Medicine

## 2021-10-09 ENCOUNTER — Encounter (HOSPITAL_COMMUNITY): Payer: Self-pay

## 2021-10-09 DIAGNOSIS — N938 Other specified abnormal uterine and vaginal bleeding: Secondary | ICD-10-CM | POA: Insufficient documentation

## 2021-10-09 DIAGNOSIS — N939 Abnormal uterine and vaginal bleeding, unspecified: Secondary | ICD-10-CM | POA: Diagnosis not present

## 2021-10-09 DIAGNOSIS — Z5321 Procedure and treatment not carried out due to patient leaving prior to being seen by health care provider: Secondary | ICD-10-CM | POA: Insufficient documentation

## 2021-10-09 DIAGNOSIS — N9489 Other specified conditions associated with female genital organs and menstrual cycle: Secondary | ICD-10-CM | POA: Insufficient documentation

## 2021-10-09 DIAGNOSIS — R531 Weakness: Secondary | ICD-10-CM | POA: Diagnosis not present

## 2021-10-09 LAB — COMPREHENSIVE METABOLIC PANEL
ALT: 16 U/L (ref 0–44)
AST: 18 U/L (ref 15–41)
Albumin: 4 g/dL (ref 3.5–5.0)
Alkaline Phosphatase: 66 U/L (ref 38–126)
Anion gap: 8 (ref 5–15)
BUN: 11 mg/dL (ref 6–20)
CO2: 25 mmol/L (ref 22–32)
Calcium: 9.4 mg/dL (ref 8.9–10.3)
Chloride: 100 mmol/L (ref 98–111)
Creatinine, Ser: 0.69 mg/dL (ref 0.44–1.00)
GFR, Estimated: >60 mL/min (ref >60)
Glucose, Bld: 96 mg/dL (ref 70–99)
Potassium: 3.3 mmol/L — ABNORMAL LOW (ref 3.5–5.1)
Sodium: 133 mmol/L — ABNORMAL LOW (ref 135–145)
Total Bilirubin: 0.4 mg/dL (ref 0.3–1.2)
Total Protein: 7.9 g/dL (ref 6.5–8.1)

## 2021-10-09 LAB — CBC WITH DIFFERENTIAL/PLATELET
Abs Immature Granulocytes: 0.01 10*3/uL (ref 0.00–0.07)
Basophils Absolute: 0.1 10*3/uL (ref 0.0–0.1)
Basophils Relative: 1 %
Eosinophils Absolute: 0.2 10*3/uL (ref 0.0–0.5)
Eosinophils Relative: 3 %
HCT: 42.9 % (ref 36.0–46.0)
Hemoglobin: 13.9 g/dL (ref 12.0–15.0)
Immature Granulocytes: 0 %
Lymphocytes Relative: 34 %
Lymphs Abs: 2.2 10*3/uL (ref 0.7–4.0)
MCH: 27.5 pg (ref 26.0–34.0)
MCHC: 32.4 g/dL (ref 30.0–36.0)
MCV: 85 fL (ref 80.0–100.0)
Monocytes Absolute: 0.9 10*3/uL (ref 0.1–1.0)
Monocytes Relative: 13 %
Neutro Abs: 3.2 10*3/uL (ref 1.7–7.7)
Neutrophils Relative %: 49 %
Platelets: 295 10*3/uL (ref 150–400)
RBC: 5.05 MIL/uL (ref 3.87–5.11)
RDW: 13.6 % (ref 11.5–15.5)
WBC: 6.5 10*3/uL (ref 4.0–10.5)
nRBC: 0 % (ref 0.0–0.2)

## 2021-10-09 LAB — I-STAT BETA HCG BLOOD, ED (MC, WL, AP ONLY): I-stat hCG, quantitative: <5 m[IU]/mL (ref <5)

## 2021-10-09 NOTE — ED Provider Notes (Signed)
Emergency Medicine Provider Triage Evaluation Note  Angela Manning , a 35 y.o. female  was evaluated in triage.  Pt complains of vaginal bleeding for 1 month.  Seen her PCP for it, given gynecology referral for November 15.  Patient feels very weak, diffuse abdominal pain.  Nausea but no vomiting.  Not on birth control.  Review of Systems  Positive: Vaginal bleeding Negative: Vomiting  Physical Exam  BP (!) 144/100 (BP Location: Left Arm)   Pulse 86   Temp 98 F (36.7 C) (Oral)   Resp 16   SpO2 97%  Gen:   Awake, no distress   Resp:  Normal effort  MSK:   Moves extremities without difficulty  Other:  Abdomen is soft not particularly tender.  Pelvic exam deferred till patient is in the room in the back  Medical Decision Making  Medically screening exam initiated at 10:11 PM.  Appropriate orders placed.  ALYSSIA HEESE was informed that the remainder of the evaluation will be completed by another provider, this initial triage assessment does not replace that evaluation, and the importance of remaining in the ED until their evaluation is complete.  Abnormal uterine bleeding   Theron Arista, Cordelia Poche 10/09/21 2212    Linwood Dibbles, MD 10/09/21 2330

## 2021-10-09 NOTE — ED Triage Notes (Signed)
Pt reports that she has had brown vaginal bleeding since October 1st. Pt states that she is now on her normal cycle. Pt reports feeling weak.

## 2021-10-10 DIAGNOSIS — Z79899 Other long term (current) drug therapy: Secondary | ICD-10-CM | POA: Diagnosis not present

## 2021-10-15 DIAGNOSIS — Z79899 Other long term (current) drug therapy: Secondary | ICD-10-CM | POA: Diagnosis not present

## 2021-10-17 DIAGNOSIS — Z79899 Other long term (current) drug therapy: Secondary | ICD-10-CM | POA: Diagnosis not present

## 2021-10-22 DIAGNOSIS — Z79899 Other long term (current) drug therapy: Secondary | ICD-10-CM | POA: Diagnosis not present

## 2021-10-22 NOTE — Progress Notes (Signed)
Internal Medicine Clinic Attending ° °Case discussed with Dr. Aslam  At the time of the visit.  We reviewed the resident’s history and exam and pertinent patient test results.  I agree with the assessment, diagnosis, and plan of care documented in the resident’s note.  °

## 2021-10-23 ENCOUNTER — Ambulatory Visit (INDEPENDENT_AMBULATORY_CARE_PROVIDER_SITE_OTHER): Payer: Medicaid Other | Admitting: Family Medicine

## 2021-10-23 ENCOUNTER — Other Ambulatory Visit: Payer: Self-pay

## 2021-10-23 ENCOUNTER — Encounter: Payer: Self-pay | Admitting: Family Medicine

## 2021-10-23 ENCOUNTER — Other Ambulatory Visit (HOSPITAL_COMMUNITY)
Admission: RE | Admit: 2021-10-23 | Discharge: 2021-10-23 | Disposition: A | Discharge status: Home / Self Care | Payer: Medicaid Other | Source: Ambulatory Visit | Attending: Family Medicine | Admitting: Family Medicine

## 2021-10-23 VITALS — BP 132/100 | HR 91 | Ht 66.0 in | Wt 132.9 lb

## 2021-10-23 DIAGNOSIS — N921 Excessive and frequent menstruation with irregular cycle: Secondary | ICD-10-CM

## 2021-10-23 DIAGNOSIS — Z113 Encounter for screening for infections with a predominantly sexual mode of transmission: Secondary | ICD-10-CM | POA: Insufficient documentation

## 2021-10-23 DIAGNOSIS — R87619 Unspecified abnormal cytological findings in specimens from cervix uteri: Secondary | ICD-10-CM

## 2021-10-23 DIAGNOSIS — I10 Essential (primary) hypertension: Secondary | ICD-10-CM | POA: Diagnosis not present

## 2021-10-23 NOTE — Progress Notes (Signed)
   Subjective:    Patient ID: Angela Manning is a 35 y.o. female presenting with Menstrual Problem  on 10/23/2021  HPI: Cycles are regular and have some heaviness in the beginning and then some cramping on first few days. Previously prescribed POPs due to BP. Has trouble remembering her pills. Does not desire pregnancy at this time. Recently bled from October 5-November 1. Bled with dark brown blood. Took some Sprintec and then stopped it as it did not help. On Norvasc for BP. Has had normal cycle this month. Normal TSH and CBC. Recent pap is WNL but with HR HPV--prior pap HGSIL, no colpo  Review of Systems  Constitutional:  Negative for chills and fever.  Respiratory:  Negative for shortness of breath.   Cardiovascular:  Negative for chest pain.  Gastrointestinal:  Negative for abdominal pain, nausea and vomiting.  Genitourinary:  Negative for dysuria.  Skin:  Negative for rash.     Objective:    BP (!) 132/100   Pulse 91   Ht 5\' 6"  (1.676 m)   Wt 132 lb 14.4 oz (60.3 kg)   LMP 10/13/2021 (Exact Date)   BMI 21.45 kg/m  Physical Exam Constitutional:      General: She is not in acute distress.    Appearance: She is well-developed.  HENT:     Head: Normocephalic and atraumatic.  Eyes:     General: No scleral icterus. Cardiovascular:     Rate and Rhythm: Normal rate.  Pulmonary:     Effort: Pulmonary effort is normal.  Abdominal:     Palpations: Abdomen is soft.  Musculoskeletal:     Cervical back: Neck supple.  Skin:    General: Skin is warm and dry.  Neurological:     Mental Status: She is alert and oriented to person, place, and time.        Assessment & Plan:   Problem List Items Addressed This Visit       Unprioritized   Menorrhagia with irregular cycle    Discussed options. Will check pelvic u/s. Also, needs contraception, will return for IUD insertion which would help with bleeding.      Relevant Orders   13/04/2021 PELVIC COMPLETE WITH TRANSVAGINAL    Essential hypertension    Continue Norvasc.      Abnormal Pap smear of cervix    HGSIL in 2019, no colpo NILM with + HR HPV in 2022--will check colpo      Other Visit Diagnoses     Screening for STD (sexually transmitted disease)    -  Primary   Relevant Orders   Cervicovaginal ancillary only( [facility])   Hepatitis B Surface AntiGEN   Hepatitis C Antibody   HIV antibody (with reflex)       Return in about 4 weeks (around 11/20/2021) for colpo, needs U/S-separate IUD insertion visit.  11/22/2021 10/23/2021 8:56 AM

## 2021-10-23 NOTE — Assessment & Plan Note (Signed)
Continue Norvasc

## 2021-10-23 NOTE — Progress Notes (Signed)
October 1-November 5th. States that it was "brown, old blood". Does not feel like this was regular cycles. Reports cycle for November was "normal", stated on 11/5-11/9. Is not currently bleeding, requesting self swabs due to new partner.   Was taking Sprintec for bleeding prescribed by PCP but reports she stopped taking it after 2-3 weeks due to still bleeding. Started taking in October.  PCP treats patient for hypertension. Recently started on Norvasc. Has appointment for follow up next week with PCP.  Wynona Canes, CMA

## 2021-10-23 NOTE — Assessment & Plan Note (Signed)
HGSIL in 2019, no colpo NILM with + HR HPV in 2022--will check colpo

## 2021-10-23 NOTE — Assessment & Plan Note (Signed)
Discussed options. Will check pelvic u/s. Also, needs contraception, will return for IUD insertion which would help with bleeding.

## 2021-10-24 DIAGNOSIS — Z79899 Other long term (current) drug therapy: Secondary | ICD-10-CM | POA: Diagnosis not present

## 2021-10-24 LAB — CERVICOVAGINAL ANCILLARY ONLY
Bacterial Vaginitis (gardnerella): NEGATIVE
Candida Glabrata: NEGATIVE
Candida Vaginitis: POSITIVE — AB
Chlamydia: NEGATIVE
Comment: NEGATIVE
Comment: NEGATIVE
Comment: NEGATIVE
Comment: NEGATIVE
Comment: NEGATIVE
Comment: NORMAL
Neisseria Gonorrhea: NEGATIVE
Trichomonas: NEGATIVE

## 2021-10-24 LAB — HEPATITIS C ANTIBODY: Hep C Virus Ab: 0.2 s/co ratio (ref 0.0–0.9)

## 2021-10-24 LAB — HEPATITIS B SURFACE ANTIGEN: Hepatitis B Surface Ag: NEGATIVE

## 2021-10-24 LAB — HIV ANTIBODY (ROUTINE TESTING W REFLEX): HIV Screen 4th Generation wRfx: NONREACTIVE

## 2021-10-24 MED ORDER — FLUCONAZOLE 150 MG PO TABS: 150.0000 mg | ORAL_TABLET | Freq: Every day | ORAL | 2 refills | Status: DC

## 2021-10-24 NOTE — Addendum Note (Signed)
Addended by: Reva Bores on: 10/24/2021 01:02 PM   Modules accepted: Orders

## 2021-10-25 ENCOUNTER — Encounter: Payer: Medicaid Other | Admitting: Internal Medicine

## 2021-10-29 DIAGNOSIS — Z79899 Other long term (current) drug therapy: Secondary | ICD-10-CM | POA: Diagnosis not present

## 2021-10-31 DIAGNOSIS — Z79899 Other long term (current) drug therapy: Secondary | ICD-10-CM | POA: Diagnosis not present

## 2021-11-05 ENCOUNTER — Ambulatory Visit (HOSPITAL_COMMUNITY): Admission: RE | Admit: 2021-11-05 | Payer: Medicaid Other | Source: Ambulatory Visit

## 2021-11-20 ENCOUNTER — Ambulatory Visit: Payer: Medicaid Other | Admitting: Family Medicine

## 2021-12-17 ENCOUNTER — Ambulatory Visit (INDEPENDENT_AMBULATORY_CARE_PROVIDER_SITE_OTHER): Payer: Medicaid Other | Admitting: Family Medicine

## 2021-12-17 ENCOUNTER — Encounter: Payer: Self-pay | Admitting: Family Medicine

## 2021-12-17 ENCOUNTER — Other Ambulatory Visit (HOSPITAL_COMMUNITY)
Admission: RE | Admit: 2021-12-17 | Discharge: 2021-12-17 | Disposition: A | Discharge status: Home / Self Care | Payer: Medicaid Other | Source: Ambulatory Visit | Attending: Family Medicine | Admitting: Family Medicine

## 2021-12-17 ENCOUNTER — Other Ambulatory Visit: Payer: Self-pay

## 2021-12-17 VITALS — BP 142/106 | HR 93 | Ht 66.0 in | Wt 135.0 lb

## 2021-12-17 DIAGNOSIS — N870 Mild cervical dysplasia: Secondary | ICD-10-CM

## 2021-12-17 DIAGNOSIS — R87619 Unspecified abnormal cytological findings in specimens from cervix uteri: Secondary | ICD-10-CM | POA: Insufficient documentation

## 2021-12-17 LAB — POCT PREGNANCY, URINE: Preg Test, Ur: NEGATIVE

## 2021-12-17 NOTE — Progress Notes (Signed)
° ° °  GYNECOLOGY OFFICE COLPOSCOPY PROCEDURE NOTE  36 y.o. [id number] here for colposcopy for following PAP hx: 2019 PAP HSIL, HPV + 2022 NILM, HPV+  Pregnancy test:  negative Discussed role for HPV in cervical dysplasia, need for surveillance. Discussed in detail regarding   Informed consent and review of risks, benefit and alternatives performed. Written consent given.    Procedure & Findings: Speculum inserted into patient's vagina assuring full view of cervix and vaginal walls. 3 swabs of vinegar solution applied to the cervix and vaginal walls and  colposcope was used to observe both the cervix and vaginal walls. Lugol iodine solution also applied to cervix    Colposcopy adequate? Yes ( Transformation zone visualized in all 360 degree around cervix.) Findings:  With application of vinegar - diffuse shiny, snow white area noted uniformly in all 360 degrees and beyond squamo-columnar junction.  With application of iodine - brown stain noted in diffuse area uniformly  Biopsies obtained at 6 and 12 o'clock positions.  Tenaculum applied to cervix and ECC collected.  All specimens were labeled and sent to pathology.  Tenaculum removed and silver nitrate applied to biopsy and Tenaculum sites, excellent hemostasis noted, and speculum removed.  Pt tolerated well with minimal pain and bleeding.   Patient was given post procedure instructions.  Will follow up pathology and manage accordingly; patient will be contacted with results and recommendations.  Routine preventative health maintenance measures emphasized.   Plan: Biopsies and ECC as noted in procedure note.  - Prediction: CIN1  Dr. Vergie Living present in room and proctored procedure   Angela Mccreedy, MD, MPH OB Fellow, [facility] for [facility], [facility]

## 2021-12-19 LAB — SURGICAL PATHOLOGY

## 2022-01-09 ENCOUNTER — Encounter: Payer: Self-pay | Admitting: *Deleted

## 2022-01-26 ENCOUNTER — Ambulatory Visit (HOSPITAL_COMMUNITY): Payer: Medicaid Other

## 2022-01-31 ENCOUNTER — Encounter: Payer: Medicaid Other | Admitting: Internal Medicine

## 2022-01-31 ENCOUNTER — Encounter: Payer: Self-pay | Admitting: Internal Medicine

## 2022-02-14 ENCOUNTER — Encounter: Payer: Self-pay | Admitting: *Deleted

## 2022-02-21 ENCOUNTER — Other Ambulatory Visit: Payer: Self-pay

## 2022-02-21 ENCOUNTER — Ambulatory Visit (HOSPITAL_COMMUNITY)
Admission: RE | Admit: 2022-02-21 | Discharge: 2022-02-21 | Disposition: A | Discharge status: Home / Self Care | Payer: Medicaid Other | Source: Ambulatory Visit | Attending: Physician Assistant | Admitting: Physician Assistant

## 2022-02-21 ENCOUNTER — Encounter (HOSPITAL_COMMUNITY): Payer: Self-pay

## 2022-02-21 VITALS — BP 127/85 | HR 81 | Temp 98.1°F | Resp 16

## 2022-02-21 DIAGNOSIS — N898 Other specified noninflammatory disorders of vagina: Secondary | ICD-10-CM | POA: Insufficient documentation

## 2022-02-21 MED ORDER — CLINDAMYCIN HCL 300 MG PO CAPS: 300.0000 mg | ORAL_CAPSULE | Freq: Two times a day (BID) | ORAL | 0 refills | Status: AC

## 2022-02-21 NOTE — Discharge Instructions (Signed)
We are treating for bacterial vaginosis.  Take clindamycin 300 mg twice daily for 7 days.  If we need to arrange any additional treatment we will contact you based on your swab result.  Wear loosefitting cotton underwear and use hypoallergenic soaps and detergents.  If your symptoms recur please follow-up with your OB/GYN for further evaluation and management.  If anything worsens and you have high fever, abdominal pain, nausea/vomiting, pelvic pain you need to be seen immediately. ?

## 2022-02-21 NOTE — ED Provider Notes (Signed)
?[facility] ? ? ? ?CSN: [id number] ?Arrival date & time: 02/21/22  [id number] ? ? ?  ? ?History   ?Chief Complaint ?No chief complaint on file. ? ? ?HPI ?Angela Manning is a 36 y.o. female.  ? ?Patient presents today with a 2-day history of vaginal odor and mild discharge.  She reports the discharge is malodorous and thin.  She does report changing her detergent wonders this could contribute to symptoms.  Denies any medication changes, recent antibiotic use, changes to soap.  She has a history of recurrent bacterial vaginosis and states current symptoms are similar to previous episodes of this condition.  She is confident she is not pregnant as she is not sexually active at this time.  She has no concern for STI.  She is followed by OB/GYN but has not been seen by them recently; has not tried boric acid suppositories for prevention. ? ? ?Past Medical History:  ?Diagnosis Date  ? Bacterial vaginosis   ? Hypertension   ? Hypertension affecting pregnancy, antepartum 10/27/2019  ? Ovarian cyst   ? Pregnancy induced hypertension   ? ? ?Patient Active Problem List  ? Diagnosis Date Noted  ? Abnormal Pap smear of cervix 10/23/2021  ? Generalized anxiety disorder 04/19/2021  ? Essential hypertension 03/03/2020  ? Ovarian cyst, left 03/01/2015  ? Menorrhagia with irregular cycle 03/01/2015  ? Dyspareunia 03/01/2015  ? ? ?Past Surgical History:  ?Procedure Laterality Date  ? DILATION AND EVACUATION N/A 02/21/2013  ? Procedure: DILATATION AND EVACUATION;  Surgeon: Reva Boresanya S Pratt, MD;  Location: [facility];  Service: Gynecology;  Laterality: N/A;  ? ? ?OB History   ? ? Gravida  ?5  ? Para  ?4  ? Term  ?3  ? Preterm  ?1  ? AB  ?   ? Living  ?4  ?  ? ? SAB  ?   ? IAB  ?   ? Ectopic  ?   ? Multiple  ?   ? Live Births  ?4  ?   ?  ?  ? ? ? ?Home Medications   ? ?Prior to Admission medications   ?Medication Sig Start Date End Date Taking? Authorizing Provider  ?clindamycin (CLEOCIN) 300 MG capsule Take 1 capsule (300 mg total) by  mouth in the morning and at bedtime for 7 days. 02/21/22 02/28/22 Yes Gaige Sebo K, PA-C  ?amLODipine (NORVASC) 10 MG tablet Take 1 tablet (10 mg total) by mouth daily. 10/08/21 10/08/22  Eliezer BottomAslam, Sadia, MD  ?hydrochlorothiazide (HYDRODIURIL) 25 MG tablet Take 1 tablet (25 mg total) by mouth daily. 10/08/21   Eliezer BottomAslam, Sadia, MD  ?Lactobacillus (PROBIOTIC ACIDOPHILUS PO) Take 1 tablet by mouth daily.    [provider]  ?Multiple Vitamins-Minerals (WOMENS MULTI PO) Take 1 tablet by mouth daily.    [provider]  ?norethindrone (MICRONOR,CAMILA,ERRIN) 0.35 MG tablet Take 1 tablet (0.35 mg total) by mouth daily. ?Patient not taking: Reported on 08/23/2019 10/07/18 09/29/19  Conan Bowensavis, Kelly M, MD  ? ? ?Family History ?Family History  ?Problem Relation Age of Onset  ? Healthy Father   ? Stroke Mother   ? Hypertension Mother   ? Valvular heart disease Mother   ? ? ?Social History ?Social History  ? ?Tobacco Use  ? Smoking status: Former  ?  Packs/day: 0.25  ?  Types: Cigarettes  ? Smokeless tobacco: Former  ?  Quit date: 01/08/2015  ? Tobacco comments:  ?  quit 3 years ago   ?  Vaping Use  ? Vaping Use: Never used  ?Substance Use Topics  ? Alcohol use: Not Currently  ? Drug use: No  ? ? ? ?Allergies   ?Flagyl [metronidazole] ? ? ?Review of Systems ?Review of Systems  ?Constitutional:  Negative for activity change, appetite change, fatigue and fever.  ?Gastrointestinal:  Negative for abdominal pain, diarrhea, nausea and vomiting.  ?Genitourinary:  Positive for vaginal discharge. Negative for dysuria, frequency, pelvic pain, urgency, vaginal bleeding and vaginal pain.  ?Neurological:  Negative for dizziness, light-headedness and headaches.  ? ? ?Physical Exam ?Triage Vital Signs ?ED Triage Vitals [02/21/22 0949]  ?Enc Vitals Group  ?   BP 127/85  ?   Pulse Rate 81  ?   Resp 16  ?   Temp 98.1 ?F (36.7 ?C)  ?   Temp Source Oral  ?   SpO2 98 %  ?   Weight   ?   Height   ?   Head Circumference   ?   Peak Flow   ?    Pain Score 0  ?   Pain Loc   ?   Pain Edu?   ?   Excl. in GC?   ? ?No data found. ? ?Updated Vital Signs ?BP 127/85 (BP Location: Left Arm)   Pulse 81   Temp 98.1 ?F (36.7 ?C) (Oral)   Resp 16   SpO2 98%  ? ?Visual Acuity ?Right Eye Distance:   ?Left Eye Distance:   ?Bilateral Distance:   ? ?Right Eye Near:   ?Left Eye Near:    ?Bilateral Near:    ? ?Physical Exam ?Vitals reviewed.  ?Constitutional:   ?   General: She is awake. She is not in acute distress. ?   Appearance: Normal appearance. She is well-developed. She is not ill-appearing.  ?   Comments: Very pleasant female appears stated age in no acute distress  ?HENT:  ?   Head: Normocephalic and atraumatic.  ?Cardiovascular:  ?   Rate and Rhythm: Normal rate and regular rhythm.  ?   Heart sounds: Normal heart sounds, S1 normal and S2 normal. No murmur heard. ?Pulmonary:  ?   Effort: Pulmonary effort is normal.  ?   Breath sounds: Normal breath sounds. No wheezing, rhonchi or rales.  ?   Comments: Clear to auscultation bilaterally ?Abdominal:  ?   General: Bowel sounds are normal.  ?   Palpations: Abdomen is soft.  ?   Tenderness: There is no abdominal tenderness. There is no right CVA tenderness, left CVA tenderness, guarding or rebound.  ?   Comments: Benign abdominal exam  ?Genitourinary: ?   Comments: Exam deferred ?Psychiatric:     ?   Behavior: Behavior is cooperative.  ? ? ? ?UC Treatments / Results  ?Labs ?(all labs ordered are listed, but only abnormal results are displayed) ?Labs Reviewed  ?CERVICOVAGINAL ANCILLARY ONLY  ? ? ?EKG ? ? ?Radiology ?No results found. ? ?Procedures ?Procedures (including critical care time) ? ?Medications Ordered in UC ?Medications - No data to display ? ?Initial Impression / Assessment and Plan / UC Course  ?I have reviewed the triage vital signs and the nursing notes. ? ?Pertinent labs & imaging results that were available during my care of the patient were reviewed by me and considered in my medical decision making  (see chart for details). ? ?  ? ?Will empirically treat for bacterial vaginosis given clinical presentation and history of the same with similar symptoms.  Patient is  allergic to metronidazole so we will use clindamycin.  She requested pills over cream as this is previously been more effective.  Clindamycin 300 mg twice daily for 7 days sent to pharmacy.  Recommended use of hypoallergenic soaps and detergents.  STI swab was collected and we will contact her if we need to arrange any additional treatment.  Recommended she follow-up with OB/GYN if symptoms persist and consider boric acid suppositories for prevention and treatment.  Discussed that if anything worsens and she has abdominal pain, pelvic pain, fever, nausea, vomiting she should be seen immediately.  Strict return precautions given to which she expressed understanding. ? ?Final Clinical Impressions(s) / UC Diagnoses  ? ?Final diagnoses:  ?Vaginal odor  ?Vaginal discharge  ? ? ? ?Discharge Instructions   ? ?  ?We are treating for bacterial vaginosis.  Take clindamycin 300 mg twice daily for 7 days.  If we need to arrange any additional treatment we will contact you based on your swab result.  Wear loosefitting cotton underwear and use hypoallergenic soaps and detergents.  If your symptoms recur please follow-up with your OB/GYN for further evaluation and management.  If anything worsens and you have high fever, abdominal pain, nausea/vomiting, pelvic pain you need to be seen immediately. ? ? ? ? ?ED Prescriptions   ? ? Medication Sig Dispense Auth. Provider  ? clindamycin (CLEOCIN) 300 MG capsule Take 1 capsule (300 mg total) by mouth in the morning and at bedtime for 7 days. 14 capsule Nason Conradt K, PA-C  ? ?  ? ?PDMP not reviewed this encounter. ?  ?Jeani Hawking, PA-C ?02/21/22 1008 ? ?

## 2022-02-21 NOTE — ED Triage Notes (Signed)
Pt reports vaginal odor x 2 days. States she has a hx of recurring BV.  ?

## 2022-02-22 ENCOUNTER — Telehealth (HOSPITAL_COMMUNITY): Payer: Self-pay | Admitting: Emergency Medicine

## 2022-02-22 LAB — CERVICOVAGINAL ANCILLARY ONLY
Bacterial Vaginitis (gardnerella): POSITIVE — AB
Candida Glabrata: NEGATIVE
Candida Vaginitis: NEGATIVE
Chlamydia: POSITIVE — AB
Comment: NEGATIVE
Comment: NEGATIVE
Comment: NEGATIVE
Comment: NEGATIVE
Comment: NEGATIVE
Comment: NORMAL
Neisseria Gonorrhea: NEGATIVE
Trichomonas: POSITIVE — AB

## 2022-02-22 MED ORDER — DOXYCYCLINE HYCLATE 100 MG PO CAPS: 100.0000 mg | ORAL_CAPSULE | Freq: Two times a day (BID) | ORAL | 0 refills | Status: AC

## 2022-03-06 ENCOUNTER — Encounter: Payer: Medicaid Other | Admitting: Student

## 2022-03-06 ENCOUNTER — Encounter: Payer: Self-pay | Admitting: Internal Medicine

## 2022-03-06 ENCOUNTER — Other Ambulatory Visit: Payer: Self-pay | Admitting: Student

## 2022-03-06 MED ORDER — TINIDAZOLE 500 MG PO TABS: 500.0000 mg | ORAL_TABLET | Freq: Two times a day (BID) | ORAL | 0 refills | Status: AC

## 2022-03-06 NOTE — Progress Notes (Signed)
Patient missed appointment with clinic today, she thought it was scheduled for tomorrow. We also discussed her recent ED visit where she was found to test positive for trichomonas, chlamydia, and BV. She was sent in clindamycin and doxycycline but I had not seen a treatment for her trichomonas infection. In the past she has had an allergy to flagyl and was sent to allergy specialist for desensitization. During the visit with allergy, they believed her reaction to flagyl to be a non-IgE type reaction and the patient had tolerated tinidazole in the past without difficulties. They recommended this as a prescription then.  ? ?Will write prescription for tinidazole 500 mg BID for 5 days. Patient to follow up in our clinic for repeat testing as well as HIV and urine pregnancy testing. Offered partner treatment but she said he had already been seen and treated.  ?

## 2022-03-16 ENCOUNTER — Ambulatory Visit (HOSPITAL_COMMUNITY): Payer: Medicaid Other

## 2022-03-20 ENCOUNTER — Encounter: Payer: Medicaid Other | Admitting: Internal Medicine

## 2022-03-22 ENCOUNTER — Encounter: Payer: Self-pay | Admitting: Internal Medicine

## 2022-05-08 ENCOUNTER — Ambulatory Visit (HOSPITAL_COMMUNITY): Payer: Medicaid Other

## 2022-05-12 ENCOUNTER — Ambulatory Visit (HOSPITAL_COMMUNITY): Payer: Medicaid Other

## 2022-05-14 ENCOUNTER — Ambulatory Visit (HOSPITAL_COMMUNITY): Payer: Medicaid Other

## 2022-07-18 ENCOUNTER — Ambulatory Visit: Payer: Medicaid Other | Admitting: Allergy

## 2022-07-19 ENCOUNTER — Encounter: Payer: Medicaid Other | Admitting: Student

## 2022-07-25 ENCOUNTER — Encounter: Payer: Medicaid Other | Admitting: Internal Medicine

## 2022-07-25 ENCOUNTER — Encounter: Payer: Self-pay | Admitting: Student

## 2022-09-10 ENCOUNTER — Ambulatory Visit (HOSPITAL_COMMUNITY)
Admission: EM | Admit: 2022-09-10 | Discharge: 2022-09-10 | Disposition: A | Discharge status: Home / Self Care | Payer: Medicaid Other | Attending: Emergency Medicine | Admitting: Emergency Medicine

## 2022-09-10 ENCOUNTER — Encounter (HOSPITAL_COMMUNITY): Payer: Self-pay | Admitting: Emergency Medicine

## 2022-09-10 DIAGNOSIS — H0011 Chalazion right upper eyelid: Secondary | ICD-10-CM

## 2022-09-10 DIAGNOSIS — I10 Essential (primary) hypertension: Secondary | ICD-10-CM

## 2022-09-10 MED ORDER — AMLODIPINE BESYLATE 10 MG PO TABS: 10.0000 mg | ORAL_TABLET | Freq: Every day | ORAL | 1 refills | Status: DC

## 2022-09-10 MED ORDER — ERYTHROMYCIN 5 MG/GM OP OINT: TOPICAL_OINTMENT | Freq: Three times a day (TID) | OPHTHALMIC | 0 refills | Status: AC

## 2022-09-10 MED ORDER — HYDROCHLOROTHIAZIDE 25 MG PO TABS: 25.0000 mg | ORAL_TABLET | Freq: Every day | ORAL | 1 refills | Status: DC

## 2022-09-10 NOTE — Discharge Instructions (Signed)
Use the ointment 3 times daily in the right eye.  Apply warm compress to the eyelid multiple times daily, and gently massage the eyelid. Please return if symptoms persist.  I have refilled your blood pressure medications.  Please follow-up with your primary care as soon as you are able. Please go to the emergency department if symptoms worsen.

## 2022-09-10 NOTE — ED Provider Notes (Signed)
[facility]    CSN: [id number] Arrival date & time: 09/10/22  1220      History   Chief Complaint Chief Complaint  Patient presents with   Eye Pain    HPI Angela Manning is a 36 y.o. female.  Presents with right eyelid swelling and pain for 2 days Reports she feels a bump in the eyelid, irritated Denies any vision changes or loss of vision.  No drainage.  No foreign body sensation.  Does not wear contacts Tried warm compress a few times  Additionally out of her blood pressure medication for almost 2 months.  Denies any current symptoms Has PCP appointment coming up  Past Medical History:  Diagnosis Date   Bacterial vaginosis    Hypertension    Hypertension affecting pregnancy, antepartum 10/27/2019   Ovarian cyst    Pregnancy induced hypertension     Patient Active Problem List   Diagnosis Date Noted   Abnormal Pap smear of cervix 10/23/2021   Generalized anxiety disorder 04/19/2021   Essential hypertension 03/03/2020   Ovarian cyst, left 03/01/2015   Menorrhagia with irregular cycle 03/01/2015   Dyspareunia 03/01/2015    Past Surgical History:  Procedure Laterality Date   DILATION AND EVACUATION N/A 02/21/2013   Procedure: DILATATION AND EVACUATION;  Surgeon: Reva Bores, MD;  Location: [facility];  Service: Gynecology;  Laterality: N/A;    OB History     Gravida  5   Para  4   Term  3   Preterm  1   AB      Living  4      SAB      IAB      Ectopic      Multiple      Live Births  4            Home Medications    Prior to Admission medications   Medication Sig Start Date End Date Taking? Authorizing Provider  erythromycin ophthalmic ointment Place into the right eye 3 (three) times daily for 5 days. Place a 1/2 inch ribbon of ointment into the lower eyelid. 09/10/22 09/15/22 Yes Zackory Pudlo, Lurena Joiner, PA-C  amLODipine (NORVASC) 10 MG tablet Take 1 tablet (10 mg total) by mouth daily. 09/10/22   Janylah Belgrave, Lurena Joiner, PA-C   hydrochlorothiazide (HYDRODIURIL) 25 MG tablet Take 1 tablet (25 mg total) by mouth daily. 09/10/22   Sherlon Nied, PA-C  Lactobacillus (PROBIOTIC ACIDOPHILUS PO) Take 1 tablet by mouth daily.    [provider]  Multiple Vitamins-Minerals (WOMENS MULTI PO) Take 1 tablet by mouth daily.    [provider]  norethindrone (MICRONOR,CAMILA,ERRIN) 0.35 MG tablet Take 1 tablet (0.35 mg total) by mouth daily. Patient not taking: Reported on 08/23/2019 10/07/18 09/29/19  Conan Bowens, MD    Family History Family History  Problem Relation Age of Onset   Healthy Father    Stroke Mother    Hypertension Mother    Valvular heart disease Mother     Social History Social History   Tobacco Use   Smoking status: Former    Packs/day: 0.25    Types: Cigarettes   Smokeless tobacco: Former    Quit date: 01/08/2015   Tobacco comments:    quit 3 years ago   Vaping Use   Vaping Use: Never used  Substance Use Topics   Alcohol use: Not Currently   Drug use: No     Allergies   Flagyl [metronidazole]   Review of Systems  Review of Systems  Eyes:  Positive for pain.  Per HPI   Physical Exam Triage Vital Signs ED Triage Vitals  Enc Vitals Group     BP 09/10/22 1253 (!) 171/121     Pulse Rate 09/10/22 1253 88     Resp 09/10/22 1253 16     Temp 09/10/22 1253 97.9 F (36.6 C)     Temp Source 09/10/22 1253 Oral     SpO2 09/10/22 1253 98 %     Weight --      Height --      Head Circumference --      Peak Flow --      Pain Score 09/10/22 1252 5     Pain Loc --      Pain Edu? --      Excl. in GC? --    No data found.  Updated Vital Signs BP (!) 169/125 (BP Location: Right Arm)   Pulse 88   Temp 97.9 F (36.6 C) (Oral)   Resp 16   LMP 09/05/2022   SpO2 98%   Visual Acuity Right Eye Distance: 20/40 Left Eye Distance: 20/25 Bilateral Distance: 20/20  Right Eye Near:   Left Eye Near:    Bilateral Near:     Physical Exam Vitals and nursing note  reviewed.  Constitutional:      Appearance: Normal appearance.  HENT:     Mouth/Throat:     Mouth: Mucous membranes are moist.     Pharynx: Oropharynx is clear. No posterior oropharyngeal erythema.  Eyes:     General: Vision grossly intact. No scleral icterus.       Right eye: No discharge.     Extraocular Movements: Extraocular movements intact.     Conjunctiva/sclera: Conjunctivae normal.     Right eye: Right conjunctiva is not injected. No chemosis.    Pupils: Pupils are equal, round, and reactive to light.     Comments: Right upper lid mildly swollen. Lid everted, small chalazion noted medially  Cardiovascular:     Rate and Rhythm: Normal rate and regular rhythm.     Pulses: Normal pulses.     Heart sounds: Normal heart sounds.  Pulmonary:     Effort: Pulmonary effort is normal.     Breath sounds: Normal breath sounds.  Musculoskeletal:     Cervical back: Normal range of motion.  Lymphadenopathy:     Cervical: No cervical adenopathy.  Skin:    General: Skin is warm and dry.  Neurological:     General: No focal deficit present.     Mental Status: She is alert and oriented to person, place, and time.     Cranial Nerves: No cranial nerve deficit.     Sensory: Sensation is intact.     Motor: Motor function is intact. No weakness.     Coordination: Coordination is intact.     Gait: Gait is intact.     Comments: Strength 5/5 extrem      UC Treatments / Results  Labs (all labs ordered are listed, but only abnormal results are displayed) Labs Reviewed - No data to display  EKG  Radiology No results found.  Procedures Procedures   Medications Ordered in UC Medications - No data to display  Initial Impression / Assessment and Plan / UC Course  I have reviewed the triage vital signs and the nursing notes.  Pertinent labs & imaging results that were available during my care of the patient were reviewed by me and  considered in my medical decision making (see chart  for details).  Visual acuity intact. Well appearing. Neuro exam intact.  Likely chalazion Discussed use of erythromycin ointment, warm compress, gentle massage Refilled blood pressure medication.  Still elevated on recheck, patient denies any red flag symptoms.  Discussed follow-up with PCP and taking medication daily. Return precautions discussed. Patient agrees to plan  Final Clinical Impressions(s) / UC Diagnoses   Final diagnoses:  Chalazion of right upper eyelid  Hypertension, unspecified type     Discharge Instructions      Use the ointment 3 times daily in the right eye.  Apply warm compress to the eyelid multiple times daily, and gently massage the eyelid. Please return if symptoms persist.  I have refilled your blood pressure medications.  Please follow-up with your primary care as soon as you are able. Please go to the emergency department if symptoms worsen.    ED Prescriptions     Medication Sig Dispense Auth. Provider   amLODipine (NORVASC) 10 MG tablet Take 1 tablet (10 mg total) by mouth daily. 60 tablet Ashtan Girtman, PA-C   hydrochlorothiazide (HYDRODIURIL) 25 MG tablet Take 1 tablet (25 mg total) by mouth daily. 60 tablet Toddrick Sanna, PA-C   erythromycin ophthalmic ointment Place into the right eye 3 (three) times daily for 5 days. Place a 1/2 inch ribbon of ointment into the lower eyelid. 3.5 g Karmello Abercrombie, Wells Guiles, PA-C      PDMP not reviewed this encounter.   Margerie Fraiser, Wells Guiles, PA-C 09/10/22 1400

## 2022-09-10 NOTE — ED Triage Notes (Signed)
Pt requesting refill of her HTN medications as she has been out a month and unable to get with PCP

## 2022-09-10 NOTE — ED Triage Notes (Signed)
Pt reports right eye pain, swelling and blurred vision for 2 days. Denies drainage. Denies getting anything in eye or injury.

## 2022-10-29 ENCOUNTER — Ambulatory Visit (HOSPITAL_COMMUNITY): Payer: Medicaid Other

## 2022-11-11 ENCOUNTER — Encounter: Payer: Self-pay | Admitting: Student

## 2022-11-11 ENCOUNTER — Encounter: Payer: Medicaid Other | Admitting: Student

## 2022-12-13 ENCOUNTER — Encounter (HOSPITAL_COMMUNITY): Payer: Self-pay

## 2022-12-13 ENCOUNTER — Ambulatory Visit (HOSPITAL_COMMUNITY)
Admission: RE | Admit: 2022-12-13 | Discharge: 2022-12-13 | Disposition: A | Discharge status: Home / Self Care | Payer: Medicaid Other | Source: Ambulatory Visit | Attending: Internal Medicine | Admitting: Internal Medicine

## 2022-12-13 VITALS — BP 167/123 | HR 73 | Temp 98.3°F | Resp 18

## 2022-12-13 DIAGNOSIS — I10 Essential (primary) hypertension: Secondary | ICD-10-CM | POA: Diagnosis not present

## 2022-12-13 DIAGNOSIS — N898 Other specified noninflammatory disorders of vagina: Secondary | ICD-10-CM | POA: Diagnosis not present

## 2022-12-13 DIAGNOSIS — Z79899 Other long term (current) drug therapy: Secondary | ICD-10-CM | POA: Diagnosis not present

## 2022-12-13 MED ORDER — CLINDAMYCIN HCL 300 MG PO CAPS: 300.0000 mg | ORAL_CAPSULE | Freq: Two times a day (BID) | ORAL | 0 refills | Status: AC

## 2022-12-13 MED ORDER — HYDROCHLOROTHIAZIDE 12.5 MG PO TABS: 12.5000 mg | ORAL_TABLET | Freq: Every day | ORAL | 0 refills | Status: DC

## 2022-12-13 MED ORDER — AMLODIPINE BESYLATE 10 MG PO TABS: 10.0000 mg | ORAL_TABLET | Freq: Every day | ORAL | 0 refills | Status: DC

## 2022-12-13 NOTE — ED Triage Notes (Signed)
Pt states she has BV she has had it in the past, she started noticing a smell and discharge yesterday.   She stopped her BP meds, she thought it was better. She does complain of her head throbbing when she is loud.

## 2022-12-13 NOTE — Discharge Instructions (Signed)
Take clindamycin 2 times a day with food for the next 7 days to treat suspected BV.   The rest of your STD testing is pending and will come back in the next 2 to 3 days.  Avoid intercourse for the next 2 to 3 days until your results come back, if you test positive for any other STDs you will need to abstain from intercourse for 7 days while being treated so as to avoid spread to partner.  Your blood pressure is very elevated in the clinic.  Start taking amlodipine 10 mg and hydrochlorothiazide 12.5 mg daily. Please call your primary care provider to schedule an appointment for further evaluation and refills of these medications as it is very important that you take these every day. Attempt to eat a low-salt diet and increase your exercise.

## 2022-12-16 LAB — CERVICOVAGINAL ANCILLARY ONLY
Bacterial Vaginitis (gardnerella): POSITIVE — AB
Candida Glabrata: NEGATIVE
Candida Vaginitis: POSITIVE — AB
Chlamydia: NEGATIVE
Comment: NEGATIVE
Comment: NEGATIVE
Comment: NEGATIVE
Comment: NEGATIVE
Comment: NEGATIVE
Comment: NORMAL
Neisseria Gonorrhea: POSITIVE — AB
Trichomonas: NEGATIVE

## 2022-12-17 ENCOUNTER — Telehealth (HOSPITAL_COMMUNITY): Payer: Self-pay | Admitting: Emergency Medicine

## 2022-12-17 MED ORDER — FLUCONAZOLE 150 MG PO TABS: 150.0000 mg | ORAL_TABLET | Freq: Once | ORAL | 0 refills | Status: AC

## 2022-12-17 NOTE — Telephone Encounter (Signed)
Patient went home on Clindamycin. Per protocol, patient will need treatment with IM Rocephin 500mg  for positive Gonorrhea and Diflucan.   Results seen on mychart Prescription sent to pharmacy on file

## 2022-12-18 ENCOUNTER — Ambulatory Visit (HOSPITAL_COMMUNITY)
Admission: RE | Admit: 2022-12-18 | Discharge: 2022-12-18 | Disposition: A | Discharge status: Home / Self Care | Payer: Medicaid Other | Source: Ambulatory Visit | Attending: Internal Medicine | Admitting: Internal Medicine

## 2022-12-18 DIAGNOSIS — A549 Gonococcal infection, unspecified: Secondary | ICD-10-CM | POA: Diagnosis not present

## 2022-12-18 MED ORDER — CEFTRIAXONE SODIUM 1 G IJ SOLR
500.0000 mg | Freq: Once | INTRAMUSCULAR | Status: AC
  Administered 2022-12-18: 500 mg via INTRAMUSCULAR

## 2022-12-18 MED ORDER — CEFTRIAXONE SODIUM 500 MG IJ SOLR
INTRAMUSCULAR | Status: AC
  Filled 2022-12-18: qty 500

## 2022-12-18 MED ORDER — LIDOCAINE HCL (PF) 1 % IJ SOLN
INTRAMUSCULAR | Status: AC
  Filled 2022-12-18: qty 2

## 2022-12-18 NOTE — ED Notes (Signed)
Patient given rocephin 500 in the right upper outer quadrant. Tolerated well.

## 2022-12-18 NOTE — ED Provider Notes (Signed)
[facility]    CSN: [id number] Arrival date & time: 12/13/22  1205      History   Chief Complaint Chief Complaint  Patient presents with   SEXUALLY TRANSMITTED DISEASE    HPI Angela Manning is a 37 y.o. female.   Patient presents to urgent care for evaluation of vaginal discharge that she describes as thin, white, and malodorous that started yesterday. Denies vaginal itching, rash, and dyspareunia. States symptoms feel similar to the last time she had BV in June 2023. Reports recent new female sexual partner without condom use. Unknown exposure to STD. Denies urinary symptoms and chance of pregnancy. LMP was 11/26/2022.   BP is noticeably elevated today at 167/123. She reports generalized headache and slight phonophobia for the last few days. She has not been taking her BP medication for a few months since she "thought she was better and didn't need it".  History of pregnancy induced hypertension. She used to take amlodipine 10mg  and hydrochlorothiazide 12.5mg . Denies history of kidney problems. Reports significant family history of hypertension. Denies vision changes, dizziness, severe headache, nausea, vomiting, recent head injuries, fever/chills, neck pain, and chest pain/shortness of breath. Denies heart palpitations. Would like to get started back on BP medications today. States she does have a PCP and plans to follow-up with them in the next few weeks.      Past Medical History:  Diagnosis Date   Bacterial vaginosis    Hypertension    Hypertension affecting pregnancy, antepartum 10/27/2019   Ovarian cyst    Pregnancy induced hypertension     Patient Active Problem List   Diagnosis Date Noted   Abnormal Pap smear of cervix 10/23/2021   Generalized anxiety disorder 04/19/2021   Essential hypertension 03/03/2020   Ovarian cyst, left 03/01/2015   Menorrhagia with irregular cycle 03/01/2015   Dyspareunia 03/01/2015    Past Surgical History:  Procedure  Laterality Date   DILATION AND EVACUATION N/A 02/21/2013   Procedure: DILATATION AND EVACUATION;  Surgeon: Donnamae Jude, MD;  Location: [facility];  Service: Gynecology;  Laterality: N/A;    OB History     Gravida  5   Para  4   Term  3   Preterm  1   AB      Living  4      SAB      IAB      Ectopic      Multiple      Live Births  4            Home Medications    Prior to Admission medications   Medication Sig Start Date End Date Taking? Authorizing Provider  clindamycin (CLEOCIN) 300 MG capsule Take 1 capsule (300 mg total) by mouth in the morning and at bedtime for 7 days. 12/13/22 12/20/22 Yes Talbot Grumbling, FNP  amLODipine (NORVASC) 10 MG tablet Take 1 tablet (10 mg total) by mouth daily. 12/13/22   Talbot Grumbling, FNP  hydrochlorothiazide (HYDRODIURIL) 12.5 MG tablet Take 1 tablet (12.5 mg total) by mouth daily. 12/13/22   Talbot Grumbling, FNP  Lactobacillus (PROBIOTIC ACIDOPHILUS PO) Take 1 tablet by mouth daily.    [provider]  Multiple Vitamins-Minerals (WOMENS MULTI PO) Take 1 tablet by mouth daily.    [provider]  norethindrone (MICRONOR,CAMILA,ERRIN) 0.35 MG tablet Take 1 tablet (0.35 mg total) by mouth daily. Patient not taking: Reported on 08/23/2019 10/07/18 09/29/19  Sloan Leiter, MD  Family History Family History  Problem Relation Age of Onset   Healthy Father    Stroke Mother    Hypertension Mother    Valvular heart disease Mother     Social History Social History   Tobacco Use   Smoking status: Former    Packs/day: 0.25    Types: Cigarettes   Smokeless tobacco: Former    Quit date: 01/08/2015   Tobacco comments:    quit 3 years ago   Vaping Use   Vaping Use: Never used  Substance Use Topics   Alcohol use: Not Currently   Drug use: No     Allergies   Flagyl [metronidazole]   Review of Systems Review of Systems Per HPI  Physical Exam Triage Vital Signs ED Triage Vitals  Enc  Vitals Group     BP 12/13/22 1230 (!) 167/123     Pulse Rate 12/13/22 1230 73     Resp 12/13/22 1230 18     Temp 12/13/22 1230 98.3 F (36.8 C)     Temp Source 12/13/22 1230 Oral     SpO2 12/13/22 1230 99 %     Weight --      Height --      Head Circumference --      Peak Flow --      Pain Score 12/13/22 1229 0     Pain Loc --      Pain Edu? --      Excl. in [place]? --    No data found.  Updated Vital Signs BP (!) 167/123 (BP Location: Left Arm)   Pulse 73   Temp 98.3 F (36.8 C) (Oral)   Resp 18   LMP 11/26/2022 (Exact Date)   SpO2 99%   Visual Acuity Right Eye Distance:   Left Eye Distance:   Bilateral Distance:    Right Eye Near:   Left Eye Near:    Bilateral Near:     Physical Exam Vitals and nursing note reviewed.  Constitutional:      Appearance: She is not ill-appearing or toxic-appearing.  HENT:     Head: Normocephalic and atraumatic.     Right Ear: Hearing, tympanic membrane, ear canal and external ear normal.     Left Ear: Hearing, tympanic membrane, ear canal and external ear normal.     Nose: Nose normal.     Mouth/Throat:     Lips: Pink.     Mouth: Mucous membranes are moist.     Pharynx: No posterior oropharyngeal erythema.  Eyes:     General: Lids are normal. Vision grossly intact. Gaze aligned appropriately.     Extraocular Movements: Extraocular movements intact.     Conjunctiva/sclera: Conjunctivae normal.  Cardiovascular:     Rate and Rhythm: Normal rate and regular rhythm.     Heart sounds: Normal heart sounds, S1 normal and S2 normal.  Pulmonary:     Effort: Pulmonary effort is normal. No respiratory distress.     Breath sounds: Normal breath sounds and air entry.  Genitourinary:    Comments: Deferred. Musculoskeletal:     Cervical back: Neck supple.  Skin:    General: Skin is warm and dry.     Capillary Refill: Capillary refill takes less than 2 seconds.     Findings: No rash.  Neurological:     General: No focal deficit present.      Mental Status: She is alert and oriented to person, place, and time. Mental status is at baseline.  Cranial Nerves: No cranial nerve deficit, dysarthria or facial asymmetry.     Motor: No weakness.     Coordination: Coordination normal.     Gait: Gait normal.  Psychiatric:        Mood and Affect: Mood normal.        Speech: Speech normal.        Behavior: Behavior normal.        Thought Content: Thought content normal.        Judgment: Judgment normal.      UC Treatments / Results  Labs (all labs ordered are listed, but only abnormal results are displayed) Labs Reviewed  CERVICOVAGINAL ANCILLARY ONLY - Abnormal; Notable for the following components:      Result Value   Neisseria Gonorrhea Positive (*)    Bacterial Vaginitis (gardnerella) Positive (*)    Candida Vaginitis Positive (*)    All other components within normal limits    EKG   Radiology No results found.  Procedures Procedures (including critical care time)  Medications Ordered in UC Medications - No data to display  Initial Impression / Assessment and Plan / UC Course  I have reviewed the triage vital signs and the nursing notes.  Pertinent labs & imaging results that were available during my care of the patient were reviewed by me and considered in my medical decision making (see chart for details).   1. Vaginal discharge STI labs pending.  Patient declines HIV and syphilis testing today.  Will notify patient of positive results and treat accordingly when labs come back. Will go ahead and treat likely BV with clindamycin as patient is allergic to flagyl.  Patient to avoid sexual intercourse until screening testing comes back.  Education provided regarding safe sexual practices and patient encouraged to use protection to prevent spread of STIs.    2. Essential hypertension, medication management No signs of end-organ damage related to elevated BP, neuro exam is stable and without focal deficit.  Cardiopulmonary exam stable as well. Amlodipine 10mg  and HCTZ 12.5mg  sent to pharmacy. Encouraged patient to follow-up with PCP in the next 1-2 weeks for further evaluation and management of HTN. Discussed reduced salt intake to less than 1g/day and DASH diet. Increased exercise recommended to further reduce BP. Strict ED and urgent care return precautions discussed.  Discussed physical exam and available lab work findings in clinic with patient.  Counseled patient regarding appropriate use of medications and potential side effects for all medications recommended or prescribed today. Discussed red flag signs and symptoms of worsening condition,when to call the PCP office, return to urgent care, and when to seek higher level of care in the emergency department. Patient verbalizes understanding and agreement with plan. All questions answered. Patient discharged in stable condition.     Final Clinical Impressions(s) / UC Diagnoses   Final diagnoses:  Essential hypertension  Medication management  Vaginal discharge     Discharge Instructions      Take clindamycin 2 times a day with food for the next 7 days to treat suspected BV.   The rest of your STD testing is pending and will come back in the next 2 to 3 days.  Avoid intercourse for the next 2 to 3 days until your results come back, if you test positive for any other STDs you will need to abstain from intercourse for 7 days while being treated so as to avoid spread to partner.  Your blood pressure is very elevated in the clinic.  Start  taking amlodipine 10 mg and hydrochlorothiazide 12.5 mg daily. Please call your primary care provider to schedule an appointment for further evaluation and refills of these medications as it is very important that you take these every day. Attempt to eat a low-salt diet and increase your exercise.    ED Prescriptions     Medication Sig Dispense Auth. Provider   amLODipine (NORVASC) 10 MG tablet Take 1  tablet (10 mg total) by mouth daily. 30 tablet Reita May M, FNP   hydrochlorothiazide (HYDRODIURIL) 12.5 MG tablet Take 1 tablet (12.5 mg total) by mouth daily. 30 tablet Reita May M, FNP   clindamycin (CLEOCIN) 300 MG capsule Take 1 capsule (300 mg total) by mouth in the morning and at bedtime for 7 days. 14 capsule Carlisle Beers, FNP      PDMP not reviewed this encounter.   Carlisle Beers, [place] 12/18/22 1119

## 2022-12-18 NOTE — ED Triage Notes (Signed)
Patient presenting for treatment of gonorrhea via rocephin 500 mg injection.

## 2023-01-08 ENCOUNTER — Encounter (HOSPITAL_COMMUNITY): Payer: Self-pay

## 2023-01-08 ENCOUNTER — Ambulatory Visit (HOSPITAL_COMMUNITY)
Admission: RE | Admit: 2023-01-08 | Discharge: 2023-01-08 | Disposition: A | Discharge status: Home / Self Care | Payer: Medicaid Other | Source: Ambulatory Visit | Attending: Physician Assistant | Admitting: Physician Assistant

## 2023-01-08 VITALS — BP 132/91 | HR 79 | Temp 98.6°F | Resp 16

## 2023-01-08 DIAGNOSIS — N898 Other specified noninflammatory disorders of vagina: Secondary | ICD-10-CM | POA: Diagnosis not present

## 2023-01-08 DIAGNOSIS — Z113 Encounter for screening for infections with a predominantly sexual mode of transmission: Secondary | ICD-10-CM | POA: Insufficient documentation

## 2023-01-08 LAB — POC URINE PREG, ED: Preg Test, Ur: NEGATIVE

## 2023-01-08 MED ORDER — CLINDAMYCIN HCL 300 MG PO CAPS: 300.0000 mg | ORAL_CAPSULE | Freq: Two times a day (BID) | ORAL | 0 refills | Status: DC

## 2023-01-08 NOTE — ED Triage Notes (Signed)
Pt states she is having a clear vaginal discharge for the past 2 days.

## 2023-01-08 NOTE — ED Provider Notes (Signed)
[facility]    CSN: [id number] Arrival date & time: 01/08/23  1413      History   Chief Complaint Chief Complaint  Patient presents with   Vaginal Discharge    HPI Angela Manning is a 37 y.o. female.   Patient presents today with a 2-day history of recurrent vaginal discharge which she describes as clear, copious, malodorous.  She denies any specific concern for STI but is interested in obtaining STI swab today.  She declined HIV and syphilis testing.  She was seen 12/13/2022 at which point she tested positive for BV, yeast, gonorrhea.  She has completed treatment.  She does have an OB/GYN but has not seen them recently.  She has never been on suppressive therapy such as boric acid suppositories for BV.  Reports that she changed her soap just prior to symptoms beginning.  Denies any recent antibiotics outside of what was prescribed on her 12/13/2022 visit.  She denies any abdominal pain, pelvic pain, fever, nausea, vomiting.  Does not believe that she could be pregnant but is open to testing.    Past Medical History:  Diagnosis Date   Bacterial vaginosis    Hypertension    Hypertension affecting pregnancy, antepartum 10/27/2019   Ovarian cyst    Pregnancy induced hypertension     Patient Active Problem List   Diagnosis Date Noted   Abnormal Pap smear of cervix 10/23/2021   Generalized anxiety disorder 04/19/2021   Essential hypertension 03/03/2020   Ovarian cyst, left 03/01/2015   Menorrhagia with irregular cycle 03/01/2015   Dyspareunia 03/01/2015    Past Surgical History:  Procedure Laterality Date   DILATION AND EVACUATION N/A 02/21/2013   Procedure: DILATATION AND EVACUATION;  Surgeon: Donnamae Jude, MD;  Location: [facility];  Service: Gynecology;  Laterality: N/A;    OB History     Gravida  5   Para  4   Term  3   Preterm  1   AB      Living  4      SAB      IAB      Ectopic      Multiple      Live Births  4            Home  Medications    Prior to Admission medications   Medication Sig Start Date End Date Taking? Authorizing Provider  clindamycin (CLEOCIN) 300 MG capsule Take 1 capsule (300 mg total) by mouth 2 (two) times daily. 01/08/23  Yes Rafael Salway K, PA-C  amLODipine (NORVASC) 10 MG tablet Take 1 tablet (10 mg total) by mouth daily. 12/13/22   Talbot Grumbling, FNP  hydrochlorothiazide (HYDRODIURIL) 12.5 MG tablet Take 1 tablet (12.5 mg total) by mouth daily. 12/13/22   Talbot Grumbling, FNP  Lactobacillus (PROBIOTIC ACIDOPHILUS PO) Take 1 tablet by mouth daily.    [provider]  Multiple Vitamins-Minerals (WOMENS MULTI PO) Take 1 tablet by mouth daily.    [provider]  norethindrone (MICRONOR,CAMILA,ERRIN) 0.35 MG tablet Take 1 tablet (0.35 mg total) by mouth daily. Patient not taking: Reported on 08/23/2019 10/07/18 09/29/19  Sloan Leiter, MD    Family History Family History  Problem Relation Age of Onset   Healthy Father    Stroke Mother    Hypertension Mother    Valvular heart disease Mother     Social History Social History   Tobacco Use   Smoking status: Former  Packs/day: 0.25    Types: Cigarettes   Smokeless tobacco: Former    Quit date: 01/08/2015   Tobacco comments:    quit 3 years ago   Vaping Use   Vaping Use: Never used  Substance Use Topics   Alcohol use: Not Currently   Drug use: No     Allergies   Flagyl [metronidazole]   Review of Systems Review of Systems  Constitutional:  Negative for activity change, appetite change, fatigue and fever.  Gastrointestinal:  Negative for abdominal pain, diarrhea, nausea and vomiting.  Genitourinary:  Positive for vaginal discharge. Negative for dysuria, frequency, pelvic pain, urgency, vaginal bleeding and vaginal pain.     Physical Exam Triage Vital Signs ED Triage Vitals  Enc Vitals Group     BP 01/08/23 1434 (!) 132/91     Pulse Rate 01/08/23 1434 79     Resp 01/08/23 1434 16     Temp  01/08/23 1434 98.6 F (37 C)     Temp Source 01/08/23 1434 Oral     SpO2 01/08/23 1434 98 %     Weight --      Height --      Head Circumference --      Peak Flow --      Pain Score 01/08/23 1435 0     Pain Loc --      Pain Edu? --      Excl. in [place]? --    No data found.  Updated Vital Signs BP (!) 132/91 (BP Location: Right Arm)   Pulse 79   Temp 98.6 F (37 C) (Oral)   Resp 16   LMP 12/21/2022 (Exact Date)   SpO2 98%   Visual Acuity Right Eye Distance:   Left Eye Distance:   Bilateral Distance:    Right Eye Near:   Left Eye Near:    Bilateral Near:     Physical Exam Vitals reviewed.  Constitutional:      General: She is awake. She is not in acute distress.    Appearance: Normal appearance. She is well-developed. She is not ill-appearing.     Comments: Very pleasant female appears stated age in no acute distress sitting comfortably in exam room  HENT:     Head: Normocephalic and atraumatic.  Cardiovascular:     Rate and Rhythm: Normal rate and regular rhythm.     Heart sounds: Normal heart sounds, S1 normal and S2 normal. No murmur heard. Pulmonary:     Effort: Pulmonary effort is normal.     Breath sounds: Normal breath sounds. No wheezing, rhonchi or rales.     Comments: Clear to auscultation bilaterally Abdominal:     General: Bowel sounds are normal.     Palpations: Abdomen is soft.     Tenderness: There is no abdominal tenderness. There is no right CVA tenderness, left CVA tenderness, guarding or rebound.     Comments: Benign abdominal exam  Genitourinary:    Comments: Exam deferred Psychiatric:        Behavior: Behavior is cooperative.      UC Treatments / Results  Labs (all labs ordered are listed, but only abnormal results are displayed) Labs Reviewed  POC URINE PREG, ED  CERVICOVAGINAL ANCILLARY ONLY    EKG   Radiology No results found.  Procedures Procedures (including critical care time)  Medications Ordered in UC Medications -  No data to display  Initial Impression / Assessment and Plan / UC Course  I have reviewed the  triage vital signs and the nursing notes.  Pertinent labs & imaging results that were available during my care of the patient were reviewed by me and considered in my medical decision making (see chart for details).     Patient is well-appearing, afebrile, nontoxic, nontachycardic.  She did not believe that she was pregnant but requested testing and this was obtained in clinic today.  Urine pregnancy was negative.  STI swab was collected and is pending.  We did discuss that typically we do not retest until it has been 4 weeks but given she is currently having symptoms we will send off another STI swab.  She declined HIV and syphilis testing.  Will empirically treat for bacterial vaginosis given clinical presentation with clindamycin twice daily for 7 days due to history of allergy to Flagyl.  Recommend that she follow-up with OB/GYN as soon as possible she may benefit from treatment of chronic BV with pelvic acid suppositories with is not typically something that we initiate in urgent care.  Discussed that if her symptoms or not improving she should return for reevaluation.  If she has any worsening symptoms including abdominal pain, pelvic pain, fever, nausea, vomiting she needs to be seen immediately.  Strict return precautions given.  Final Clinical Impressions(s) / UC Diagnoses   Final diagnoses:  Vaginal discharge  Routine screening for STI (sexually transmitted infection)     Discharge Instructions      We are treating you for bacterial vaginosis.  Please take clindamycin twice daily for 7 days.  We will contact you if your swab indicates that we need to treat you for anything else.  Wear loosefitting cotton underwear and use hypoallergenic soaps and detergents.  If your symptoms are not improving please follow-up with your OB/GYN.  If you have any abdominal pain, pelvic pain, fever, nausea,  vomiting you need to be seen immediately.     ED Prescriptions     Medication Sig Dispense Auth. Provider   clindamycin (CLEOCIN) 300 MG capsule Take 1 capsule (300 mg total) by mouth 2 (two) times daily. 14 capsule Stevin Bielinski K, PA-C      PDMP not reviewed this encounter.   Terrilee Croak, PA-C 01/08/23 1308

## 2023-01-08 NOTE — Discharge Instructions (Signed)
We are treating you for bacterial vaginosis.  Please take clindamycin twice daily for 7 days.  We will contact you if your swab indicates that we need to treat you for anything else.  Wear loosefitting cotton underwear and use hypoallergenic soaps and detergents.  If your symptoms are not improving please follow-up with your OB/GYN.  If you have any abdominal pain, pelvic pain, fever, nausea, vomiting you need to be seen immediately.

## 2023-01-15 LAB — CERVICOVAGINAL ANCILLARY ONLY
Bacterial Vaginitis (gardnerella): POSITIVE — AB
Candida Glabrata: NEGATIVE
Candida Vaginitis: NEGATIVE
Chlamydia: NEGATIVE
Comment: NEGATIVE
Comment: NEGATIVE
Comment: NEGATIVE
Comment: NEGATIVE
Comment: NEGATIVE
Comment: NORMAL
Neisseria Gonorrhea: NEGATIVE
Trichomonas: NEGATIVE

## 2023-01-28 ENCOUNTER — Other Ambulatory Visit: Payer: Self-pay

## 2023-01-28 ENCOUNTER — Ambulatory Visit (HOSPITAL_COMMUNITY)
Admission: EM | Admit: 2023-01-28 | Discharge: 2023-01-28 | Discharge status: Against Medical Advice | Payer: Medicaid Other

## 2023-01-28 ENCOUNTER — Ambulatory Visit
Admission: EM | Admit: 2023-01-28 | Discharge: 2023-01-28 | Disposition: A | Discharge status: Home / Self Care | Payer: Medicaid Other | Attending: Student | Admitting: Student

## 2023-01-28 ENCOUNTER — Encounter (HOSPITAL_COMMUNITY): Payer: Self-pay | Admitting: Emergency Medicine

## 2023-01-28 ENCOUNTER — Emergency Department (HOSPITAL_COMMUNITY)
Admission: EM | Admit: 2023-01-28 | Discharge: 2023-01-28 | Disposition: A | Discharge status: Home / Self Care | Payer: Medicaid Other | Attending: Emergency Medicine | Admitting: Emergency Medicine

## 2023-01-28 ENCOUNTER — Emergency Department (HOSPITAL_COMMUNITY): Payer: Medicaid Other

## 2023-01-28 VITALS — BP 142/88 | HR 90 | Temp 100.5°F | Resp 18

## 2023-01-28 DIAGNOSIS — N739 Female pelvic inflammatory disease, unspecified: Secondary | ICD-10-CM | POA: Insufficient documentation

## 2023-01-28 DIAGNOSIS — N7093 Salpingitis and oophoritis, unspecified: Secondary | ICD-10-CM

## 2023-01-28 DIAGNOSIS — R509 Fever, unspecified: Secondary | ICD-10-CM | POA: Diagnosis not present

## 2023-01-28 DIAGNOSIS — I10 Essential (primary) hypertension: Secondary | ICD-10-CM | POA: Diagnosis not present

## 2023-01-28 DIAGNOSIS — R1030 Lower abdominal pain, unspecified: Secondary | ICD-10-CM

## 2023-01-28 DIAGNOSIS — K5900 Constipation, unspecified: Secondary | ICD-10-CM | POA: Diagnosis not present

## 2023-01-28 DIAGNOSIS — N730 Acute parametritis and pelvic cellulitis: Secondary | ICD-10-CM

## 2023-01-28 DIAGNOSIS — R1032 Left lower quadrant pain: Secondary | ICD-10-CM | POA: Diagnosis not present

## 2023-01-28 LAB — LIPASE, BLOOD: Lipase: 34 U/L (ref 11–51)

## 2023-01-28 LAB — URINALYSIS, ROUTINE W REFLEX MICROSCOPIC
Bilirubin Urine: NEGATIVE
Glucose, UA: NEGATIVE mg/dL
Hgb urine dipstick: NEGATIVE
Ketones, ur: NEGATIVE mg/dL
Leukocytes,Ua: NEGATIVE
Nitrite: NEGATIVE
Protein, ur: 30 mg/dL — AB
Specific Gravity, Urine: 1.021 (ref 1.005–1.030)
pH: 6 (ref 5.0–8.0)

## 2023-01-28 LAB — COMPREHENSIVE METABOLIC PANEL
ALT: 10 U/L (ref 0–44)
AST: 13 U/L — ABNORMAL LOW (ref 15–41)
Albumin: 4.1 g/dL (ref 3.5–5.0)
Alkaline Phosphatase: 52 U/L (ref 38–126)
Anion gap: 9 (ref 5–15)
BUN: 9 mg/dL (ref 6–20)
CO2: 26 mmol/L (ref 22–32)
Calcium: 8.8 mg/dL — ABNORMAL LOW (ref 8.9–10.3)
Chloride: 102 mmol/L (ref 98–111)
Creatinine, Ser: 0.77 mg/dL (ref 0.44–1.00)
GFR, Estimated: >60 mL/min (ref >60)
Glucose, Bld: 84 mg/dL (ref 70–99)
Potassium: 3.4 mmol/L — ABNORMAL LOW (ref 3.5–5.1)
Sodium: 137 mmol/L (ref 135–145)
Total Bilirubin: 0.6 mg/dL (ref 0.3–1.2)
Total Protein: 7.1 g/dL (ref 6.5–8.1)

## 2023-01-28 LAB — CBC
HCT: 36.1 % (ref 36.0–46.0)
Hemoglobin: 11.3 g/dL — ABNORMAL LOW (ref 12.0–15.0)
MCH: 26.8 pg (ref 26.0–34.0)
MCHC: 31.3 g/dL (ref 30.0–36.0)
MCV: 85.7 fL (ref 80.0–100.0)
Platelets: 230 10*3/uL (ref 150–400)
RBC: 4.21 MIL/uL (ref 3.87–5.11)
RDW: 14.6 % (ref 11.5–15.5)
WBC: 11.9 10*3/uL — ABNORMAL HIGH (ref 4.0–10.5)
nRBC: 0 % (ref 0.0–0.2)

## 2023-01-28 LAB — I-STAT BETA HCG BLOOD, ED (MC, WL, AP ONLY): I-stat hCG, quantitative: <5 m[IU]/mL (ref <5)

## 2023-01-28 MED ORDER — IOHEXOL 300 MG/ML  SOLN
100.0000 mL | Freq: Once | INTRAMUSCULAR | Status: AC | PRN
  Administered 2023-01-28: 100 mL via INTRAVENOUS

## 2023-01-28 MED ORDER — DOXYCYCLINE HYCLATE 100 MG PO TABS
100.0000 mg | ORAL_TABLET | Freq: Once | ORAL | Status: AC
  Administered 2023-01-28: 100 mg via ORAL
  Filled 2023-01-28: qty 1

## 2023-01-28 MED ORDER — KETOROLAC TROMETHAMINE 15 MG/ML IJ SOLN
15.0000 mg | Freq: Once | INTRAMUSCULAR | Status: AC
  Administered 2023-01-28: 15 mg via INTRAVENOUS
  Filled 2023-01-28: qty 1

## 2023-01-28 MED ORDER — DOXYCYCLINE HYCLATE 100 MG PO CAPS: 100.0000 mg | ORAL_CAPSULE | Freq: Two times a day (BID) | ORAL | 0 refills | Status: AC

## 2023-01-28 MED ORDER — SODIUM CHLORIDE 0.9 % IV SOLN
1.0000 g | Freq: Once | INTRAVENOUS | Status: AC
  Administered 2023-01-28: 1 g via INTRAVENOUS
  Filled 2023-01-28: qty 10

## 2023-01-28 MED ORDER — SODIUM CHLORIDE 0.9 % IV BOLUS
1000.0000 mL | Freq: Once | INTRAVENOUS | Status: AC
  Administered 2023-01-28: 1000 mL via INTRAVENOUS

## 2023-01-28 MED ORDER — ONDANSETRON HCL 4 MG/2ML IJ SOLN
4.0000 mg | Freq: Once | INTRAMUSCULAR | Status: AC
  Administered 2023-01-28: 4 mg via INTRAVENOUS
  Filled 2023-01-28: qty 2

## 2023-01-28 NOTE — ED Triage Notes (Signed)
Pt c/o knot to LLQ, weakness, fatigue, and headache to left side.  Home interventions: tylenol, motrin   Started: Sunday

## 2023-01-28 NOTE — ED Provider Notes (Signed)
[facility] Provider Note  CSN: [id number] Arrival date & time: 01/28/23 1918  Chief Complaint(s) No chief complaint on file.  HPI Angela Manning is a 37 y.o. female with history of recently treated gonorrhea presenting to the emergency department with left lower quadrant abdominal pain.  Reports the pain is significant.  No modifying symptoms.  No nausea, vomiting, diarrhea.  She does report fevers and chills for the past day or so.  No vaginal discharge, bleeding.  No lightheadedness, dizziness.  No rashes.   Past Medical History Past Medical History:  Diagnosis Date   Bacterial vaginosis    Hypertension    Hypertension affecting pregnancy, antepartum 10/27/2019   Ovarian cyst    Pregnancy induced hypertension    Patient Active Problem List   Diagnosis Date Noted   Abnormal Pap smear of cervix 10/23/2021   Generalized anxiety disorder 04/19/2021   Essential hypertension 03/03/2020   Ovarian cyst, left 03/01/2015   Menorrhagia with irregular cycle 03/01/2015   Dyspareunia 03/01/2015   Home Medication(s) Prior to Admission medications   Medication Sig Start Date End Date Taking? Authorizing Provider  amLODipine (NORVASC) 10 MG tablet Take 1 tablet (10 mg total) by mouth daily. 12/13/22  Yes Talbot Grumbling, FNP  doxycycline (VIBRAMYCIN) 100 MG capsule Take 1 capsule (100 mg total) by mouth 2 (two) times daily for 14 days. 01/28/23 02/11/23 Yes Cristie Hem, MD  hydrochlorothiazide (HYDRODIURIL) 12.5 MG tablet Take 1 tablet (12.5 mg total) by mouth daily. 12/13/22  Yes Talbot Grumbling, FNP  clindamycin (CLEOCIN) 300 MG capsule Take 1 capsule (300 mg total) by mouth 2 (two) times daily. Patient not taking: Reported on 01/28/2023 01/08/23   Raspet, Derry Skill, PA-C  norethindrone (MICRONOR,CAMILA,ERRIN) 0.35 MG tablet Take 1 tablet (0.35 mg total) by mouth daily. Patient not taking: Reported on 08/23/2019 10/07/18 09/29/19   Sloan Leiter, MD                                                                                                                                    Past Surgical History Past Surgical History:  Procedure Laterality Date   DILATION AND EVACUATION N/A 02/21/2013   Procedure: DILATATION AND EVACUATION;  Surgeon: Donnamae Jude, MD;  Location: [facility];  Service: Gynecology;  Laterality: N/A;   Family History Family History  Problem Relation Age of Onset   Healthy Father    Stroke Mother    Hypertension Mother    Valvular heart disease Mother     Social History Social History   Tobacco Use   Smoking status: Former    Packs/day: 0.25    Types: Cigarettes   Smokeless tobacco: Former    Quit date: 01/08/2015   Tobacco comments:    quit 3 years ago   Vaping Use   Vaping Use: Never used  Substance Use Topics   Alcohol use:  Not Currently   Drug use: No   Allergies Flagyl [metronidazole]  Review of Systems Review of Systems  All other systems reviewed and are negative.   Physical Exam Vital Signs  I have reviewed the triage vital signs BP (!) 141/102   Pulse 89   Temp (!) 100.7 F (38.2 C) (Oral)   Resp 18   Wt 63.5 kg   LMP 01/17/2023 (Approximate)   SpO2 99%   BMI 22.60 kg/m  Physical Exam Vitals and nursing note reviewed.  Constitutional:      General: She is not in acute distress.    Appearance: She is well-developed.  HENT:     Head: Normocephalic and atraumatic.     Mouth/Throat:     Mouth: Mucous membranes are moist.  Eyes:     Pupils: Pupils are equal, round, and reactive to light.  Cardiovascular:     Rate and Rhythm: Normal rate and regular rhythm.     Heart sounds: No murmur heard. Pulmonary:     Effort: Pulmonary effort is normal. No respiratory distress.     Breath sounds: Normal breath sounds.  Abdominal:     General: Abdomen is flat.     Palpations: Abdomen is soft.     Tenderness: There is abdominal tenderness (LLQ).  Musculoskeletal:         General: No tenderness.     Right lower leg: No edema.     Left lower leg: No edema.  Skin:    General: Skin is warm and dry.  Neurological:     General: No focal deficit present.     Mental Status: She is alert. Mental status is at baseline.  Psychiatric:        Mood and Affect: Mood normal.        Behavior: Behavior normal.     ED Results and Treatments Labs (all labs ordered are listed, but only abnormal results are displayed) Labs Reviewed  COMPREHENSIVE METABOLIC PANEL - Abnormal; Notable for the following components:      Result Value   Potassium 3.4 (*)    Calcium 8.8 (*)    AST 13 (*)    All other components within normal limits  CBC - Abnormal; Notable for the following components:   WBC 11.9 (*)    Hemoglobin 11.3 (*)    All other components within normal limits  URINALYSIS, ROUTINE W REFLEX MICROSCOPIC - Abnormal; Notable for the following components:   APPearance HAZY (*)    Protein, ur 30 (*)    Bacteria, UA RARE (*)    All other components within normal limits  LIPASE, BLOOD  I-STAT BETA HCG BLOOD, ED ([facility], [facility], [facility] ONLY)  GC/CHLAMYDIA PROBE AMP () NOT AT [facility]                                                                                                                          Radiology US Pelvis Complete  Result Date: 01/28/2023 CLINICAL  DATA:  Left lower quadrant abdominal pain. EXAM: TRANSABDOMINAL AND TRANSVAGINAL ULTRASOUND OF PELVIS DOPPLER ULTRASOUND OF OVARIES TECHNIQUE: Both transabdominal and transvaginal ultrasound examinations of the pelvis were performed. Transabdominal technique was performed for global imaging of the pelvis including uterus, ovaries, adnexal regions, and pelvic cul-de-sac. It was necessary to proceed with endovaginal exam following the transabdominal exam to visualize the endometrium and ovaries. Color and duplex Doppler ultrasound was utilized to evaluate blood flow to the ovaries. COMPARISON:  CT abdomen pelvis  dated 01/28/2023. FINDINGS: Uterus Measurements: 10.5 x 4.7 x 5.9 cm = volume: 151 mL. No fibroids or other mass visualized. Endometrium Thickness: 9 mm.  No focal abnormality visualized. Right ovary Measurements: 4.2 x 2.7 x 3.1 cm = volume: 18 mL. Normal appearance/no adnexal mass. Left ovary Measurements: 4.1 x 2.7 x 3.6 cm = volume: 20 mL. Normal appearance/no adnexal mass. Pulsed Doppler evaluation of both ovaries demonstrates normal low-resistance arterial and venous waveforms. Other findings Complex dilated tubular structures with internal echogenic debris in the left hemipelvis and left adnexal lesion. There is thickened appearance of the wall of the tubular structures with some hyperemia. Findings most consistent with dilated fallopian tubes and likely represent pyosalpinx. Clinical correlation is recommended. IMPRESSION: Probable left sided pyosalpinx. Clinical correlation is recommended to evaluate for pelvic inflammatory disease. Electronically Signed   By: Anner Crete M.D.   On: 01/28/2023 22:25   US Transvaginal Non-OB  Result Date: 01/28/2023 CLINICAL DATA:  Left lower quadrant abdominal pain. EXAM: TRANSABDOMINAL AND TRANSVAGINAL ULTRASOUND OF PELVIS DOPPLER ULTRASOUND OF OVARIES TECHNIQUE: Both transabdominal and transvaginal ultrasound examinations of the pelvis were performed. Transabdominal technique was performed for global imaging of the pelvis including uterus, ovaries, adnexal regions, and pelvic cul-de-sac. It was necessary to proceed with endovaginal exam following the transabdominal exam to visualize the endometrium and ovaries. Color and duplex Doppler ultrasound was utilized to evaluate blood flow to the ovaries. COMPARISON:  CT abdomen pelvis dated 01/28/2023. FINDINGS: Uterus Measurements: 10.5 x 4.7 x 5.9 cm = volume: 151 mL. No fibroids or other mass visualized. Endometrium Thickness: 9 mm.  No focal abnormality visualized. Right ovary Measurements: 4.2 x 2.7 x 3.1 cm =  volume: 18 mL. Normal appearance/no adnexal mass. Left ovary Measurements: 4.1 x 2.7 x 3.6 cm = volume: 20 mL. Normal appearance/no adnexal mass. Pulsed Doppler evaluation of both ovaries demonstrates normal low-resistance arterial and venous waveforms. Other findings Complex dilated tubular structures with internal echogenic debris in the left hemipelvis and left adnexal lesion. There is thickened appearance of the wall of the tubular structures with some hyperemia. Findings most consistent with dilated fallopian tubes and likely represent pyosalpinx. Clinical correlation is recommended. IMPRESSION: Probable left sided pyosalpinx. Clinical correlation is recommended to evaluate for pelvic inflammatory disease. Electronically Signed   By: Anner Crete M.D.   On: 01/28/2023 22:25   [place] Art/Ven Flow Abd Pelv Doppler  Result Date: 01/28/2023 CLINICAL DATA:  Left lower quadrant abdominal pain. EXAM: TRANSABDOMINAL AND TRANSVAGINAL ULTRASOUND OF PELVIS DOPPLER ULTRASOUND OF OVARIES TECHNIQUE: Both transabdominal and transvaginal ultrasound examinations of the pelvis were performed. Transabdominal technique was performed for global imaging of the pelvis including uterus, ovaries, adnexal regions, and pelvic cul-de-sac. It was necessary to proceed with endovaginal exam following the transabdominal exam to visualize the endometrium and ovaries. Color and duplex Doppler ultrasound was utilized to evaluate blood flow to the ovaries. COMPARISON:  CT abdomen pelvis dated 01/28/2023. FINDINGS: Uterus Measurements: 10.5 x 4.7 x 5.9 cm = volume: 151  mL. No fibroids or other mass visualized. Endometrium Thickness: 9 mm.  No focal abnormality visualized. Right ovary Measurements: 4.2 x 2.7 x 3.1 cm = volume: 18 mL. Normal appearance/no adnexal mass. Left ovary Measurements: 4.1 x 2.7 x 3.6 cm = volume: 20 mL. Normal appearance/no adnexal mass. Pulsed Doppler evaluation of both ovaries demonstrates normal low-resistance  arterial and venous waveforms. Other findings Complex dilated tubular structures with internal echogenic debris in the left hemipelvis and left adnexal lesion. There is thickened appearance of the wall of the tubular structures with some hyperemia. Findings most consistent with dilated fallopian tubes and likely represent pyosalpinx. Clinical correlation is recommended. IMPRESSION: Probable left sided pyosalpinx. Clinical correlation is recommended to evaluate for pelvic inflammatory disease. Electronically Signed   By: Anner Crete M.D.   On: 01/28/2023 22:25   CT Abdomen Pelvis W Contrast  Result Date: 01/28/2023 CLINICAL DATA:  Left lower quadrant abdominal pain. EXAM: CT ABDOMEN AND PELVIS WITH CONTRAST TECHNIQUE: Multidetector CT imaging of the abdomen and pelvis was performed using the standard protocol following bolus administration of intravenous contrast. RADIATION DOSE REDUCTION: This exam was performed according to the departmental dose-optimization program which includes automated exposure control, adjustment of the mA and/or kV according to patient size and/or use of iterative reconstruction technique. CONTRAST:  197m OMNIPAQUE IOHEXOL 300 MG/ML  SOLN COMPARISON:  CT abdomen pelvis dated 07/31/2014. FINDINGS: Lower chest: The visualized lung bases are clear. No intra-abdominal free air.  No free fluid. Hepatobiliary: There is a 3.5 cm cyst right lobe of the liver, increased in size since 2015. The liver is otherwise unremarkable. No biliary dilatation. The gallbladder is unremarkable. Pancreas: Unremarkable. No pancreatic ductal dilatation or surrounding inflammatory changes. Spleen: Normal in size without focal abnormality. Adrenals/Urinary Tract: The adrenal glands unremarkable. The kidneys, visualized ureters, and urinary bladder appear unremarkable. Stomach/Bowel: There is moderate stool throughout the colon. There is no bowel obstruction or active inflammation the appendix is normal.  Vascular/Lymphatic: The abdominal aorta and IVC are unremarkable. No portal venous gas. There is no adenopathy. Reproductive: The uterus is anteverted. Dilated and mildly inflamed tubular structures in the left lower quadrant most consistent with hydrosalpinx. Several small left ovarian cysts noted. Findings concerning for pelvic inflammatory disease. Further evaluation with pelvic ultrasound recommended. The right ovary is unremarkable. Other: Small fat containing umbilical hernia. Musculoskeletal: No acute or significant osseous findings. IMPRESSION: 1. Findings concerning for pelvic inflammatory disease with left hydrosalpinx. Further evaluation with pelvic ultrasound recommended. 2. No bowel obstruction. Normal appendix. Electronically Signed   By: AAnner CreteM.D.   On: 01/28/2023 21:21    Pertinent labs & imaging results that were available during my care of the patient were reviewed by me and considered in my medical decision making (see MDM for details).  Medications Ordered in ED Medications  iohexol (OMNIPAQUE) 300 MG/ML solution 100 mL (100 mLs Intravenous Contrast Given 01/28/23 2106)  ketorolac (TORADOL) 15 MG/ML injection 15 mg (15 mg Intravenous Given 01/28/23 2228)  ondansetron (ZOFRAN) injection 4 mg (4 mg Intravenous Given 01/28/23 2228)  sodium chloride 0.9 % bolus 1,000 mL (0 mLs Intravenous Stopped 01/28/23 2313)  cefTRIAXone (ROCEPHIN) 1 g in sodium chloride 0.9 % 100 mL IVPB (0 g Intravenous Stopped 01/28/23 2327)  doxycycline (VIBRA-TABS) tablet 100 mg (100 mg Oral Given 01/28/23 2321)  Procedures Procedures  (including critical care time)  Medical Decision Making / ED Course   MDM:  37 year old female presenting to the emergency department left lower quadrant pain.  Patient febrile in the emergency department with left lower quadrant  abdominal tenderness.  CT scan to evaluate for intra-abdominal pathology such as diverticulitis, TOA, colitis, obstruction, perforation, volvulus, pyelonephritis.  CT scan demonstrates possible left PID.  Urinalysis not concerning for infection.  Transvaginal ultrasound demonstrates left pyosalpinx.  Discussed with Dr. Damita Dunnings of OB/GYN, recommends outpatient treatment.  Will treat with ceftriaxone and doxycycline.  Sent repeat GC probe on patient's urine.  Offered repeat pelvic exam to evaluate for CMT but patient declines, and she does report that the transvaginal ultrasound was painful when pressing on her cervix.  Discussed return precautions with the patient.  Advised abstinence from further sexual intercourse until cleared by OB/GYN. Will discharge patient to home. All questions answered. Patient comfortable with plan of discharge. Return precautions discussed with patient and specified on the after visit summary.   Clinical Course as of 01/28/23 2339  Tue Jan 28, 2023  2252 CT Abdomen Pelvis W Contrast [WS]    Clinical Course User Index [WS] Cristie Hem, MD     Additional history obtained: -Additional history obtained from spouse -External records from outside source obtained and reviewed including: Chart review including previous notes, labs, imaging, consultation notes including UC note today   Lab Tests: -I ordered, reviewed, and interpreted labs.   The pertinent results include:   Labs Reviewed  COMPREHENSIVE METABOLIC PANEL - Abnormal; Notable for the following components:      Result Value   Potassium 3.4 (*)    Calcium 8.8 (*)    AST 13 (*)    All other components within normal limits  CBC - Abnormal; Notable for the following components:   WBC 11.9 (*)    Hemoglobin 11.3 (*)    All other components within normal limits  URINALYSIS, ROUTINE W REFLEX MICROSCOPIC - Abnormal; Notable for the following components:   APPearance HAZY (*)    Protein, ur 30 (*)     Bacteria, UA RARE (*)    All other components within normal limits  LIPASE, BLOOD  I-STAT BETA HCG BLOOD, ED ([facility], [facility], [facility] ONLY)  GC/CHLAMYDIA PROBE AMP ([facility]) NOT AT [facility]    Notable for mild leukocytosis  EKG     Imaging Studies ordered: I ordered imaging studies including CT ABD, TVUS On my interpretation imaging demonstrates findins of pyosalpinx  I independently visualized and interpreted imaging. I agree with the radiologist interpretation   Medicines ordered and prescription drug management: Meds ordered this encounter  Medications   iohexol (OMNIPAQUE) 300 MG/ML solution 100 mL   ketorolac (TORADOL) 15 MG/ML injection 15 mg   ondansetron (ZOFRAN) injection 4 mg   sodium chloride 0.9 % bolus 1,000 mL   cefTRIAXone (ROCEPHIN) 1 g in sodium chloride 0.9 % 100 mL IVPB    Order Specific Question:   Antibiotic Indication:    Answer:   STD   doxycycline (VIBRA-TABS) tablet 100 mg   doxycycline (VIBRAMYCIN) 100 MG capsule    Sig: Take 1 capsule (100 mg total) by mouth 2 (two) times daily for 14 days.    Dispense:  28 capsule    Refill:  0    -I have reviewed the patients home medicines and have made adjustments as needed   Consultations Obtained: I requested consultation with the ob/gyn dr. Damita Dunnings,  and discussed  lab and imaging findings as well as pertinent plan - they recommend: outpatient treatment and follow up with them   Cardiac Monitoring: The patient was maintained on a cardiac monitor.  I personally viewed and interpreted the cardiac monitored which showed an underlying rhythm of: NSR  Social Determinants of Health:  Diagnosis or treatment significantly limited by social determinants of health: former smoker   Reevaluation: After the interventions noted above, I reevaluated the patient and found that they have improved  Co morbidities that complicate the patient evaluation  Past Medical History:  Diagnosis Date   Bacterial vaginosis     Hypertension    Hypertension affecting pregnancy, antepartum 10/27/2019   Ovarian cyst    Pregnancy induced hypertension       Dispostion: Disposition decision including need for hospitalization was considered, and patient discharged from emergency department.    Final Clinical Impression(s) / ED Diagnoses Final diagnoses:  Pelvic inflammatory disease     This chart was dictated using voice recognition software.  Despite best efforts to proofread,  errors can occur which can change the documentation meaning.    Cristie Hem, MD 01/28/23 [phone]

## 2023-01-28 NOTE — Discharge Instructions (Addendum)
We evaluated you for your abdominal pain.  Your ultrasound and CT scan showed signs of pelvic inflammatory disease, an infection of your uterus.  We treated you with a dose of antibiotics in the emergency department and have prescribed you 2 weeks of antibiotics to take at home.  This is sometimes caused by a sexually transmitted disease.  We have sent repeat gonorrhea and Chlamydia testing.  Please call your OB/GYN to schedule follow-up appointment as soon as possible to make sure that your symptoms resolved.  If you have worsening abdominal pain, persistent nausea or vomiting, uncontrolled fevers, or any other concerning symptoms, please return to the emergency department.

## 2023-01-28 NOTE — Discharge Instructions (Addendum)
Please go to the emergency room for further evaluation and treatment of your symptoms

## 2023-01-28 NOTE — ED Triage Notes (Signed)
Presents from home for LLQ pain and sensation of a "knot", L HA, and weakness since sunday. Constant pain.  Denies N/V/D  Sent here from [facility] for this.  LMP 2/9  Last BM Sunday

## 2023-01-28 NOTE — ED Provider Notes (Signed)
[facility]    CSN: [id number] Arrival date & time: 01/28/23  1755      History   Chief Complaint Chief Complaint  Patient presents with   Abdominal Pain    I got a knot In my stomach at the bottom and it hurts severely - Entered by patient   Fatigue   Headache    HPI Angela Manning is a 37 y.o. female presents for evaluation of abdominal pain.  Patient reports 3 days of a left lower quadrant/mid lower abdominal pain that has been constant since it began.  She states it radiates across her abdomen and sometimes around to her back.  She endorses headache and fatigue but denies fevers, nausea/vomiting/diarrhea.  Endorses bloating.  States she feels a hard knot in her left lower quadrant.  Denies dysuria.  No history of abdominal surgeries.  States she is barely able to eat but is try to stay hydrated.  No recent travel.  Denies pregnancy risk.  Does have history of ovarian cyst.  She has not taken any OTC medications.  No other concerns at this time.   Abdominal Pain Associated symptoms: fever   Headache Associated symptoms: abdominal pain and fever     Past Medical History:  Diagnosis Date   Bacterial vaginosis    Hypertension    Hypertension affecting pregnancy, antepartum 10/27/2019   Ovarian cyst    Pregnancy induced hypertension     Patient Active Problem List   Diagnosis Date Noted   Abnormal Pap smear of cervix 10/23/2021   Generalized anxiety disorder 04/19/2021   Essential hypertension 03/03/2020   Ovarian cyst, left 03/01/2015   Menorrhagia with irregular cycle 03/01/2015   Dyspareunia 03/01/2015    Past Surgical History:  Procedure Laterality Date   DILATION AND EVACUATION N/A 02/21/2013   Procedure: DILATATION AND EVACUATION;  Surgeon: Angela Jude, MD;  Location: [facility];  Service: Gynecology;  Laterality: N/A;    OB History     Gravida  5   Para  4   Term  3   Preterm  1   AB      Living  4      SAB      IAB      Ectopic       Multiple      Live Births  4            Home Medications    Prior to Admission medications   Medication Sig Start Date End Date Taking? Authorizing Provider  amLODipine (NORVASC) 10 MG tablet Take 1 tablet (10 mg total) by mouth daily. 12/13/22   Angela Grumbling, FNP  clindamycin (CLEOCIN) 300 MG capsule Take 1 capsule (300 mg total) by mouth 2 (two) times daily. 01/08/23   Angela Manning, Angela Skill, PA-C  hydrochlorothiazide (HYDRODIURIL) 12.5 MG tablet Take 1 tablet (12.5 mg total) by mouth daily. 12/13/22   Angela Grumbling, FNP  Lactobacillus (PROBIOTIC ACIDOPHILUS PO) Take 1 tablet by mouth daily.    [provider]  Multiple Vitamins-Minerals (WOMENS MULTI PO) Take 1 tablet by mouth daily.    [provider]  norethindrone (MICRONOR,CAMILA,ERRIN) 0.35 MG tablet Take 1 tablet (0.35 mg total) by mouth daily. Patient not taking: Reported on 08/23/2019 10/07/18 09/29/19  Angela Leiter, MD    Family History Family History  Problem Relation Age of Onset   Healthy Father    Stroke Mother    Hypertension Mother    Valvular heart disease  Mother     Social History Social History   Tobacco Use   Smoking status: Former    Packs/day: 0.25    Types: Cigarettes   Smokeless tobacco: Former    Quit date: 01/08/2015   Tobacco comments:    quit 3 years ago   Vaping Use   Vaping Use: Never used  Substance Use Topics   Alcohol use: Not Currently   Drug use: No     Allergies   Flagyl [metronidazole]   Review of Systems Review of Systems  Constitutional:  Positive for fever.  Gastrointestinal:  Positive for abdominal pain.  Neurological:  Positive for headaches.     Physical Exam Triage Vital Signs ED Triage Vitals  Enc Vitals Group     BP 01/28/23 1848 (!) 142/88     Pulse Rate 01/28/23 1848 90     Resp 01/28/23 1848 18     Temp 01/28/23 1848 (!) 100.5 F (38.1 C)     Temp Source 01/28/23 1848 Oral     SpO2 01/28/23 1848 98 %     Weight --       Height --      Head Circumference --      Peak Flow --      Pain Score 01/28/23 1847 8     Pain Loc --      Pain Edu? --      Excl. in [place]? --    No data found.  Updated Vital Signs BP (!) 142/88 (BP Location: Right Arm)   Pulse 90   Temp (!) 100.5 F (38.1 C) (Oral)   Resp 18   LMP 12/21/2022 (Approximate)   SpO2 98%   Visual Acuity Right Eye Distance:   Left Eye Distance:   Bilateral Distance:    Right Eye Near:   Left Eye Near:    Bilateral Near:     Physical Exam Vitals and nursing note reviewed.  Constitutional:      Appearance: She is well-developed.  HENT:     Head: Normocephalic and atraumatic.  Eyes:     Pupils: Pupils are equal, round, and reactive to light.  Cardiovascular:     Rate and Rhythm: Normal rate.  Pulmonary:     Effort: Pulmonary effort is normal.  Abdominal:     General: Bowel sounds are normal.     Tenderness: There is abdominal tenderness in the right lower quadrant, left upper quadrant and left lower quadrant. Positive signs include Rovsing's sign and McBurney's sign.     Comments: Inguinal adenopathy on the left side  Skin:    General: Skin is warm and dry.  Neurological:     General: No focal deficit present.     Mental Status: She is alert and oriented to person, place, and time.  Psychiatric:        Mood and Affect: Mood normal.        Behavior: Behavior normal.      UC Treatments / Results  Labs (all labs ordered are listed, but only abnormal results are displayed) Labs Reviewed - No data to display  EKG   Radiology No results found.  Procedures Procedures (including critical care time)  Medications Ordered in UC Medications - No data to display  Initial Impression / Assessment and Plan / UC Course  I have reviewed the triage vital signs and the nursing notes.  Pertinent labs & imaging results that were available during my care of the patient were reviewed by me  and considered in my medical decision making  (see chart for details).     Reviewed exam and symptoms with patient.  Discussed limitations and abilities of urgent care.  Given exam, fever, and symptoms I advise she go to the emergency room for further evaluation and treatment.  Patient is in agreement with plan will go POV to the emergency room with her family driving.  She was instructed to pull over and call 911 for any worsening symptoms that occur in transit and she verbalized understanding Final Clinical Impressions(s) / UC Diagnoses   Final diagnoses:  Lower abdominal pain  Fever, unspecified     Discharge Instructions      Please go to the emergency room for further evaluation and treatment of your symptoms   ED Prescriptions   None    PDMP not reviewed this encounter.   Melynda Ripple, NP 01/28/23 Drema [place]on

## 2023-01-28 NOTE — ED Notes (Signed)
Patient is being discharged from the Urgent Care and sent to the Emergency Department via POV . Per Rosario Adie NP , patient is in need of higher level of care due to need for further evaluation . Patient is aware and verbalizes understanding of plan of care.  Vitals:   01/28/23 1848  BP: (!) 142/88  Pulse: 90  Resp: 18  Temp: (!) 100.5 F (38.1 C)  SpO2: 98%

## 2023-01-28 NOTE — Consult Note (Signed)
   OB/GYN Telephone Consult  Angela Manning is a 37 y.o. [id number] presenting with LLQ pain and HA.   I was called for a consult regarding the care of this patient by [facility].    The provider had a clinical question regarding possible outpt management for PID.    The provider presented the following relevant clinical information and I performed a chart review on the patient and reviewed available documentation:  Pt is a 37 yo G5P4 who presented with LLQ pain that started Sunday. She was diagnosed with GC in January 12/13/22 along with yeast and BV. She was treated for these and then had TOC on 1/31 which was negative for GC but positive for BV. She was sent home with clindamycin from that visit at the Urgent Care.    I reviewed her pelvic ultrasound images independently and my findings are: consistent with the radiologist interpretation. The findings are suspicious for pyosalpinx on the left. Does not appear c/w TOA.  CBC noted to be 11.9. UPT negative and LMP 2/6.    BP (!) 161/105   Pulse 92   Temp (!) 100.7 F (38.2 C) (Oral)   Resp 18   Wt 63.5 kg   LMP 01/17/2023 (Approximate)   SpO2 100%   BMI 22.60 kg/m   Exam- performed by consulting provider   Recommendations:  - Pt has no contraindications to outpatient management (I.e. able to tolerate PO) and assurance of follow up - I have sent a message to [facility] for her to have a follow up appointment in 2-4 weeks.  - Would send home on Doxy after getting Ceftriaxone in the ED. She has an allergy to Flagyl and not also not required for diagnosis of PID in this setting.    Thank you for this consult and if additional recommendations are needed please call [phone] for the OB/GYN attending on service at [facility].   I spent approximately 5 minutes directly consulting with the provider and verbally discussing this case. Additionally 10 minutes minutes was spent performing chart review and documentation.   [name],  MD Attending [place], [facility] for [facility], [place]

## 2023-01-29 LAB — GC/CHLAMYDIA PROBE AMP (~~LOC~~) NOT AT ARMC
Chlamydia: NEGATIVE
Comment: NEGATIVE
Comment: NORMAL
Neisseria Gonorrhea: NEGATIVE

## 2023-01-30 ENCOUNTER — Telehealth: Payer: Self-pay | Admitting: Obstetrics and Gynecology

## 2023-01-30 NOTE — Transitions of Care (Post Inpatient/ED Visit) (Signed)
   01/30/2023  Name: Angela Manning MRN: [id number] DOB: August 08, 1986  Today's TOC FU Call Status: Today's TOC FU Call Status:: Unsuccessul Call (1st Attempt) Unsuccessful Call (1st Attempt) Date: 01/30/23  Attempted to reach the patient regarding the most recent Inpatient/ED visit.  Follow Up Plan: Additional outreach attempts will be made to reach the patient to complete the Transitions of Care (Post Inpatient/ED visit) call.   Aida Raider RN, BSN [place]  [facility] [employer] - Managed Medicaid High Risk [phone]

## 2023-02-25 ENCOUNTER — Encounter: Payer: Self-pay | Admitting: Obstetrics and Gynecology

## 2023-02-25 ENCOUNTER — Other Ambulatory Visit (HOSPITAL_COMMUNITY)
Admission: RE | Admit: 2023-02-25 | Discharge: 2023-02-25 | Disposition: A | Discharge status: Home / Self Care | Payer: Medicaid Other | Source: Ambulatory Visit | Attending: Obstetrics and Gynecology | Admitting: Obstetrics and Gynecology

## 2023-02-25 ENCOUNTER — Other Ambulatory Visit: Payer: Self-pay

## 2023-02-25 ENCOUNTER — Ambulatory Visit (INDEPENDENT_AMBULATORY_CARE_PROVIDER_SITE_OTHER): Payer: Medicaid Other | Admitting: Obstetrics and Gynecology

## 2023-02-25 VITALS — BP 179/125 | HR 80 | Wt 145.5 lb

## 2023-02-25 DIAGNOSIS — Z113 Encounter for screening for infections with a predominantly sexual mode of transmission: Secondary | ICD-10-CM | POA: Diagnosis not present

## 2023-02-25 DIAGNOSIS — I10 Essential (primary) hypertension: Secondary | ICD-10-CM

## 2023-02-25 DIAGNOSIS — N730 Acute parametritis and pelvic cellulitis: Secondary | ICD-10-CM | POA: Diagnosis not present

## 2023-02-25 DIAGNOSIS — N898 Other specified noninflammatory disorders of vagina: Secondary | ICD-10-CM | POA: Diagnosis not present

## 2023-02-25 MED ORDER — HYDROCHLOROTHIAZIDE 12.5 MG PO TABS: 12.5000 mg | ORAL_TABLET | Freq: Every day | ORAL | 1 refills | Status: DC

## 2023-02-25 MED ORDER — HYDROCHLOROTHIAZIDE 12.5 MG PO TABS: 12.5000 mg | ORAL_TABLET | Freq: Every day | ORAL | 0 refills | Status: DC

## 2023-02-25 MED ORDER — AMLODIPINE BESYLATE 10 MG PO TABS: 10.0000 mg | ORAL_TABLET | Freq: Every day | ORAL | 1 refills | Status: DC

## 2023-02-25 MED ORDER — AMLODIPINE BESYLATE 10 MG PO TABS: 10.0000 mg | ORAL_TABLET | Freq: Every day | ORAL | 0 refills | Status: DC

## 2023-02-25 NOTE — Progress Notes (Signed)
GYNECOLOGY PATIENT Patient name: Angela Manning MRN [id number]  Date of birth: 22-Jun-1986 Chief Complaint:   Follow-up and Pelvic Inflammatory Disease     History:  Angela Manning is a 38 y.o. [id number] being seen today for PID followup. Seen and diagnosed in ED 2/20 w/ PID with negative STI testing at that time. Since then she has had improvement in her pelvic pain and completed her ABX course. Not currently sexually active and not using any contraception - has completed childbearing. Has known dx of HTN, ran out of her medication.       Gynecologic History Patient's last menstrual period was 02/15/2023 (exact date). Contraception: none Last Pap:     Component Value Date/Time   DIAGPAP  09/25/2021 1629    - Negative for intraepithelial lesion or malignancy (NILM)   DIAGPAP (A) 10/07/2018 0000    HIGH GRADE SQUAMOUS INTRAEPITHELIAL LESION: CIN-2/ CIN-3/CIS (HSIL).   [place] Positive (A) 09/25/2021 1629   ADEQPAP  09/25/2021 1629    Satisfactory for evaluation; transformation zone component ABSENT.   ADEQPAP (A) 10/07/2018 0000    Satisfactory for evaluation  endocervical/transformation zone component PRESENT.   Colposcopy CIN 1 IN 2022 Last Mammogram: n/a Last Colonoscopy: n/a  Obstetric History OB History  Gravida Para Term Preterm AB Living  5 4 3 1   4   SAB IAB Ectopic Multiple Live Births          4    # Outcome Date GA Lbr Len/2nd Weight Sex Delivery Anes PTL Lv  5 Gravida           4 Term      Vag-Spont   LIV  3 Term      Vag-Spont   LIV  2 Term      Vag-Spont   LIV  1 Preterm      Vag-Spont   LIV    Past Medical History:  Diagnosis Date   Bacterial vaginosis    Hypertension    Hypertension affecting pregnancy, antepartum 10/27/2019   Ovarian cyst    Pregnancy induced hypertension     Past Surgical History:  Procedure Laterality Date   DILATION AND EVACUATION N/A 02/21/2013   Procedure: DILATATION AND EVACUATION;  Surgeon: Donnamae Jude, MD;  Location: [facility];  Service: Gynecology;  Laterality: N/A;    Current Outpatient Medications on File Prior to Visit  Medication Sig Dispense Refill   hydrochlorothiazide (HYDRODIURIL) 12.5 MG tablet Take 1 tablet (12.5 mg total) by mouth daily. 30 tablet 0   amLODipine (NORVASC) 10 MG tablet Take 1 tablet (10 mg total) by mouth daily. (Patient not taking: Reported on 02/25/2023) 30 tablet 0   clindamycin (CLEOCIN) 300 MG capsule Take 1 capsule (300 mg total) by mouth 2 (two) times daily. (Patient not taking: Reported on 01/28/2023) 14 capsule 0   [DISCONTINUED] norethindrone (MICRONOR,CAMILA,ERRIN) 0.35 MG tablet Take 1 tablet (0.35 mg total) by mouth daily. (Patient not taking: Reported on 08/23/2019) 1 Package 11   No current facility-administered medications on file prior to visit.    Allergies  Allergen Reactions   Flagyl [Metronidazole] Rash and Other (See Comments)    "Breaks out in blisters"    Social History:  reports that she has quit smoking. Her smoking use included cigarettes. She smoked an average of .25 packs per day. She quit smokeless tobacco use about 8 years ago. She reports that she does not currently use alcohol. She reports that she does not use drugs.  Family History  Problem Relation Age of Onset   Healthy Father    Stroke Mother    Hypertension Mother    Valvular heart disease Mother     The following portions of the patient's history were reviewed and updated as appropriate: allergies, current medications, past family history, past medical history, past social history, past surgical history and problem list.  Review of Systems Pertinent items noted in HPI and remainder of comprehensive ROS otherwise negative.  Physical Exam:  BP (!) 158/112   Pulse 80   Wt 145 lb 8 oz (66 kg)   LMP 02/15/2023 (Exact Date)   BMI 23.48 kg/m  Physical Exam Vitals and nursing note reviewed. Exam conducted with a chaperone present.  Constitutional:      Appearance: Normal appearance.   Cardiovascular:     Rate and Rhythm: Normal rate.  Pulmonary:     Effort: Pulmonary effort is normal.     Breath sounds: Normal breath sounds.  Genitourinary:    Comments: Mild left sided adnexal tenderness Minimal CMT Nontender right adnexa  Neurological:     General: No focal deficit present.     Mental Status: She is alert and oriented to person, place, and time.  Psychiatric:        Mood and Affect: Mood normal.        Behavior: Behavior normal.        Thought Content: Thought content normal.        Judgment: Judgment normal.        Assessment and Plan:   1. PID (acute pelvic inflammatory disease) Reassuring exam, clinically resolving PID. Recommend interval [place] to reassess tube as reported possible pyosalpinx. Has completed childbearing but reviewed that should pregnancy occur, increased risk of tubal ectopic pregnancy as PID and pyosalpinx more likely to have damaged tube. Answered all questionss regarding PID.  - US PELVIC COMPLETE WITH TRANSVAGINAL; Future  2. Essential hypertension Referral to HTN clinic, medications refilled for now  Encourage follow up with HTN clinic and/or PCP - AMB REFERRAL TO ADVANCED HTN CLINIC - amLODipine (NORVASC) 10 MG tablet; Take 1 tablet (10 mg total) by mouth daily.  Dispense: 30 tablet; Refill: 0 - hydrochlorothiazide (HYDRODIURIL) 12.5 MG tablet; Take 1 tablet (12.5 mg total) by mouth daily.  Dispense: 30 tablet; Refill: 0  3. Screening for STD (sexually transmitted disease) Swabs today for vaginal odor   Routine preventative health maintenance measures emphasized - will need annual w/ pap  Please refer to After Visit Summary for other counseling recommendations.   Follow-up: No follow-ups on file.      Darliss Cheney, MD Obstetrician & Gynecologist, [facility] for [employer], [place]

## 2023-02-27 ENCOUNTER — Encounter: Payer: Self-pay | Admitting: Obstetrics and Gynecology

## 2023-02-27 ENCOUNTER — Other Ambulatory Visit: Payer: Self-pay | Admitting: Obstetrics and Gynecology

## 2023-02-27 DIAGNOSIS — N760 Acute vaginitis: Secondary | ICD-10-CM

## 2023-02-27 DIAGNOSIS — B9689 Other specified bacterial agents as the cause of diseases classified elsewhere: Secondary | ICD-10-CM

## 2023-02-27 LAB — CERVICOVAGINAL ANCILLARY ONLY
Bacterial Vaginitis (gardnerella): POSITIVE — AB
Candida Glabrata: NEGATIVE
Candida Vaginitis: NEGATIVE
Comment: NEGATIVE
Comment: NEGATIVE
Comment: NEGATIVE

## 2023-02-27 MED ORDER — CLINDAMYCIN PHOSPHATE 2 % VA CREA: 1.0000 | TOPICAL_CREAM | Freq: Every day | VAGINAL | 0 refills | Status: DC

## 2023-02-28 MED ORDER — CLINDAMYCIN HCL 300 MG PO CAPS: 300.0000 mg | ORAL_CAPSULE | Freq: Two times a day (BID) | ORAL | 0 refills | Status: DC

## 2023-03-25 ENCOUNTER — Other Ambulatory Visit: Payer: Medicaid Other

## 2023-03-26 ENCOUNTER — Ambulatory Visit (HOSPITAL_COMMUNITY)
Admission: RE | Admit: 2023-03-26 | Discharge: 2023-03-26 | Disposition: A | Discharge status: Home / Self Care | Payer: Medicaid Other | Source: Ambulatory Visit | Attending: Obstetrics and Gynecology | Admitting: Obstetrics and Gynecology

## 2023-03-26 DIAGNOSIS — N730 Acute parametritis and pelvic cellulitis: Secondary | ICD-10-CM | POA: Diagnosis present

## 2023-04-30 ENCOUNTER — Institutional Professional Consult (permissible substitution) (HOSPITAL_BASED_OUTPATIENT_CLINIC_OR_DEPARTMENT_OTHER): Payer: Medicaid Other | Admitting: Cardiovascular Disease

## 2023-04-30 NOTE — Progress Notes (Deleted)
Advanced Hypertension Clinic Initial Assessment:    Date:  04/30/2023   ID:  Angela Manning, DOB [birth year], MRN [id number]  PCP:  System, Provider Not In  Cardiologist:  None  Nephrologist:  Referring MD: Lorriane Shire, MD   CC: Hypertension  History of Present Illness:    Angela Manning is a 37 y.o. female with a hx of hypertension and anxiety here to establish care in the Advanced Hypertension Clinic.  She saw her OB/GYN on 02/25/2023 and her blood pressure was initially 179/125 and then 158/112 on repeat.  Amlodipine and HCTZ were refilled and she was referred to the antihypertension clinic.   Previous antihypertensives:  Secondary Causes of Hypertension  Medications/Herbal: OCP, steroids, stimulants, antidepressants, weight loss medication, immune suppressants, NSAIDs, sympathomimetics, alcohol, caffeine, licorice, ginseng, St. John's wort, chemo  Sleep Apnea Renal artery stenosis Hyperaldosteronism Hyper/hypothyroidism Pheochromocytoma: palpitations, tachycardia, headache, diaphoresis (plasma metanephrines) Cushing's syndrome: Cushingoid facies, central obesity, proximal muscle weakness, and ecchymoses, adrenal incidentaloma (cortisol) Coarctation of the aorta  Past Medical History:  Diagnosis Date   Bacterial vaginosis    Hypertension    Hypertension affecting pregnancy, antepartum 10/27/2019   Ovarian cyst    Pregnancy induced hypertension     Past Surgical History:  Procedure Laterality Date   DILATION AND EVACUATION N/A 02/21/2013   Procedure: DILATATION AND EVACUATION;  Surgeon: Reva Bores, MD;  Location: [facility];  Service: Gynecology;  Laterality: N/A;    Current Medications: No outpatient medications have been marked as taking for the 04/30/23 encounter (Appointment) with Chilton Si, MD.     Allergies:   Flagyl [metronidazole]   Social History   Socioeconomic History   Marital status: Single    Spouse name: Not on file   Number of  children: Not on file   Years of education: Not on file   Highest education level: Not on file  Occupational History   Not on file  Tobacco Use   Smoking status: Former    Packs/day: .25    Types: Cigarettes   Smokeless tobacco: Former    Quit date: 01/08/2015   Tobacco comments:    quit 3 years ago   Vaping Use   Vaping Use: Never used  Substance and Sexual Activity   Alcohol use: Not Currently   Drug use: No   Sexual activity: Yes    Partners: Male    Birth control/protection: None  Other Topics Concern   Not on file  Social History Narrative   Not on file   Social Determinants of Health   Financial Resource Strain: Not on file  Food Insecurity: No Food Insecurity (12/17/2021)   Hunger Vital Sign    Worried About Running Out of Food in the Last Year: Never true    Ran Out of Food in the Last Year: Never true  Transportation Needs: No Transportation Needs (12/17/2021)   PRAPARE - Administrator, Civil Service (Medical): No    Lack of Transportation (Non-Medical): No  Physical Activity: Not on file  Stress: Not on file  Social Connections: Not on file     Family History: The patient's ***family history includes Healthy in her father; Hypertension in her mother; Stroke in her mother; Valvular heart disease in her mother.  ROS:   Please see the history of present illness.    *** All other systems reviewed and are negative.  EKGs/Labs/Other Studies Reviewed:    EKG:  EKG is *** ordered today.  The ekg ordered  today demonstrates ***  Recent Labs: 01/28/2023: ALT 10; BUN 9; Creatinine, Ser 0.77; Hemoglobin 11.3; Platelets 230; Potassium 3.4; Sodium 137   Recent Lipid Panel No results found for: "CHOL", "TRIG", "HDL", "CHOLHDL", "VLDL", "LDLCALC", "LDLDIRECT"  Physical Exam:   VS:  There were no vitals taken for this visit. , BMI There is no height or weight on file to calculate BMI. GENERAL:  Well appearing HEENT: Pupils equal round and reactive, fundi  not visualized, oral mucosa unremarkable NECK:  No jugular venous distention, waveform within normal limits, carotid upstroke brisk and symmetric, no bruits, no thyromegaly LYMPHATICS:  No cervical adenopathy LUNGS:  Clear to auscultation bilaterally HEART:  RRR.  PMI not displaced or sustained,S1 and S2 within normal limits, no S3, no S4, no clicks, no rubs, *** murmurs ABD:  Flat, positive bowel sounds normal in frequency in pitch, no bruits, no rebound, no guarding, no midline pulsatile mass, no hepatomegaly, no splenomegaly EXT:  2 plus pulses throughout, no edema, no cyanosis no clubbing SKIN:  No rashes no nodules NEURO:  Cranial nerves II through XII grossly intact, motor grossly intact throughout PSYCH:  Cognitively intact, oriented to person place and time   ASSESSMENT/PLAN:    No problem-specific Assessment & Plan notes found for this encounter.   Screening for Secondary Hypertension: { Click here to document screening for secondary causes of HTN  :[id number]}    Relevant Labs/Studies:    Latest Ref Rng & Units 01/28/2023    7:40 PM 10/09/2021   10:03 PM 04/19/2021   11:12 AM  Basic Labs  Sodium 135 - 145 mmol/L 137  133  138   Potassium 3.5 - 5.1 mmol/L 3.4  3.3  4.5   Creatinine 0.44 - 1.00 mg/dL 1.19  1.47  8.29        Latest Ref Rng & Units 09/25/2021    4:33 PM 10/07/2018   11:17 AM  Thyroid   TSH 0.450 - 4.500 uIU/mL 0.808  0.403        Latest Ref Rng & Units 04/19/2021   11:12 AM  Renin/Aldosterone   Aldosterone 0.0 - 30.0 ng/dL 5.0   Renin 5.621 - 3.086 ng/mL/hr 1.187   Aldos/Renin Ratio 0.0 - 30.0 4.2              10/08/2021   11:14 AM  Renovascular   Renal Artery [place] Completed Yes       Disposition:    FU with MD/PharmD in {gen number [id number]} {Days to years:10300}    Medication Adjustments/Labs and Tests Ordered: Current medicines are reviewed at length with the patient today.  Concerns regarding medicines are outlined above.  No  orders of the defined types were placed in this encounter.  No orders of the defined types were placed in this encounter.    Signed, Chilton Si, MD  04/30/2023 9:16 AM    [facility]

## 2023-05-07 ENCOUNTER — Encounter: Payer: Self-pay | Admitting: Student

## 2023-05-07 ENCOUNTER — Ambulatory Visit (INDEPENDENT_AMBULATORY_CARE_PROVIDER_SITE_OTHER): Payer: Medicaid Other | Admitting: Internal Medicine

## 2023-05-07 ENCOUNTER — Ambulatory Visit (HOSPITAL_COMMUNITY)
Admission: RE | Admit: 2023-05-07 | Discharge: 2023-05-07 | Disposition: A | Discharge status: Home / Self Care | Payer: Medicaid Other | Source: Ambulatory Visit | Attending: Internal Medicine | Admitting: Internal Medicine

## 2023-05-07 VITALS — BP 123/92 | HR 65 | Temp 98.1°F | Ht 66.0 in | Wt 147.7 lb

## 2023-05-07 DIAGNOSIS — R519 Headache, unspecified: Secondary | ICD-10-CM

## 2023-05-07 MED ORDER — KETOROLAC TROMETHAMINE 30 MG/ML IJ SOLN
30.0000 mg | Freq: Once | INTRAMUSCULAR | Status: AC
  Administered 2023-05-07: 30 mg via INTRAMUSCULAR

## 2023-05-07 NOTE — Progress Notes (Signed)
   Acute Office Visit  Subjective:     Patient ID: Angela Manning, female    DOB: November 12, 1986, 37 y.o.   MRN: [id number]  Chief Complaint  Patient presents with   Headache    Throbbing and pounding pain    HPI Patient is in today for headache onset about 3 weeks ago worsening over the last few days, characterized by throbbing bilateral pain radiating to posterior head during maneuvers increasing intracranial pressure including singing, cough, and bending over.  This morning awakened her from sleep.  No prior history of migraines or other headache syndromes.  No new medications.  No sinus symptoms.  No nausea.  No fever.  No changes in cognition or motor function.  No increase in stress, no change in living situation.  Has taken Tylenol with little relief.     Objective:    BP (!) 123/92 (BP Location: Right Arm, Patient Position: Sitting, Cuff Size: Normal)   Pulse 65   Temp 98.1 F (36.7 C) (Oral)   Ht 5\' 6"  (1.676 m)   Wt 147 lb 11.2 oz (67 kg)   SpO2 97%   BMI 23.84 kg/m    Physical Exam Uncomfortable appearing woman, alert, conversant, preferring low light in the room, holding her head while leaning against exam table.  No tenderness to temporal palpation.  Eyes conjugate gaze and equal pupils.  No tears, lids symmetric. No nasal congestion.  Sxs of deep head and bilateral temporal pain induced with valsalva maneuvers and bending over with head below heart.  Skin warm and dry, radial pulses 2+ regular.      Assessment & Plan:   Problem List Items Addressed This Visit   None Visit Diagnoses     Acute nonintractable headache, unspecified headache type    -  Primary   Relevant Medications   ketorolac (TORADOL) 30 MG/ML injection 30 mg (Start on 05/07/2023 10:00 AM)   Other Relevant Orders   CT HEAD WO CONTRAST ( )       Meds ordered this encounter  Medications   ketorolac (TORADOL) 30 MG/ML injection 30 mg    No follow-ups on file.  Miguel Aschoff, MD

## 2023-05-07 NOTE — Patient Instructions (Signed)
Ms. Renfrew,  I am sorry you are experiencing this headache.  We are committed to helping you feel better and determining a cause for your symptoms.  We will proceed with the CT scan and go from there.  If today's Toradol injection is helpful for you, it will help guide what you may take at home while you are recovering.  I will call you with your test results.  In the meantime, please try to rest.  Medications such as Tylenol, Advil, Aleve can be helpful.  Take care and I will talk to you soon, Dr. Mayford Knife

## 2023-05-08 ENCOUNTER — Telehealth: Payer: Self-pay | Admitting: Internal Medicine

## 2023-05-08 NOTE — Telephone Encounter (Signed)
Reached Angela Manning by phone at 1:30p to check on her symptoms (improved) and review findings on CT; no sinus fluid, no tumors, nothing out of he ordinary, though comment made about the low-lying cerebellar tonsils which are probably not related given her headache has not been occipital and she is improving. She has been drinking adequate fluids and has no positional lightheadedness.  Taking OTC analgesic - once this morning. Advised her to reach out if she isn't experiencing ongoing improvement or has any concerns over the next days to weeks.  She voiced understanding.

## 2023-06-24 ENCOUNTER — Inpatient Hospital Stay (HOSPITAL_COMMUNITY): Admission: RE | Admit: 2023-06-24 | Payer: Medicaid Other | Source: Ambulatory Visit

## 2023-06-25 ENCOUNTER — Ambulatory Visit (HOSPITAL_COMMUNITY)
Admission: EM | Admit: 2023-06-25 | Discharge: 2023-06-25 | Disposition: A | Discharge status: Home / Self Care | Payer: Medicaid Other | Attending: Physician Assistant | Admitting: Physician Assistant

## 2023-06-25 ENCOUNTER — Encounter (HOSPITAL_COMMUNITY): Payer: Self-pay

## 2023-06-25 VITALS — BP 158/98 | HR 92 | Temp 98.3°F | Resp 16

## 2023-06-25 DIAGNOSIS — I10 Essential (primary) hypertension: Secondary | ICD-10-CM

## 2023-06-25 DIAGNOSIS — N760 Acute vaginitis: Secondary | ICD-10-CM | POA: Diagnosis present

## 2023-06-25 DIAGNOSIS — Z113 Encounter for screening for infections with a predominantly sexual mode of transmission: Secondary | ICD-10-CM | POA: Diagnosis not present

## 2023-06-25 DIAGNOSIS — B9689 Other specified bacterial agents as the cause of diseases classified elsewhere: Secondary | ICD-10-CM | POA: Diagnosis present

## 2023-06-25 DIAGNOSIS — N898 Other specified noninflammatory disorders of vagina: Secondary | ICD-10-CM

## 2023-06-25 DIAGNOSIS — R300 Dysuria: Secondary | ICD-10-CM | POA: Insufficient documentation

## 2023-06-25 LAB — BASIC METABOLIC PANEL
Anion gap: 7 (ref 5–15)
BUN: 9 mg/dL (ref 6–20)
CO2: 24 mmol/L (ref 22–32)
Calcium: 9.3 mg/dL (ref 8.9–10.3)
Chloride: 104 mmol/L (ref 98–111)
Creatinine, Ser: 0.85 mg/dL (ref 0.44–1.00)
GFR, Estimated: >60 mL/min (ref >60)
Glucose, Bld: 76 mg/dL (ref 70–99)
Potassium: 4.6 mmol/L (ref 3.5–5.1)
Sodium: 135 mmol/L (ref 135–145)

## 2023-06-25 LAB — POCT URINALYSIS DIP (MANUAL ENTRY)
Bilirubin, UA: NEGATIVE
Glucose, UA: NEGATIVE mg/dL
Ketones, POC UA: NEGATIVE mg/dL
Leukocytes, UA: NEGATIVE
Nitrite, UA: NEGATIVE
Protein Ur, POC: NEGATIVE mg/dL
Spec Grav, UA: 1.02 (ref 1.010–1.025)
Urobilinogen, UA: 0.2 E.U./dL
pH, UA: >=9 — AB (ref 5.0–8.0)

## 2023-06-25 LAB — POCT URINE PREGNANCY: Preg Test, Ur: NEGATIVE

## 2023-06-25 LAB — HIV ANTIBODY (ROUTINE TESTING W REFLEX): HIV Screen 4th Generation wRfx: NONREACTIVE

## 2023-06-25 MED ORDER — AMLODIPINE BESYLATE 10 MG PO TABS
10.0000 mg | ORAL_TABLET | Freq: Every day | ORAL | 2 refills | Status: DC
Start: 2023-06-25 — End: 2024-02-05

## 2023-06-25 MED ORDER — CLINDAMYCIN HCL 300 MG PO CAPS
300.0000 mg | ORAL_CAPSULE | Freq: Two times a day (BID) | ORAL | 0 refills | Status: DC
Start: 2023-06-25 — End: 2023-08-15

## 2023-06-25 MED ORDER — HYDROCHLOROTHIAZIDE 12.5 MG PO TABS
12.5000 mg | ORAL_TABLET | Freq: Every day | ORAL | 2 refills | Status: AC
Start: 2023-06-25 — End: ?

## 2023-06-25 NOTE — ED Triage Notes (Signed)
Pt is here for sti-testing. Pt has a history of bacterial vaginosis.

## 2023-06-25 NOTE — ED Provider Notes (Signed)
[facility]    CSN: [id number] Arrival date & time: 06/25/23  1346      History   Chief Complaint Chief Complaint  Patient presents with   Vaginal Discharge    HPI Angela Manning is a 37 y.o. female.   Patient presents today requesting STI testing.  She reports for the past several days she has had vaginal discharge and odor.  She has a history of recurrent bacterial vaginosis with similar symptoms and is requesting treatment if appropriate.  Denies any changes to personal hygiene products including soaps or detergents.  She was last treated March 2024.  She is sexually active with female partners but denies any specific exposure to STI.  She is interested in complete STI panel.  Denies any abdominal pain, pelvic pain, fever, nausea, vomiting.  She does report some urinary frequency and dysuria but is unsure if this is related to her vaginal symptoms or UTI.  Denies any recent vaginal procedure, self-catheterization.  Denies history of nephrolithiasis.  Her blood pressure is elevated.  She does have a history of hypertension currently managed with hydrochlorothiazide and amlodipine.  Reports that she is running low on her medication and did not take it this morning.  She denies any headache, dizziness, chest pain, shortness of breath, visual disturbance.  She is requesting a refill of these medications if appropriate.  Denies any increased sodium consumption, caffeine, NSAIDs, decongestants.    Past Medical History:  Diagnosis Date   Bacterial vaginosis    Hypertension    Hypertension affecting pregnancy, antepartum 10/27/2019   Ovarian cyst    Pregnancy induced hypertension     Patient Active Problem List   Diagnosis Date Noted   Abnormal Pap smear of cervix 10/23/2021   Generalized anxiety disorder 04/19/2021   Essential hypertension 03/03/2020   Ovarian cyst, left 03/01/2015   Menorrhagia with irregular cycle 03/01/2015   Dyspareunia 03/01/2015    Past  Surgical History:  Procedure Laterality Date   DILATION AND EVACUATION N/A 02/21/2013   Procedure: DILATATION AND EVACUATION;  Surgeon: Reva Bores, MD;  Location: [facility];  Service: Gynecology;  Laterality: N/A;    OB History     Gravida  5   Para  4   Term  3   Preterm  1   AB      Living  4      SAB      IAB      Ectopic      Multiple      Live Births  4            Home Medications    Prior to Admission medications   Medication Sig Start Date End Date Taking? Authorizing Provider  amLODipine (NORVASC) 10 MG tablet Take 1 tablet (10 mg total) by mouth daily. 06/25/23   Ayesha Markwell, Noberto Retort, PA-C  clindamycin (CLEOCIN) 300 MG capsule Take 1 capsule (300 mg total) by mouth 2 (two) times daily. 06/25/23   Taydem Cavagnaro, Noberto Retort, PA-C  hydrochlorothiazide (HYDRODIURIL) 12.5 MG tablet Take 1 tablet (12.5 mg total) by mouth daily. 06/25/23   Ashaad Gaertner, Noberto Retort, PA-C  norethindrone (MICRONOR,CAMILA,ERRIN) 0.35 MG tablet Take 1 tablet (0.35 mg total) by mouth daily. Patient not taking: Reported on 08/23/2019 10/07/18 09/29/19  Conan Bowens, MD    Family History Family History  Problem Relation Age of Onset   Healthy Father    Stroke Mother    Hypertension Mother    Valvular heart disease  Mother     Social History Social History   Tobacco Use   Smoking status: Former    Current packs/day: 0.25    Types: Cigarettes   Smokeless tobacco: Former    Quit date: 01/08/2015   Tobacco comments:    quit 3 years ago   Vaping Use   Vaping status: Never Used  Substance Use Topics   Alcohol use: Not Currently   Drug use: No     Allergies   Flagyl [metronidazole]   Review of Systems Review of Systems  Constitutional:  Positive for activity change. Negative for appetite change, fatigue and fever.  Eyes:  Negative for visual disturbance.  Respiratory:  Negative for cough and shortness of breath.   Cardiovascular:  Negative for chest pain and palpitations.  Gastrointestinal:   Negative for abdominal pain, diarrhea, nausea and vomiting.  Genitourinary:  Positive for dysuria, frequency and vaginal discharge. Negative for genital sores, hematuria, urgency, vaginal bleeding and vaginal pain.  Neurological:  Negative for dizziness, light-headedness and headaches.     Physical Exam Triage Vital Signs ED Triage Vitals [06/25/23 1404]  Encounter Vitals Group     BP (!) 157/103     Systolic BP Percentile      Diastolic BP Percentile      Pulse Rate 92     Resp 16     Temp 98.3 F (36.8 C)     Temp Source Oral     SpO2 98 %     Weight      Height      Head Circumference      Peak Flow      Pain Score      Pain Loc      Pain Education      Exclude from Growth Chart    No data found.  Updated Vital Signs BP (!) 158/98 (BP Location: Left Arm)   Pulse 92   Temp 98.3 F (36.8 C) (Oral)   Resp 16   LMP 06/02/2023   SpO2 98%   Visual Acuity Right Eye Distance:   Left Eye Distance:   Bilateral Distance:    Right Eye Near:   Left Eye Near:    Bilateral Near:     Physical Exam Vitals reviewed.  Constitutional:      General: She is awake. She is not in acute distress.    Appearance: Normal appearance. She is well-developed. She is not ill-appearing.     Comments: Very pleasant female appears stated age in no acute distress sitting comfortably in exam room  HENT:     Head: Normocephalic and atraumatic.  Cardiovascular:     Rate and Rhythm: Normal rate and regular rhythm.     Heart sounds: Normal heart sounds, S1 normal and S2 normal. No murmur heard. Pulmonary:     Effort: Pulmonary effort is normal.     Breath sounds: Normal breath sounds. No wheezing, rhonchi or rales.     Comments: Clear to auscultation bilaterally Abdominal:     General: Bowel sounds are normal.     Palpations: Abdomen is soft.     Tenderness: There is no abdominal tenderness. There is no right CVA tenderness, left CVA tenderness, guarding or rebound.  Musculoskeletal:      Right lower leg: No edema.     Left lower leg: No edema.  Psychiatric:        Behavior: Behavior is cooperative.      UC Treatments / Results  Labs (all labs  ordered are listed, but only abnormal results are displayed) Labs Reviewed  POCT URINALYSIS DIP (MANUAL ENTRY) - Abnormal; Notable for the following components:      Result Value   Blood, UA trace-intact (*)    pH, UA >=9.0 (*)    All other components within normal limits  BASIC METABOLIC PANEL  HIV ANTIBODY (ROUTINE TESTING W REFLEX)  RPR  POCT URINE PREGNANCY  CERVICOVAGINAL ANCILLARY ONLY    EKG   Radiology No results found.  Procedures Procedures (including critical care time)  Medications Ordered in UC Medications - No data to display  Initial Impression / Assessment and Plan / UC Course  I have reviewed the triage vital signs and the nursing notes.  Pertinent labs & imaging results that were available during my care of the patient were reviewed by me and considered in my medical decision making (see chart for details).     Patient is well-appearing, afebrile, nontoxic, nontachycardic.  Concern for bacterial vaginosis given clinical presentation.  Given her allergy to metronidazole will treat with clindamycin twice daily for 7 days.  STI swab was collected and is pending.  She did request HIV and syphilis testing as well which are also pending.  She is to abstain from sex until results are available.  We discussed the importance of safe sex practices.  She is to wear loosefitting cotton underwear and use hypoallergenic soaps and detergents.  If her symptoms or not improving or if anything worsens or she develops fever, abdominal pain, pelvic pain she needs to be seen immediately.  Strict return precautions given.  Blood pressure is elevated.  She reports she has not taken her medication today.  She was encouraged to go home and take her medicine we discussed the importance of regular use of this medication.  She  is to avoid decongestants, caffeine, sodium, NSAIDs.  Recommend she monitor her blood pressure at home and keep log for evaluation at follow-up appointment with her primary care.  BMP was obtained and is pending today.  We will contact her if we need to change her medication regimen based on these results.  Discussed that if she has any headache, dizziness, chest pain, shortness of breath, visual disturbance she needs to be seen immediately.  Strict return precautions given.  Final Clinical Impressions(s) / UC Diagnoses   Final diagnoses:  Vaginal discharge  Vaginal odor  Elevated blood pressure reading in office with diagnosis of hypertension  Screening examination for STI  Dysuria     Discharge Instructions      We are treating you for bacterial vaginosis.  Take clindamycin twice daily for 7 days.  Wear loosefitting cotton underwear and use hypoallergenic soaps and detergents.  We will contact you if we need to arrange additional treatment based on your swab results.  Abstain from sex until you receive results.  Use a condom with each sexual encounter.  If you develop any pelvic pain, abdominal pain, fever, nausea, vomiting you should be seen immediately.  Your blood pressure is elevated today.  Please go home and take your medication.  I have called in refills of your medication to the pharmacy.  Avoid decongestants, caffeine, sodium, NSAIDs (aspirin, ibuprofen/Advil, naproxen/Aleve).  Follow-up with your primary care.  If you develop any chest pain, shortness of breath, headache, vision change, dizziness in setting of high blood pressure you need to be seen immediately.     ED Prescriptions     Medication Sig Dispense Auth. Provider   amLODipine (  NORVASC) 10 MG tablet Take 1 tablet (10 mg total) by mouth daily. 30 tablet Katlyne Nishida K, PA-C   hydrochlorothiazide (HYDRODIURIL) 12.5 MG tablet Take 1 tablet (12.5 mg total) by mouth daily. 30 tablet Cordaryl Decelles K, PA-C   clindamycin  (CLEOCIN) 300 MG capsule Take 1 capsule (300 mg total) by mouth 2 (two) times daily. 14 capsule Shemeka Wardle K, PA-C      PDMP not reviewed this encounter.   Jeani Hawking, PA-C 06/25/23 1517

## 2023-06-25 NOTE — Discharge Instructions (Addendum)
We are treating you for bacterial vaginosis.  Take clindamycin twice daily for 7 days.  Wear loosefitting cotton underwear and use hypoallergenic soaps and detergents.  We will contact you if we need to arrange additional treatment based on your swab results.  Abstain from sex until you receive results.  Use a condom with each sexual encounter.  If you develop any pelvic pain, abdominal pain, fever, nausea, vomiting you should be seen immediately.  Your blood pressure is elevated today.  Please go home and take your medication.  I have called in refills of your medication to the pharmacy.  Avoid decongestants, caffeine, sodium, NSAIDs (aspirin, ibuprofen/Advil, naproxen/Aleve).  Follow-up with your primary care.  If you develop any chest pain, shortness of breath, headache, vision change, dizziness in setting of high blood pressure you need to be seen immediately.

## 2023-06-26 LAB — CERVICOVAGINAL ANCILLARY ONLY
Bacterial Vaginitis (gardnerella): POSITIVE — AB
Candida Glabrata: NEGATIVE
Candida Vaginitis: NEGATIVE
Chlamydia: NEGATIVE
Comment: NEGATIVE
Comment: NEGATIVE
Comment: NEGATIVE
Comment: NEGATIVE
Comment: NEGATIVE
Comment: NORMAL
Neisseria Gonorrhea: NEGATIVE
Trichomonas: NEGATIVE

## 2023-06-26 LAB — RPR
RPR Ser Ql: REACTIVE — AB
RPR Titer: 1:2 {titer}

## 2023-06-27 LAB — T.PALLIDUM AB, TOTAL: T Pallidum Abs: REACTIVE — AB

## 2023-07-01 ENCOUNTER — Encounter (HOSPITAL_COMMUNITY): Payer: Self-pay | Admitting: Emergency Medicine

## 2023-07-01 ENCOUNTER — Other Ambulatory Visit: Payer: Self-pay

## 2023-07-01 ENCOUNTER — Telehealth (HOSPITAL_COMMUNITY): Payer: Self-pay | Admitting: Emergency Medicine

## 2023-07-01 ENCOUNTER — Ambulatory Visit (HOSPITAL_COMMUNITY)
Admission: EM | Admit: 2023-07-01 | Discharge: 2023-07-01 | Disposition: A | Discharge status: Home / Self Care | Payer: Medicaid Other | Attending: Emergency Medicine | Admitting: Emergency Medicine

## 2023-07-01 DIAGNOSIS — A549 Gonococcal infection, unspecified: Secondary | ICD-10-CM | POA: Diagnosis not present

## 2023-07-01 MED ORDER — PENICILLIN G BENZATHINE 1200000 UNIT/2ML IM SUSY
PREFILLED_SYRINGE | INTRAMUSCULAR | Status: AC
  Filled 2023-07-01: qty 4

## 2023-07-01 MED ORDER — PENICILLIN G BENZATHINE 1200000 UNIT/2ML IM SUSY
2.4000 10*6.[IU] | PREFILLED_SYRINGE | Freq: Once | INTRAMUSCULAR | Status: AC
  Administered 2023-07-01: 2.4 10*6.[IU] via INTRAMUSCULAR

## 2023-07-01 NOTE — Telephone Encounter (Signed)
Contacted patient by phone.  Verified identity using two identifiers.  Provided positive result.  Reviewed safe sex practices, notifying partners, and refraining from sexual activities for 7 days from time of treatment.  Patient verified understanding, all questions answered.   She denies hx of syphilis or treatment.   Per protocol, patient will need treatment with IM Bicillin 2.4 million units for positive Syphilis.

## 2023-07-01 NOTE — ED Triage Notes (Signed)
Seen 06/25/2023 for STI testing.  Patient was called about positive syphilis test result.  Patient is in department for treatment

## 2023-07-01 NOTE — Discharge Instructions (Signed)
Follow up as needed.  No sexual intercourse for 7 days

## 2023-07-02 ENCOUNTER — Encounter (HOSPITAL_BASED_OUTPATIENT_CLINIC_OR_DEPARTMENT_OTHER): Payer: Self-pay

## 2023-07-02 ENCOUNTER — Institutional Professional Consult (permissible substitution) (HOSPITAL_BASED_OUTPATIENT_CLINIC_OR_DEPARTMENT_OTHER): Payer: Medicaid Other | Admitting: Cardiovascular Disease

## 2023-07-21 DIAGNOSIS — A530 Latent syphilis, unspecified as early or late: Secondary | ICD-10-CM | POA: Diagnosis not present

## 2023-07-21 DIAGNOSIS — B3731 Acute candidiasis of vulva and vagina: Secondary | ICD-10-CM | POA: Diagnosis not present

## 2023-07-21 DIAGNOSIS — Z113 Encounter for screening for infections with a predominantly sexual mode of transmission: Secondary | ICD-10-CM | POA: Diagnosis not present

## 2023-07-21 DIAGNOSIS — Z114 Encounter for screening for human immunodeficiency virus [HIV]: Secondary | ICD-10-CM | POA: Diagnosis not present

## 2023-07-29 DIAGNOSIS — A530 Latent syphilis, unspecified as early or late: Secondary | ICD-10-CM | POA: Diagnosis not present

## 2023-08-05 DIAGNOSIS — Z114 Encounter for screening for human immunodeficiency virus [HIV]: Secondary | ICD-10-CM | POA: Diagnosis not present

## 2023-08-05 DIAGNOSIS — Z113 Encounter for screening for infections with a predominantly sexual mode of transmission: Secondary | ICD-10-CM | POA: Diagnosis not present

## 2023-08-05 DIAGNOSIS — B3731 Acute candidiasis of vulva and vagina: Secondary | ICD-10-CM | POA: Diagnosis not present

## 2023-08-05 DIAGNOSIS — A530 Latent syphilis, unspecified as early or late: Secondary | ICD-10-CM | POA: Diagnosis not present

## 2023-08-12 ENCOUNTER — Ambulatory Visit (HOSPITAL_COMMUNITY): Payer: Medicaid Other

## 2023-08-15 ENCOUNTER — Ambulatory Visit (HOSPITAL_COMMUNITY)
Admission: RE | Admit: 2023-08-15 | Discharge: 2023-08-15 | Disposition: A | Discharge status: Home / Self Care | Payer: Medicaid Other | Source: Ambulatory Visit | Attending: Family Medicine | Admitting: Family Medicine

## 2023-08-15 ENCOUNTER — Encounter (HOSPITAL_COMMUNITY): Payer: Self-pay

## 2023-08-15 VITALS — BP 150/105 | HR 66 | Temp 98.6°F | Resp 17

## 2023-08-15 DIAGNOSIS — B9689 Other specified bacterial agents as the cause of diseases classified elsewhere: Secondary | ICD-10-CM | POA: Diagnosis not present

## 2023-08-15 DIAGNOSIS — N761 Subacute and chronic vaginitis: Secondary | ICD-10-CM | POA: Insufficient documentation

## 2023-08-15 DIAGNOSIS — N760 Acute vaginitis: Secondary | ICD-10-CM | POA: Diagnosis not present

## 2023-08-15 LAB — POCT URINALYSIS DIP (MANUAL ENTRY)
Bilirubin, UA: NEGATIVE
Blood, UA: NEGATIVE
Glucose, UA: NEGATIVE mg/dL
Ketones, POC UA: NEGATIVE mg/dL
Leukocytes, UA: NEGATIVE
Nitrite, UA: NEGATIVE
Protein Ur, POC: NEGATIVE mg/dL
Spec Grav, UA: 1.02 (ref 1.010–1.025)
Urobilinogen, UA: 0.2 U/dL
pH, UA: 7 (ref 5.0–8.0)

## 2023-08-15 LAB — POCT URINE PREGNANCY: Preg Test, Ur: NEGATIVE

## 2023-08-15 MED ORDER — CLINDAMYCIN HCL 300 MG PO CAPS: 300.0000 mg | ORAL_CAPSULE | Freq: Two times a day (BID) | ORAL | 0 refills | Status: DC

## 2023-08-15 NOTE — ED Triage Notes (Signed)
Pt reports has hx recurrent BV. Reports having vaginal discharge and odor for 3-4 days.   Pt is out of her HTN medications for a couple days. Would like refill til can get in with PCP

## 2023-08-16 NOTE — ED Provider Notes (Signed)
[facility]    CSN: [id number] Arrival date & time: 08/15/23  1306      History   Chief Complaint Chief Complaint  Patient presents with   Abdominal Pain    Entered by patient   Vaginal Discharge    HPI Angela Manning is a 37 y.o. female.   Patient here with a concern for recurrent BV. Patient was seen in July and treatment for BV and cytology confirmed the presence of BV. Current symptoms of abnormal vaginal odor present for a total of 3-4 days.  Patient also STD testing to rule out this is the source of her symptoms.  She reports having some intermittent lower abdominal discomfort without  nausea vomiting or diarrhea. Patient also suffers from hypertension and was requesting a refill of blood pressure medication.  On chart review patient has a refill remaining at the pharmacy for hydrochlorothiazide and is scheduled to see cardiology on 09/14/2023.  Past Medical History:  Diagnosis Date   Bacterial vaginosis    Hypertension    Hypertension affecting pregnancy, antepartum 10/27/2019   Ovarian cyst    Pregnancy induced hypertension     Patient Active Problem List   Diagnosis Date Noted   Abnormal Pap smear of cervix 10/23/2021   Generalized anxiety disorder 04/19/2021   Essential hypertension 03/03/2020   Ovarian cyst, left 03/01/2015   Menorrhagia with irregular cycle 03/01/2015   Dyspareunia 03/01/2015    Past Surgical History:  Procedure Laterality Date   DILATION AND EVACUATION N/A 02/21/2013   Procedure: DILATATION AND EVACUATION;  Surgeon: Reva Bores, MD;  Location: [facility];  Service: Gynecology;  Laterality: N/A;    OB History     Gravida  5   Para  4   Term  3   Preterm  1   AB      Living  4      SAB      IAB      Ectopic      Multiple      Live Births  4            Home Medications    Prior to Admission medications   Medication Sig Start Date End Date Taking? Authorizing Provider  amLODipine (NORVASC) 10 MG tablet  Take 1 tablet (10 mg total) by mouth daily. 06/25/23   Raspet, Noberto Retort, PA-C  clindamycin (CLEOCIN) 300 MG capsule Take 1 capsule (300 mg total) by mouth 2 (two) times daily. 08/15/23   Bing Neighbors, NP  hydrochlorothiazide (HYDRODIURIL) 12.5 MG tablet Take 1 tablet (12.5 mg total) by mouth daily. 06/25/23   Raspet, Noberto Retort, PA-C  norethindrone (MICRONOR,CAMILA,ERRIN) 0.35 MG tablet Take 1 tablet (0.35 mg total) by mouth daily. Patient not taking: Reported on 08/23/2019 10/07/18 09/29/19  Conan Bowens, MD    Family History Family History  Problem Relation Age of Onset   Healthy Father    Stroke Mother    Hypertension Mother    Valvular heart disease Mother     Social History Social History   Tobacco Use   Smoking status: Former    Current packs/day: 0.25    Types: Cigarettes   Smokeless tobacco: Former    Quit date: 01/08/2015   Tobacco comments:    quit 3 years ago   Vaping Use   Vaping status: Never Used  Substance Use Topics   Alcohol use: Not Currently   Drug use: No     Allergies   Flagyl [  metronidazole]   Review of Systems Review of Systems  Genitourinary:  Positive for vaginal discharge.     Physical Exam Triage Vital Signs ED Triage Vitals [08/15/23 1326]  Encounter Vitals Group     BP (!) 150/105     Systolic BP Percentile      Diastolic BP Percentile      Pulse Rate 66     Resp 17     Temp 98.6 F (37 C)     Temp Source Oral     SpO2 98 %     Weight      Height      Head Circumference      Peak Flow      Pain Score      Pain Loc      Pain Education      Exclude from Growth Chart    No data found.  Updated Vital Signs BP (!) 150/105 (BP Location: Left Arm)   Pulse 66   Temp 98.6 F (37 C) (Oral)   Resp 17   LMP 07/30/2023   SpO2 98%   Visual Acuity Right Eye Distance:   Left Eye Distance:   Bilateral Distance:    Right Eye Near:   Left Eye Near:    Bilateral Near:     Physical Exam Constitutional:      Appearance: She  is well-developed.  HENT:     Head: Normocephalic.  Cardiovascular:     Rate and Rhythm: Normal rate and regular rhythm.  Pulmonary:     Effort: Pulmonary effort is normal.     Breath sounds: Normal breath sounds.  Abdominal:     General: Abdomen is scaphoid. Bowel sounds are normal.     Tenderness: There is no abdominal tenderness.     Hernia: No hernia is present.  Skin:    General: Skin is warm.     Capillary Refill: Capillary refill takes less than 2 seconds.  Neurological:     General: No focal deficit present.     Mental Status: She is alert.      UC Treatments / Results  Labs (all labs ordered are listed, but only abnormal results are displayed) Labs Reviewed  POCT URINALYSIS DIP (MANUAL ENTRY)  POCT URINE PREGNANCY  CERVICOVAGINAL ANCILLARY ONLY    EKG   Radiology No results found.  Procedures Procedures (including critical care time)  Medications Ordered in UC Medications - No data to display  Initial Impression / Assessment and Plan / UC Course  I have reviewed the triage vital signs and the nursing notes.  Pertinent labs & imaging results that were available during my care of the patient were reviewed by me and considered in my medical decision making (see chart for details).    Vaginal cytology pending. Will treat for BV given on chart review patient has a history of recurrent BV. On chart review, pt has BP medication with refills and is scheduled to see Cardiology in 30 days. Pt made aware of this. Abdominal pain, intermittent, no exam findings today concerning for acute abdomen, watch and wait. Return precaution given. Final Clinical Impressions(s) / UC Diagnoses   Final diagnoses:  Chronic vaginitis   Discharge Instructions   None    ED Prescriptions     Medication Sig Dispense Auth. Provider   clindamycin (CLEOCIN) 300 MG capsule Take 1 capsule (300 mg total) by mouth 2 (two) times daily. 14 capsule Bing Neighbors, NP      PDMP  not reviewed this encounter.   Bing Neighbors, NP 08/18/23 [phone]

## 2023-08-18 LAB — CERVICOVAGINAL ANCILLARY ONLY
Bacterial Vaginitis (gardnerella): POSITIVE — AB
Candida Glabrata: POSITIVE — AB
Candida Vaginitis: NEGATIVE
Chlamydia: NEGATIVE
Comment: NEGATIVE
Comment: NEGATIVE
Comment: NEGATIVE
Comment: NEGATIVE
Comment: NEGATIVE
Comment: NORMAL
Neisseria Gonorrhea: NEGATIVE
Trichomonas: NEGATIVE

## 2023-09-10 ENCOUNTER — Other Ambulatory Visit (HOSPITAL_BASED_OUTPATIENT_CLINIC_OR_DEPARTMENT_OTHER): Payer: Self-pay

## 2023-09-10 ENCOUNTER — Encounter (HOSPITAL_BASED_OUTPATIENT_CLINIC_OR_DEPARTMENT_OTHER): Payer: Self-pay | Admitting: Cardiovascular Disease

## 2023-09-10 ENCOUNTER — Ambulatory Visit (HOSPITAL_BASED_OUTPATIENT_CLINIC_OR_DEPARTMENT_OTHER): Payer: Medicaid Other | Admitting: Cardiovascular Disease

## 2023-09-10 VITALS — BP 146/96 | HR 74 | Ht 66.0 in | Wt 161.3 lb

## 2023-09-10 DIAGNOSIS — Z5181 Encounter for therapeutic drug level monitoring: Secondary | ICD-10-CM | POA: Diagnosis not present

## 2023-09-10 DIAGNOSIS — O1490 Unspecified pre-eclampsia, unspecified trimester: Secondary | ICD-10-CM

## 2023-09-10 DIAGNOSIS — Z8759 Personal history of other complications of pregnancy, childbirth and the puerperium: Secondary | ICD-10-CM | POA: Diagnosis not present

## 2023-09-10 DIAGNOSIS — I10 Essential (primary) hypertension: Secondary | ICD-10-CM | POA: Diagnosis not present

## 2023-09-10 HISTORY — DX: Unspecified pre-eclampsia, unspecified trimester: O14.90

## 2023-09-10 MED ORDER — SPIRONOLACTONE 25 MG PO TABS
25.0000 mg | ORAL_TABLET | Freq: Every day | ORAL | 3 refills | Status: DC
  Filled 2023-09-10 (×2): qty 90, 90d supply, fill #0

## 2023-09-10 NOTE — Progress Notes (Signed)
[facility] Initial Assessment:    Date:  09/10/2023   ID:  Angela Manning, DOB 13-Jul-1986, MRN [id number]  PCP:  Reymundo Poll, MD  Cardiologist:  None  Nephrologist:  Referring MD: Lorriane Shire, MD   CC: Hypertension  History of Present Illness:    Angela Manning is a 37 y.o. female with a hx of gestational hypertension, pre-eclampsia, here to establish care in the [facility] at the request of Dr. Lorriane Shire. She was seen by Dr. Briscoe Deutscher 02/25/2023 and her blood pressure was 179/125. It was noted that she had run out of her medications. Her 10 mg amlodipine and 12.5 mg HCTZ meds were refilled and she was referred to the Advanced Hypertension clinic. On 05/07/2023 she saw Dr. Mayford Knife and her blood pressure was 123/92 in the setting of a headache lasting 3 weeks.  Previously seen by Dr. Mcarthur Rossetti 10/08/2021 and noted to have a history of gestational hypertension since age 47, treated with antihypertensives since then. Renin/aldosterone levels were within normal limits (as of 04/2021). Home blood pressures were usually in the 140s/90s. She was continued on HCTZ and amlodipine was increased to 10 mg daily at that time.  Today, she confirms having hypertension since she was 16, more recently difficult to control. In the office her blood pressure is 156/111 initially, and 146/96 on manual recheck. Most of the time she remembers to take her medications. She notes that she only took the amlodipine earlier today. Of note, she confirms that her last pregnancy about 11 years ago was induced due to preeclampsia. Additionally she complains of intermittent left chest pressure, especially noticeable at night. This chest pressure never occurs with walking upstairs and she denies any shortness of breath. Regarding her diet she typically cooks her meals at home. Every day she drinks 1 cup of coffee. She avoids sodas and doesn't drink alcohol. For pain management she  has been taking Tylenol PM and Excedrin lately for complaints of migraines localized to the left frontal region and behind her left eye. She recognizes that she needs to increase her formal exercise. She is busy working at a Airline pilot, studying at [employer], and caring for her children. She states that she is always feeling fatigued and somnolent. Overall she denies snoring unless she is significantly fatigued. She denies any palpitations, peripheral edema, lightheadedness, syncope, orthopnea, or PND.  Previous antihypertensives: N/a  Past Medical History:  Diagnosis Date   Bacterial vaginosis    Hypertension    Hypertension affecting pregnancy, antepartum 10/27/2019   Ovarian cyst    Preeclampsia 09/10/2023   Pregnancy induced hypertension    Primary hypertension 03/03/2020    Past Surgical History:  Procedure Laterality Date   DILATION AND EVACUATION N/A 02/21/2013   Procedure: DILATATION AND EVACUATION;  Surgeon: Reva Bores, MD;  Location: [facility];  Service: Gynecology;  Laterality: N/A;    Current Medications: Current Meds  Medication Sig   amLODipine (NORVASC) 10 MG tablet Take 1 tablet (10 mg total) by mouth daily.   spironolactone (ALDACTONE) 25 MG tablet Take 1 tablet (25 mg total) by mouth daily.   [DISCONTINUED] hydrochlorothiazide (HYDRODIURIL) 12.5 MG tablet Take 1 tablet (12.5 mg total) by mouth daily.     Allergies:   Flagyl [metronidazole]   Social History   Socioeconomic History   Marital status: Single    Spouse name: Not on file   Number of children: Not on file   Years of education: Not on file  Highest education level: Not on file  Occupational History   Not on file  Tobacco Use   Smoking status: Former    Current packs/day: 0.25    Types: Cigarettes   Smokeless tobacco: Former    Quit date: 01/08/2015   Tobacco comments:    quit 3 years ago   Vaping Use   Vaping status: Never Used  Substance and Sexual Activity   Alcohol use: Not Currently    Drug use: No   Sexual activity: Yes    Partners: Male    Birth control/protection: None  Other Topics Concern   Not on file  Social History Narrative   Not on file   Social Determinants of Health   Financial Resource Strain: Medium Risk (05/07/2023)   Overall Financial Resource Strain (CARDIA)    Difficulty of Paying Living Expenses: Somewhat hard  Food Insecurity: No Food Insecurity (05/07/2023)   Hunger Vital Sign    Worried About Running Out of Food in the Last Year: Never true    Ran Out of Food in the Last Year: Never true  Transportation Needs: No Transportation Needs (05/07/2023)   PRAPARE - Administrator, Civil Service (Medical): No    Lack of Transportation (Non-Medical): No  Physical Activity: Inactive (09/10/2023)   Exercise Vital Sign    Days of Exercise per Week: 0 days    Minutes of Exercise per Session: 0 min  Stress: Not on file  Social Connections: Moderately Integrated (05/07/2023)   Social Connection and Isolation Panel [NHANES]    Frequency of Communication with Friends and Family: More than three times a week    Frequency of Social Gatherings with Friends and Family: More than three times a week    Attends Religious Services: More than 4 times per year    Active Member of [employer] or Organizations: No    Attends Engineer, structural: 1 to 4 times per year    Marital Status: Never married     Family History: The patient's family history includes Healthy in her father; Hypertension in her maternal grandmother, mother, and paternal grandmother; Stroke in her mother; Valvular heart disease in her mother.  ROS:   Please see the history of present illness.    (+) Intermittent chest pressure (+) Fatigue (+) Daytime somnolence (+) Migraine headaches (+) Urinary frequency All other systems reviewed and are negative.  EKGs/Labs/Other Studies Reviewed:    EKG:  EKG is personally reviewed. 09/10/2023: Sinus rhythm. Rate 74 bpm. LVH.  Recent  Labs: 01/28/2023: ALT 10; Hemoglobin 11.3; Platelets 230 06/25/2023: BUN 9; Creatinine, Ser 0.85; Potassium 4.6; Sodium 135   Recent Lipid Panel No results found for: "CHOL", "TRIG", "HDL", "CHOLHDL", "VLDL", "LDLCALC", "LDLDIRECT"  Physical Exam:    VS:  BP (!) 146/96 (BP Location: Right Arm, Patient Position: Sitting, Cuff Size: Normal)   Pulse 74   Ht 5\' 6"  (1.676 m)   Wt 161 lb 4.8 oz (73.2 kg)   LMP 07/30/2023   SpO2 96%   BMI 26.03 kg/m  , BMI Body mass index is 26.03 kg/m. GENERAL:  Well appearing HEENT: Pupils equal round and reactive, fundi not visualized, oral mucosa unremarkable NECK:  No jugular venous distention, waveform within normal limits, carotid upstroke brisk and symmetric, no bruits, no thyromegaly LUNGS:  Clear to auscultation bilaterally HEART:  RRR.  PMI not displaced or sustained, S1 and S2 within normal limits, no S3, no S4, no clicks, no rubs, no murmurs ABD:  Flat, positive  bowel sounds normal in frequency in pitch, no bruits, no rebound, no guarding, no midline pulsatile mass, no hepatomegaly, no splenomegaly EXT:  2 plus pulses throughout, no edema, no cyanosis, no clubbing SKIN:  No rashes, no nodules NEURO:  Cranial nerves II through XII grossly intact, motor grossly intact throughout PSYCH:  Cognitively intact, oriented to person place and time   ASSESSMENT/PLAN:    # Hypertension Longstanding, poorly controlled hypertension.  Family history of hypertension and heart failure. No home blood pressure monitoring. Sedentary lifestyle and high sodium diet. -Discontinue Hydrochlorothiazide due to potential contribution to low potassium and frequent urination.  -Start Spironolactone 25mg  daily. -Continue Amlodipine. -Check basic metabolic panel in 1 week to assess potassium and kidney function. -Encourage home blood pressure monitoring and provide education on proper technique. -Encourage dietary modifications, including reduced sodium intake and provide  resources for healthy cooking and seasoning alternatives. -Encourage increased physical activity as able. -She declined participation in remote patient follow-up study. -Track BP twice daily and bring to follow up.    # Chest pain:  Non-exertional chest pain when lying down.  Never with exertion.  We discussed that this is likely GERD/esophageal spasm or anxiety.  She will monitor for ischemic symptoms, but not further evaluation warranted at this time.  # Migraines Recent onset of migraines, potentially related to high blood pressure or prolonged screen time. Partial relief with Excedrin Migraine. -Continue Excedrin Migraine as needed. -Consider further evaluation if migraines persist or worsen. -HTN management as above.  General Health Maintenance -Encourage participation in available nutrition classes. -Follow-up in 1 month for medication adjustment, with goal of improved blood pressure control by 64-month follow-up.        Screening for Secondary Hypertension:     09/10/2023    1:55 PM  Causes  Drugs/Herbals Screened     - Comments high salt.  1 coffee daily.  No NSAIDS.  Sleep Apnea Screened     - Comments no symtpoms  Thyroid Disease Screened  Hyperaldosteronism Screened     - Comments normal  Pheochromocytoma N/A  Cushing's Syndrome N/A  Hyperparathyroidism N/A  Coarctation of the Aorta Screened     - Comments BP symmetric  Compliance Screened     - Comments strugles at times.    Relevant Labs/Studies:    Latest Ref Rng & Units 06/25/2023    4:43 PM 01/28/2023    7:40 PM 10/09/2021   10:03 PM  Basic Labs  Sodium 135 - 145 mmol/L 135  137  133   Potassium 3.5 - 5.1 mmol/L 4.6  3.4  3.3   Creatinine 0.44 - 1.00 mg/dL 1.61  0.96  0.45        Latest Ref Rng & Units 09/25/2021    4:33 PM 10/07/2018   11:17 AM  Thyroid   TSH 0.450 - 4.500 uIU/mL 0.808  0.403        Latest Ref Rng & Units 04/19/2021   11:12 AM  Renin/Aldosterone   Aldosterone 0.0 - 30.0  ng/dL 5.0   Renin 4.098 - 1.191 ng/mL/hr 1.187   Aldos/Renin Ratio 0.0 - 30.0 4.2              09/10/2023    2:17 PM  Renovascular   Renal Artery [place] Completed Yes     she consents to be monitored in our remote patient monitoring program through [employer].  she will track Disposition:    FU with APP/PharmD in 1 month for the next 3 months.  FU with Janelle Spellman C. Duke Salvia, MD, [facility] in 4 months.  Medication Adjustments/Labs and Tests Ordered: Current medicines are reviewed at length with the patient today.  Concerns regarding medicines are outlined above.   Orders Placed This Encounter  Procedures   Basic metabolic panel   EKG 12-Lead   VAS US RENAL ARTERY DUPLEX   Meds ordered this encounter  Medications   spironolactone (ALDACTONE) 25 MG tablet    Sig: Take 1 tablet (25 mg total) by mouth daily.    Dispense:  90 tablet    Refill:  3    D/C hydrochlorothiazide   I,Mathew Stumpf,acting as a scribe for Chilton Si, MD.,have documented all relevant documentation on the behalf of Chilton Si, MD,as directed by  Chilton Si, MD while in the presence of Chilton Si, MD.  I, Trenita Hulme C. Duke Salvia, MD have reviewed all documentation for this visit.  The documentation of the exam, diagnosis, procedures, and orders on 09/10/2023 are all accurate and complete.   Signed, Chilton Si, MD  09/10/2023 2:52 PM     [facility]

## 2023-09-10 NOTE — Patient Instructions (Signed)
Medication Instructions:  STOP HYDROCHLOROTHIAZIDE  START SPIRONOLACTONE 25 MG DAILY     Labwork: BMET IN 1 WEEK    Testing/Procedures: Your physician has requested that you have a renal artery duplex. During this test, an ultrasound is used to evaluate blood flow to the kidneys. Allow one hour for this exam. Do not eat after midnight the day before and avoid carbonated beverages. Take your medications as you usually do.   Follow-Up: 1 MONTH WITH PHARM D   4 MONTHS WITH DR [place] IN [facility]V HTN CLINIC   Special Instructions:  MONITOR YOUR BLOOD PRESSURE TWICE A DAY, LOG IN THE BOOK PROVIDED. BRING THE BOOK AND YOUR BLOOD PRESSURE MACHINE TO YOUR FOLLOW UP IN 1 MONTH    DASH Eating Plan DASH stands for "Dietary Approaches to Stop Hypertension." The DASH eating plan is a healthy eating plan that has been shown to reduce high blood pressure (hypertension). It may also reduce your risk for type 2 diabetes, heart disease, and stroke. The DASH eating plan may also help with weight loss. What are tips for following this plan?  General guidelines Avoid eating more than 2,300 mg (milligrams) of salt (sodium) a day. If you have hypertension, you may need to reduce your sodium intake to 1,500 mg a day. Limit alcohol intake to no more than 1 drink a day for nonpregnant women and 2 drinks a day for men. One drink equals 12 oz of beer, 5 oz of wine, or 1 oz of hard liquor. Work with your health care provider to maintain a healthy body weight or to lose weight. Ask what an ideal weight is for you. Get at least 30 minutes of exercise that causes your heart to beat faster (aerobic exercise) most days of the week. Activities may include walking, swimming, or biking. Work with your health care provider or diet and nutrition specialist (dietitian) to adjust your eating plan to your individual calorie needs. Reading food labels  Check food labels for the amount of sodium per serving. Choose foods with less  than 5 percent of the Daily Value of sodium. Generally, foods with less than 300 mg of sodium per serving fit into this eating plan. To find whole grains, look for the word "whole" as the first word in the ingredient list. Shopping Buy products labeled as "low-sodium" or "no salt added." Buy fresh foods. Avoid canned foods and premade or frozen meals. Cooking Avoid adding salt when cooking. Use salt-free seasonings or herbs instead of table salt or sea salt. Check with your health care provider or pharmacist before using salt substitutes. Do not fry foods. Cook foods using healthy methods such as baking, boiling, grilling, and broiling instead. Cook with heart-healthy oils, such as olive, canola, soybean, or sunflower oil. Meal planning Eat a balanced diet that includes: 5 or more servings of fruits and vegetables each day. At each meal, try to fill half of your plate with fruits and vegetables. Up to 6-8 servings of whole grains each day. Less than 6 oz of lean meat, poultry, or fish each day. A 3-oz serving of meat is about the same size as a deck of cards. One egg equals 1 oz. 2 servings of low-fat dairy each day. A serving of nuts, seeds, or beans 5 times each week. Heart-healthy fats. Healthy fats called Omega-3 fatty acids are found in foods such as flaxseeds and coldwater fish, like sardines, salmon, and mackerel. Limit how much you eat of the following: Canned or prepackaged foods.  Food that is high in trans fat, such as fried foods. Food that is high in saturated fat, such as fatty meat. Sweets, desserts, sugary drinks, and other foods with added sugar. Full-fat dairy products. Do not salt foods before eating. Try to eat at least 2 vegetarian meals each week. Eat more home-cooked food and less restaurant, buffet, and fast food. When eating at a restaurant, ask that your food be prepared with less salt or no salt, if possible. What foods are recommended? The items listed may not  be a complete list. Talk with your dietitian about what dietary choices are best for you. Grains Whole-grain or whole-wheat bread. Whole-grain or whole-wheat pasta. Brown rice. Orpah Cobb. Bulgur. Whole-grain and low-sodium cereals. Pita bread. Low-fat, low-sodium crackers. Whole-wheat flour tortillas. Vegetables Fresh or frozen vegetables (raw, steamed, roasted, or grilled). Low-sodium or reduced-sodium tomato and vegetable juice. Low-sodium or reduced-sodium tomato sauce and tomato paste. Low-sodium or reduced-sodium canned vegetables. Fruits All fresh, dried, or frozen fruit. Canned fruit in natural juice (without added sugar). Meat and other protein foods Skinless chicken or [place]. Ground chicken or [place]. Pork with fat trimmed off. Fish and seafood. Egg whites. Dried beans, peas, or lentils. Unsalted nuts, nut butters, and seeds. Unsalted canned beans. Lean cuts of beef with fat trimmed off. Low-sodium, lean deli meat. Dairy Low-fat (1%) or fat-free (skim) milk. Fat-free, low-fat, or reduced-fat cheeses. Nonfat, low-sodium ricotta or cottage cheese. Low-fat or nonfat yogurt. Low-fat, low-sodium cheese. Fats and oils Soft margarine without trans fats. Vegetable oil. Low-fat, reduced-fat, or light mayonnaise and salad dressings (reduced-sodium). Canola, safflower, olive, soybean, and sunflower oils. Avocado. Seasoning and other foods Herbs. Spices. Seasoning mixes without salt. Unsalted popcorn and pretzels. Fat-free sweets. What foods are not recommended? The items listed may not be a complete list. Talk with your dietitian about what dietary choices are best for you. Grains Baked goods made with fat, such as croissants, muffins, or some breads. Dry pasta or rice meal packs. Vegetables Creamed or fried vegetables. Vegetables in a cheese sauce. Regular canned vegetables (not low-sodium or reduced-sodium). Regular canned tomato sauce and paste (not low-sodium or reduced-sodium). Regular  tomato and vegetable juice (not low-sodium or reduced-sodium). Rosita Fire. Olives. Fruits Canned fruit in a light or heavy syrup. Fried fruit. Fruit in cream or butter sauce. Meat and other protein foods Fatty cuts of meat. Ribs. Fried meat. Tomasa Blase. Sausage. Bologna and other processed lunch meats. Salami. Fatback. Hotdogs. Bratwurst. Salted nuts and seeds. Canned beans with added salt. Canned or smoked fish. Whole eggs or egg yolks. Chicken or [place] with skin. Dairy Whole or 2% milk, cream, and half-and-half. Whole or full-fat cream cheese. Whole-fat or sweetened yogurt. Full-fat cheese. Nondairy creamers. Whipped toppings. Processed cheese and cheese spreads. Fats and oils Butter. Stick margarine. Lard. Shortening. Ghee. Bacon fat. Tropical oils, such as coconut, palm kernel, or palm oil. Seasoning and other foods Salted popcorn and pretzels. Onion salt, garlic salt, seasoned salt, table salt, and sea salt. Worcestershire sauce. Tartar sauce. Barbecue sauce. Teriyaki sauce. Soy sauce, including reduced-sodium. Steak sauce. Canned and packaged gravies. Fish sauce. Oyster sauce. Cocktail sauce. Horseradish that you find on the shelf. Ketchup. Mustard. Meat flavorings and tenderizers. Bouillon cubes. Hot sauce and Tabasco sauce. Premade or packaged marinades. Premade or packaged taco seasonings. Relishes. Regular salad dressings. Where to find more information: National Heart, Lung, and Blood Institute: PopSteam.is American Heart Association: www.heart.org Summary The DASH eating plan is a healthy eating plan that has been shown to reduce  high blood pressure (hypertension). It may also reduce your risk for type 2 diabetes, heart disease, and stroke. With the DASH eating plan, you should limit salt (sodium) intake to 2,300 mg a day. If you have hypertension, you may need to reduce your sodium intake to 1,500 mg a day. When on the DASH eating plan, aim to eat more fresh fruits and vegetables, whole  grains, lean proteins, low-fat dairy, and heart-healthy fats. Work with your health care provider or diet and nutrition specialist (dietitian) to adjust your eating plan to your individual calorie needs. This information is not intended to replace advice given to you by your health care provider. Make sure you discuss any questions you have with your health care provider. Document Released: 11/14/2011 Document Revised: 11/07/2017 Document Reviewed: 11/18/2016 Elsevier Patient Education  2020 [employer].

## 2023-09-18 DIAGNOSIS — I10 Essential (primary) hypertension: Secondary | ICD-10-CM | POA: Diagnosis not present

## 2023-09-18 DIAGNOSIS — Z5181 Encounter for therapeutic drug level monitoring: Secondary | ICD-10-CM | POA: Diagnosis not present

## 2023-09-19 LAB — BASIC METABOLIC PANEL
BUN/Creatinine Ratio: 13 (ref 9–23)
BUN: 11 mg/dL (ref 6–20)
CO2: 21 mmol/L (ref 20–29)
Calcium: 9.8 mg/dL (ref 8.7–10.2)
Chloride: 103 mmol/L (ref 96–106)
Creatinine, Ser: 0.82 mg/dL (ref 0.57–1.00)
Glucose: 76 mg/dL (ref 70–99)
Potassium: 4.5 mmol/L (ref 3.5–5.2)
Sodium: 138 mmol/L (ref 134–144)
eGFR: 95 mL/min/{1.73_m2} (ref >59)

## 2023-09-30 ENCOUNTER — Encounter (HOSPITAL_BASED_OUTPATIENT_CLINIC_OR_DEPARTMENT_OTHER): Payer: Medicaid Other

## 2023-10-16 ENCOUNTER — Other Ambulatory Visit (HOSPITAL_BASED_OUTPATIENT_CLINIC_OR_DEPARTMENT_OTHER): Payer: Self-pay

## 2023-10-16 ENCOUNTER — Ambulatory Visit (HOSPITAL_BASED_OUTPATIENT_CLINIC_OR_DEPARTMENT_OTHER): Payer: Medicaid Other | Admitting: Pharmacist Clinician (PhC)/ Clinical Pharmacy Specialist

## 2023-10-16 ENCOUNTER — Encounter (HOSPITAL_BASED_OUTPATIENT_CLINIC_OR_DEPARTMENT_OTHER): Payer: Self-pay | Admitting: Pharmacist Clinician (PhC)/ Clinical Pharmacy Specialist

## 2023-10-16 VITALS — BP 145/92 | HR 88 | Ht 67.0 in | Wt 164.4 lb

## 2023-10-16 DIAGNOSIS — I10 Essential (primary) hypertension: Secondary | ICD-10-CM

## 2023-10-16 MED ORDER — LISINOPRIL 10 MG PO TABS
10.0000 mg | ORAL_TABLET | Freq: Every day | ORAL | 3 refills | Status: DC
Start: 2023-10-16 — End: 2024-02-05
  Filled 2023-10-16: qty 90, 90d supply, fill #0

## 2023-10-16 NOTE — Patient Instructions (Signed)
Follow up appointment: December - we'll call for your appointment  Go to the lab in 2 weeks to check kidney function and potassium levels  Take your BP meds as follows:  Start lisinopril 10 mg once daily  Continue with all other medications Check your blood pressure at home daily (if able) and keep record of the readings.  Hypertension "High blood pressure"  Hypertension is often called "The Silent Killer." It rarely causes symptoms until it is extremely  high or has done damage to other organs in the body. For this reason, you should have your  blood pressure checked regularly by your physician. We will check your blood pressure  every time you see a provider at one of our offices.   Your blood pressure reading consists of two numbers. Ideally, blood pressure should be  below 120/80. The first ("top") number is called the systolic pressure. It measures the  pressure in your arteries as your heart beats. The second ("bottom") number is called the diastolic pressure. It measures the pressure in your arteries as the heart relaxes between beats.  The benefits of getting your blood pressure under control are enormous. A 10-point  reduction in systolic blood pressure can reduce your risk of stroke by 27% and heart failure by 28%  Your blood pressure goal is < 130/80  To check your pressure at home you will need to:  1. Sit up in a chair, with feet flat on the floor and back supported. Do not cross your ankles or legs. 2. Rest your left arm so that the cuff is about heart level. If the cuff goes on your upper arm,  then just relax the arm on the table, arm of the chair or your lap. If you have a wrist cuff, we  suggest relaxing your wrist against your chest (think of it as Pledging the Flag with the  wrong arm).  3. Place the cuff snugly around your arm, about 1 inch above the crook of your elbow. The  cords should be inside the groove of your elbow.  4. Sit quietly, with the cuff in  place, for about 5 minutes. After that 5 minutes press the power  button to start a reading. 5. Do not talk or move while the reading is taking place.  6. Record your readings on a sheet of paper. Although most cuffs have a memory, it is often  easier to see a pattern developing when the numbers are all in front of you.  7. You can repeat the reading after 1-3 minutes if it is recommended  Make sure your bladder is empty and you have not had caffeine or tobacco within the last 30 min  Always bring your blood pressure log with you to your appointments. If you have not brought your monitor in to be double checked for accuracy, please bring it to your next appointment.  You can find a list of quality blood pressure cuffs at validatebp.org

## 2023-10-16 NOTE — Assessment & Plan Note (Signed)
Assessment: BP is uncontrolled in office BP 144/96 mmHg;  above the goal (<130/80). Compliant with current medications. Tolerates amlodipine and spironolactone well, without any side effects Denies SOB, palpitation, chest pain, headaches,or swelling Reiterated the importance of regular exercise and low salt diet   Plan:  Start taking lisinopril 10 mg daily Continue taking amlodipine 10 mg and spironolactone 25 mg (both once daily) Patient to keep record of BP readings with heart rate and report to [place] at the next visit Patient to look at home BP cuff in pharmacy downstairs ([facility]) Patient to follow up with PharmD  in 1 month  Labs ordered today:  BMET 2 weeks

## 2023-10-16 NOTE — Progress Notes (Signed)
Office Visit    Patient Name: Angela Manning Date of Encounter: 10/16/2023  Primary Care Provider:  Reymundo Poll, MD Primary Cardiologist:  None  Chief Complaint    Hypertension - Advanced hypertension clinic  Past Medical History   Preeclampsia With last pregnancy, 11 years ago    Allergies  Allergen Reactions   Flagyl [Metronidazole] Rash and Other (See Comments)    "Breaks out in blisters"    History of Present Illness    Angela Manning is a 37 y.o. female patient who was referred to the [facility] by Dr. Lorriane Shire MD.  Patient was first diagnosed at the age of 47, and treated since that time.  She did have preeclampsia with one pregnancy.   She notes that her mother also has had hypertension for many years and her teen daughter is now also being monitored.    Blood Pressure Goal:  130/80  Current Medications: amlodipine 10 mg every day, spironolactone 25 mg every day - both in the morning  Adherence Assessment  Do you ever forget to take your medication? [] Yes [x] No  Do you ever skip doses due to side effects? [] Yes [x] No  Do you have trouble affording your medicines? [] Yes [x] No  Are you ever unable to pick up your medication due to transportation difficulties? [] Yes [x] No   Adherence strategy: none  Family Hx:  mother family; 7 siblings only 1 with hypertension, kids 15,15,13,10 - daughter (27) with hypertension - no meds yet, being monitored  Social Hx:      Tobacco: no  Alcohol: no  Caffeine: coffee daily  Diet:    mostly home cooked meals; no pork but other proteins; vegetables in all forms; snacks include fruit, chips, shrimp, yogurt,   Exercise: no regular exercise, walks on campus  Home BP readings:  no home meter   Accessory Clinical Findings    Lab Results  Component Value Date   CREATININE 0.82 09/18/2023   BUN 11 09/18/2023   NA 138 09/18/2023   K 4.5 09/18/2023   CL 103 09/18/2023   CO2 21  09/18/2023   Lab Results  Component Value Date   ALT 10 01/28/2023   AST 13 (L) 01/28/2023   ALKPHOS 52 01/28/2023   BILITOT 0.6 01/28/2023   Lab Results  Component Value Date   HGBA1C 4.8 04/19/2021    Screening for Secondary Hypertension:      09/10/2023    1:55 PM  Causes  Drugs/Herbals Screened     - Comments high salt.  1 coffee daily.  No NSAIDS.  Sleep Apnea Screened     - Comments no symtpoms  Thyroid Disease Screened  Hyperaldosteronism Screened     - Comments normal  Pheochromocytoma N/A  Cushing's Syndrome N/A  Hyperparathyroidism N/A  Coarctation of the Aorta Screened     - Comments BP symmetric  Compliance Screened     - Comments strugles at times.    Relevant Labs/Studies:    Latest Ref Rng & Units 09/18/2023   11:58 AM 06/25/2023    4:43 PM 01/28/2023    7:40 PM  Basic Labs  Sodium 134 - 144 mmol/L 138  135  137   Potassium 3.5 - 5.2 mmol/L 4.5  4.6  3.4   Creatinine 0.57 - 1.00 mg/dL 6.96  2.95  2.84        Latest Ref Rng & Units 09/25/2021    4:33 PM 10/07/2018   11:17 AM  Thyroid  TSH 0.450 - 4.500 uIU/mL 0.808  0.403        Latest Ref Rng & Units 04/19/2021   11:12 AM  Renin/Aldosterone   Aldosterone 0.0 - 30.0 ng/dL 5.0   Renin 1.610 - 9.604 ng/mL/hr 1.187   Aldos/Renin Ratio 0.0 - 30.0 4.2              09/10/2023    2:17 PM  Renovascular   Renal Artery [place] Completed Yes      Home Medications    Current Outpatient Medications  Medication Sig Dispense Refill   lisinopril (ZESTRIL) 10 MG tablet Take 1 tablet (10 mg total) by mouth daily. 90 tablet 3   amLODipine (NORVASC) 10 MG tablet Take 1 tablet (10 mg total) by mouth daily. 30 tablet 2   clindamycin (CLEOCIN) 300 MG capsule Take 1 capsule (300 mg total) by mouth 2 (two) times daily. (Patient not taking: Reported on 09/10/2023) 14 capsule 0   spironolactone (ALDACTONE) 25 MG tablet Take 1 tablet (25 mg total) by mouth daily. 90 tablet 3   No current facility-administered  medications for this visit.     Assessment & Plan   HYPERTENSION CONTROL Vitals:   10/16/23 1510 10/16/23 1517  BP: (!) 144/96 (!) 145/92    The patient's blood pressure is elevated above target today.  In order to address the patient's elevated BP: Blood pressure will be monitored at home to determine if medication changes need to be made.; A new medication was prescribed today.      Primary hypertension Assessment: BP is uncontrolled in office BP 144/96 mmHg;  above the goal (<130/80). Compliant with current medications. Tolerates amlodipine and spironolactone well, without any side effects Denies SOB, palpitation, chest pain, headaches,or swelling Reiterated the importance of regular exercise and low salt diet   Plan:  Start taking lisinopril 10 mg daily Continue taking amlodipine 10 mg and spironolactone 25 mg (both once daily) Patient to keep record of BP readings with heart rate and report to [place] at the next visit Patient to look at home BP cuff in pharmacy downstairs ([facility]) Patient to follow up with PharmD  in 1 month  Labs ordered today:  BMET 2 weeks   Phillips Hay PharmD CPP [facility]  [address] [place], [place] [address] [phone]

## 2023-10-28 ENCOUNTER — Encounter (HOSPITAL_COMMUNITY): Payer: Self-pay

## 2023-10-28 ENCOUNTER — Ambulatory Visit (HOSPITAL_COMMUNITY)
Admission: RE | Admit: 2023-10-28 | Discharge: 2023-10-28 | Disposition: A | Discharge status: Home / Self Care | Payer: Medicaid Other | Source: Ambulatory Visit | Attending: Family Medicine | Admitting: Family Medicine

## 2023-10-28 ENCOUNTER — Encounter (HOSPITAL_BASED_OUTPATIENT_CLINIC_OR_DEPARTMENT_OTHER): Payer: Medicaid Other

## 2023-10-28 VITALS — BP 151/95 | HR 77 | Temp 97.9°F | Resp 18

## 2023-10-28 DIAGNOSIS — N760 Acute vaginitis: Secondary | ICD-10-CM | POA: Insufficient documentation

## 2023-10-28 MED ORDER — CLINDAMYCIN HCL 300 MG PO CAPS: 300.0000 mg | ORAL_CAPSULE | Freq: Three times a day (TID) | ORAL | 0 refills | Status: AC

## 2023-10-28 NOTE — ED Provider Notes (Signed)
[facility]    CSN: [id number] Arrival date & time: 10/28/23  1507      History   Chief Complaint Chief Complaint  Patient presents with   vaginal irritation   SEXUALLY TRANSMITTED DISEASE    HPI Angela Manning is a 37 y.o. female.   HPI Here for vaginal irritation and odor.  Symptoms began this morning.  No abdominal pain or fever.  No dysuria  She has had BV recurrent and she is allergic to metronidazole which causes skin blisters and rash.  She is usually treated with oral clindamycin which works well for her   Past Medical History:  Diagnosis Date   Bacterial vaginosis    Hypertension    Hypertension affecting pregnancy, antepartum 10/27/2019   Ovarian cyst    Preeclampsia 09/10/2023   Pregnancy induced hypertension    Primary hypertension 03/03/2020    Patient Active Problem List   Diagnosis Date Noted   Abnormal Pap smear of cervix 10/23/2021   Generalized anxiety disorder 04/19/2021   Primary hypertension 03/03/2020   Ovarian cyst, left 03/01/2015   Menorrhagia with irregular cycle 03/01/2015   Dyspareunia 03/01/2015    Past Surgical History:  Procedure Laterality Date   DILATION AND EVACUATION N/A 02/21/2013   Procedure: DILATATION AND EVACUATION;  Surgeon: Reva Bores, MD;  Location: [facility];  Service: Gynecology;  Laterality: N/A;    OB History     Gravida  5   Para  4   Term  3   Preterm  1   AB      Living  4      SAB      IAB      Ectopic      Multiple      Live Births  4            Home Medications    Prior to Admission medications   Medication Sig Start Date End Date Taking? Authorizing Provider  clindamycin (CLEOCIN) 300 MG capsule Take 1 capsule (300 mg total) by mouth 3 (three) times daily for 7 days. 10/28/23 11/04/23 Yes Zenia Resides, MD  lisinopril (ZESTRIL) 10 MG tablet Take 1 tablet (10 mg total) by mouth daily. 10/16/23 01/14/24 Yes Chilton Si, MD  spironolactone (ALDACTONE) 25 MG  tablet Take 1 tablet (25 mg total) by mouth daily. 09/10/23  Yes Chilton Si, MD  amLODipine (NORVASC) 10 MG tablet Take 1 tablet (10 mg total) by mouth daily. 06/25/23   Raspet, Noberto Retort, PA-C  norethindrone (MICRONOR,CAMILA,ERRIN) 0.35 MG tablet Take 1 tablet (0.35 mg total) by mouth daily. Patient not taking: Reported on 08/23/2019 10/07/18 09/29/19  Conan Bowens, MD    Family History Family History  Problem Relation Age of Onset   Stroke Mother    Hypertension Mother    Valvular heart disease Mother    Healthy Father    Hypertension Maternal Grandmother    Hypertension Paternal Grandmother     Social History Social History   Tobacco Use   Smoking status: Former    Current packs/day: 0.25    Types: Cigarettes   Smokeless tobacco: Former    Quit date: 01/08/2015   Tobacco comments:    quit 3 years ago   Vaping Use   Vaping status: Never Used  Substance Use Topics   Alcohol use: Not Currently   Drug use: No     Allergies   Flagyl [metronidazole]   Review of Systems Review of Systems  Physical Exam Triage Vital Signs ED Triage Vitals  Encounter Vitals Group     BP 10/28/23 1528 (!) 151/95     Systolic BP Percentile --      Diastolic BP Percentile --      Pulse Rate 10/28/23 1528 77     Resp 10/28/23 1528 18     Temp 10/28/23 1528 97.9 F (36.6 C)     Temp Source 10/28/23 1528 Oral     SpO2 10/28/23 1528 98 %     Weight --      Height --      Head Circumference --      Peak Flow --      Pain Score 10/28/23 1526 0     Pain Loc --      Pain Education --      Exclude from Growth Chart --    No data found.  Updated Vital Signs BP (!) 151/95 (BP Location: Right Arm)   Pulse 77   Temp 97.9 F (36.6 C) (Oral)   Resp 18   LMP 09/24/2023   SpO2 98%   Visual Acuity Right Eye Distance:   Left Eye Distance:   Bilateral Distance:    Right Eye Near:   Left Eye Near:    Bilateral Near:     Physical Exam Vitals reviewed.      UC Treatments  / Results  Labs (all labs ordered are listed, but only abnormal results are displayed) Labs Reviewed  CERVICOVAGINAL ANCILLARY ONLY    EKG   Radiology No results found.  Procedures Procedures (including critical care time)  Medications Ordered in UC Medications - No data to display  Initial Impression / Assessment and Plan / UC Course  I have reviewed the triage vital signs and the nursing notes.  Pertinent labs & imaging results that were available during my care of the patient were reviewed by me and considered in my medical decision making (see chart for details).     Vaginal self swab is done, and we will notify of any positives on that and treat per protocol.  Clindamycin oral is sent in for her to take for the BV empirically.  I offered vaginal clindamycin, but she would prefer to take the oral that she knows that this worked for her in the past  I have asked her to please follow-up with OB/GYN Final Clinical Impressions(s) / UC Diagnoses   Final diagnoses:  Acute vaginitis     Discharge Instructions      --Take clindamycin 300 mg-- 1 capsule 3 times daily for 7 days  Staff will notify you if there is anything positive on the swab.  Please follow-up with OB/GYN about this recurrent trouble.       ED Prescriptions     Medication Sig Dispense Auth. Provider   clindamycin (CLEOCIN) 300 MG capsule Take 1 capsule (300 mg total) by mouth 3 (three) times daily for 7 days. 21 capsule Zenia Resides, MD      PDMP not reviewed this encounter.   Zenia Resides, MD 10/28/23 [phone]

## 2023-10-28 NOTE — ED Triage Notes (Signed)
Pt states that's she gets BV a lot and she changed her soap and her smell has been off. She would like only the cyto for STI as well.

## 2023-10-28 NOTE — Discharge Instructions (Addendum)
--  Take clindamycin 300 mg-- 1 capsule 3 times daily for 7 days  Staff will notify you if there is anything positive on the swab.  Please follow-up with OB/GYN about this recurrent trouble.

## 2023-10-29 LAB — CERVICOVAGINAL ANCILLARY ONLY
Bacterial Vaginitis (gardnerella): POSITIVE — AB
Candida Glabrata: NEGATIVE
Candida Vaginitis: NEGATIVE
Chlamydia: NEGATIVE
Comment: NEGATIVE
Comment: NEGATIVE
Comment: NEGATIVE
Comment: NEGATIVE
Comment: NEGATIVE
Comment: NORMAL
Neisseria Gonorrhea: NEGATIVE
Trichomonas: POSITIVE — AB

## 2023-10-31 ENCOUNTER — Telehealth (HOSPITAL_COMMUNITY): Payer: Self-pay

## 2023-10-31 MED ORDER — TINIDAZOLE 500 MG PO TABS: 500.0000 mg | ORAL_TABLET | Freq: Two times a day (BID) | ORAL | 0 refills | Status: AC

## 2023-10-31 MED ORDER — CLINDESSE 2 % VA CREA: 1.0000 | TOPICAL_CREAM | Freq: Once | VAGINAL | 0 refills | Status: AC

## 2023-10-31 NOTE — Telephone Encounter (Signed)
Pt has h/o reaction to metronidazole, but has tolerated tinidazole in early 2023, and later tests were negative for Trich, so that is sent for the Trich.  Clindesse vag cream sent for the BV

## 2023-10-31 NOTE — Telephone Encounter (Signed)
Patient calling in. Seen 10/28/23 and tested positive for BV and trich. Patient is allergic to Metronidazole so message was sent by the call back nurse to a provider.   Patient calling in today requesting treatment be sent in to [facility].   Secure chat sent to the providers on staff.

## 2023-12-05 ENCOUNTER — Ambulatory Visit (HOSPITAL_COMMUNITY): Admission: RE | Admit: 2023-12-05 | Payer: Medicaid Other | Source: Ambulatory Visit

## 2023-12-08 ENCOUNTER — Ambulatory Visit (HOSPITAL_COMMUNITY)
Admission: RE | Admit: 2023-12-08 | Discharge: 2023-12-08 | Disposition: A | Discharge status: Home / Self Care | Payer: Medicaid Other | Source: Ambulatory Visit | Attending: Internal Medicine | Admitting: Internal Medicine

## 2023-12-08 DIAGNOSIS — I10 Essential (primary) hypertension: Secondary | ICD-10-CM | POA: Diagnosis not present

## 2023-12-10 ENCOUNTER — Encounter (HOSPITAL_BASED_OUTPATIENT_CLINIC_OR_DEPARTMENT_OTHER): Payer: Self-pay

## 2023-12-10 DIAGNOSIS — I701 Atherosclerosis of renal artery: Secondary | ICD-10-CM

## 2023-12-30 ENCOUNTER — Emergency Department (HOSPITAL_COMMUNITY)
Admission: EM | Admit: 2023-12-30 | Discharge: 2023-12-31 | Disposition: A | Discharge status: Home / Self Care | Payer: Medicaid Other | Attending: Emergency Medicine | Admitting: Emergency Medicine

## 2023-12-30 ENCOUNTER — Encounter (HOSPITAL_COMMUNITY): Payer: Self-pay

## 2023-12-30 DIAGNOSIS — O209 Hemorrhage in early pregnancy, unspecified: Secondary | ICD-10-CM | POA: Insufficient documentation

## 2023-12-30 DIAGNOSIS — O4690 Antepartum hemorrhage, unspecified, unspecified trimester: Secondary | ICD-10-CM

## 2023-12-30 DIAGNOSIS — Z3A01 Less than 8 weeks gestation of pregnancy: Secondary | ICD-10-CM | POA: Diagnosis not present

## 2023-12-30 DIAGNOSIS — Z3A00 Weeks of gestation of pregnancy not specified: Secondary | ICD-10-CM | POA: Insufficient documentation

## 2023-12-30 LAB — CBC WITH DIFFERENTIAL/PLATELET
Abs Immature Granulocytes: 0.02 10*3/uL (ref 0.00–0.07)
Basophils Absolute: 0 10*3/uL (ref 0.0–0.1)
Basophils Relative: 0 %
Eosinophils Absolute: 0.3 10*3/uL (ref 0.0–0.5)
Eosinophils Relative: 4 %
HCT: 36.3 % (ref 36.0–46.0)
Hemoglobin: 11.4 g/dL — ABNORMAL LOW (ref 12.0–15.0)
Immature Granulocytes: 0 %
Lymphocytes Relative: 38 %
Lymphs Abs: 2.7 10*3/uL (ref 0.7–4.0)
MCH: 25.8 pg — ABNORMAL LOW (ref 26.0–34.0)
MCHC: 31.4 g/dL (ref 30.0–36.0)
MCV: 82.1 fL (ref 80.0–100.0)
Monocytes Absolute: 1.1 10*3/uL — ABNORMAL HIGH (ref 0.1–1.0)
Monocytes Relative: 15 %
Neutro Abs: 2.9 10*3/uL (ref 1.7–7.7)
Neutrophils Relative %: 43 %
Platelets: 277 10*3/uL (ref 150–400)
RBC: 4.42 MIL/uL (ref 3.87–5.11)
RDW: 13.2 % (ref 11.5–15.5)
WBC: 7 10*3/uL (ref 4.0–10.5)
nRBC: 0 % (ref 0.0–0.2)

## 2023-12-30 LAB — TYPE AND SCREEN
ABO/RH(D): A POS
Antibody Screen: NEGATIVE

## 2023-12-30 LAB — BASIC METABOLIC PANEL
Anion gap: 8 (ref 5–15)
BUN: 18 mg/dL (ref 6–20)
CO2: 24 mmol/L (ref 22–32)
Calcium: 9.5 mg/dL (ref 8.9–10.3)
Chloride: 107 mmol/L (ref 98–111)
Creatinine, Ser: 0.55 mg/dL (ref 0.44–1.00)
GFR, Estimated: >60 mL/min (ref >60)
Glucose, Bld: 111 mg/dL — ABNORMAL HIGH (ref 70–99)
Potassium: 4 mmol/L (ref 3.5–5.1)
Sodium: 139 mmol/L (ref 135–145)

## 2023-12-30 LAB — HCG, QUANTITATIVE, PREGNANCY: hCG, Beta Chain, Quant, S: 16 m[IU]/mL — ABNORMAL HIGH (ref <5)

## 2023-12-30 NOTE — ED Triage Notes (Signed)
Pt is coming in for vaginal bleeding x 3 weeks, she mentions this is abnormal for her, and there is some moderate amounts of bleeding. She mentions feeling generally week with some dizziness. She has no Hx of needing a transfusion.

## 2023-12-31 ENCOUNTER — Emergency Department (HOSPITAL_COMMUNITY): Payer: Medicaid Other

## 2023-12-31 DIAGNOSIS — O209 Hemorrhage in early pregnancy, unspecified: Secondary | ICD-10-CM | POA: Diagnosis not present

## 2023-12-31 NOTE — Discharge Instructions (Signed)
Call women's clinic in the morning, you will need repeat HCG testing. Can start prenatals if you like in the meantime. Follow-up with OB-GYN encouraged. Return here for new concerns.

## 2023-12-31 NOTE — ED Provider Notes (Signed)
[facility] Provider Note   CSN: [id number] Arrival date & time: 12/30/23  2013     History  Chief Complaint  Patient presents with   Vaginal Bleeding    Angela Manning is a 38 y.o. female.  The history is provided by the patient and medical records.  Vaginal Bleeding  38 year old female presenting to the ED with vaginal bleeding.  States she has had bleeding for about 3 weeks now, variable in severity of flow but is never fully stopped.  She does report some lower abdominal cramping.  She is also having some breast tenderness and increased emotionality at home.  She does feel very tired.  She has not had any syncopal events.  Has not taken a pregnancy test.  Home Medications Prior to Admission medications   Medication Sig Start Date End Date Taking? Authorizing Provider  amLODipine (NORVASC) 10 MG tablet Take 1 tablet (10 mg total) by mouth daily. 06/25/23   Raspet, Erin K, PA-C  lisinopril (ZESTRIL) 10 MG tablet Take 1 tablet (10 mg total) by mouth daily. 10/16/23 01/14/24  Chilton Si, MD  spironolactone (ALDACTONE) 25 MG tablet Take 1 tablet (25 mg total) by mouth daily. 09/10/23   Chilton Si, MD  norethindrone (MICRONOR,CAMILA,ERRIN) 0.35 MG tablet Take 1 tablet (0.35 mg total) by mouth daily. Patient not taking: Reported on 08/23/2019 10/07/18 09/29/19  Conan Bowens, MD      Allergies    Flagyl [metronidazole]    Review of Systems   Review of Systems  Genitourinary:  Positive for vaginal bleeding.  All other systems reviewed and are negative.   Physical Exam Updated Vital Signs BP (!) 147/83   Pulse (!) 126   Temp 98.6 F (37 C)   Resp 18   SpO2 100%   Physical Exam Vitals and nursing note reviewed.  Constitutional:      Appearance: She is well-developed.  HENT:     Head: Normocephalic and atraumatic.  Eyes:     Conjunctiva/sclera: Conjunctivae normal.     Pupils: Pupils are equal, round, and  reactive to light.  Cardiovascular:     Rate and Rhythm: Normal rate and regular rhythm.     Heart sounds: Normal heart sounds.  Pulmonary:     Effort: Pulmonary effort is normal. No respiratory distress.     Breath sounds: Normal breath sounds. No rhonchi.  Abdominal:     General: Bowel sounds are normal.     Palpations: Abdomen is soft.  Musculoskeletal:        General: Normal range of motion.     Cervical back: Normal range of motion.  Skin:    General: Skin is warm and dry.  Neurological:     Mental Status: She is alert and oriented to person, place, and time.     ED Results / Procedures / Treatments   Labs (all labs ordered are listed, but only abnormal results are displayed) Labs Reviewed  HCG, QUANTITATIVE, PREGNANCY - Abnormal; Notable for the following components:      Result Value   hCG, Beta Chain, Quant, S 16 (*)    All other components within normal limits  BASIC METABOLIC PANEL - Abnormal; Notable for the following components:   Glucose, Bld 111 (*)    All other components within normal limits  CBC WITH DIFFERENTIAL/PLATELET - Abnormal; Notable for the following components:   Hemoglobin 11.4 (*)    MCH 25.8 (*)    Monocytes Absolute  1.1 (*)    All other components within normal limits  CBC WITH DIFFERENTIAL/PLATELET  TYPE AND SCREEN    EKG EKG Interpretation Date/Time:  Tuesday December 30 2023 20:33:42 EST Ventricular Rate:  115 PR Interval:  146 QRS Duration:  86 QT Interval:  332 QTC Calculation: 459 R Axis:   80  Text Interpretation: Sinus tachycardia Right atrial enlargement Minimal voltage criteria for LVH, may be normal variant ( Sokolow-Lyon ) Cannot rule out Anterior infarct , age undetermined Abnormal ECG Since last tracing rate faster Otherwise no significant change Confirmed by Melene Plan [phone]) on 12/31/2023 12:10:36 AM  Radiology US OB LESS THAN 14 WEEKS WITH OB TRANSVAGINAL Result Date: 12/31/2023 CLINICAL DATA:  Vaginal bleeding.   Beta HCG 16.  Unsure of LMP. EXAM: OBSTETRIC <14 WK [place] AND TRANSVAGINAL OB US TECHNIQUE: Both transabdominal and transvaginal ultrasound examinations were performed for complete evaluation of the gestation as well as the maternal uterus, adnexal regions, and pelvic cul-de-sac. Transvaginal technique was performed to assess early pregnancy. COMPARISON:  None Available. FINDINGS: Intrauterine gestational sac: None Yolk sac:  Not Visualized. Embryo:  Not Visualized. Cardiac Activity: Not Visualized. Maternal uterus/adnexae: Normal ovaries.  No free fluid. IMPRESSION: No visualized intrauterine gestational sac. In the setting of positive pregnancy test, this reflects a pregnancy of unknown location. Differential considerations include early normal IUP, abnormal IUP, or nonvisualized ectopic pregnancy. Differentiation is achieved with serial beta HCG supplemented by repeat sonography as clinically warranted. Electronically Signed   By: Minerva Fester M.D.   On: 12/31/2023 01:57    Procedures Procedures    Medications Ordered in ED Medications - No data to display  ED Course/ Medical Decision Making/ A&P                                 Medical Decision Making Amount and/or Complexity of Data Reviewed Radiology: ordered. ECG/medicine tests: ordered and independent interpretation performed.   38 year old female with 3 weeks of vaginal bleeding.  Also reports some breast tenderness and increased emotionality.  hCG here is positive but very low at 16.  Her hemoglobin is stable and appears at her baseline.  No electrolyte derangements.  Spoke with MAU, CNM Hilda Lias-- ok to get [place] here.  If indeterminate findings, can just have follow-up with OB in clinic for repeat hcg testing and [place].  2:27 AM [place] without IUP identified.  Consider early pregnancy, ectopic, missed abortion/miscarriage, etc.  Remains HD stable here.  Will have her follow-up at [facility] for repeat hcg testing.  Encouraged to call in  the morning.  Can start prenatals if desired given unknown viability of pregnancy at this stage.  Can return here for new concerns.  Final Clinical Impression(s) / ED Diagnoses Final diagnoses:  Vaginal bleeding in pregnancy    Rx / DC Orders ED Discharge Orders     None         Garlon Hatchet, PA-C 12/31/23 0317    Melene Plan, DO 12/31/23 [id number]

## 2024-01-02 ENCOUNTER — Other Ambulatory Visit: Payer: Self-pay

## 2024-01-02 ENCOUNTER — Ambulatory Visit: Payer: Medicaid Other

## 2024-01-02 VITALS — BP 160/110 | HR 110 | Ht 67.0 in | Wt 159.0 lb

## 2024-01-02 DIAGNOSIS — Z3A01 Less than 8 weeks gestation of pregnancy: Secondary | ICD-10-CM

## 2024-01-02 DIAGNOSIS — O3680X0 Pregnancy with inconclusive fetal viability, not applicable or unspecified: Secondary | ICD-10-CM

## 2024-01-02 LAB — BETA HCG QUANT (REF LAB): hCG Quant: 12 m[IU]/mL

## 2024-01-02 NOTE — Progress Notes (Signed)
Beta HCG Follow-up Visit  Angela Manning presents to [facility] for follow-up beta HCG lab. She was seen in ED for vaginal bleeding on 1/21. [place] was done with no IUP visualized. Patient reports that she started her menstrual cycle on 12/05/23 and went to the ED because her cycle was going on for 3 weeks. Patient also reports that she took a home pregnancy test on 01/01/24 that was positive. Today, patient denies any pain or bleeding. Discussed with patient that we are following beta HCG levels today. Valid contact number for patient confirmed and patient reports that she has access to Mychart.  Patient BP was elevated today during this visit. First BP was 164/113 and recheck BP was 160/110. Patient states that she was in a rush to get to her appointment and did not take her blood pressure medicine this morning. Patient denies any visual disturbances, headaches, or edema. Advised patient to take her BP medicine and to recheck BP at home and to go the MAU if her BP is still elevated, has any visual disturbances, headaches, heavy vaginal bleeding and/or abdominal pain. Patient verbalized understanding.  Beta HCG results: 1/21 16  1/24 PENDING   Results and patient history reviewed with Dr. Jolayne Panther, who will follow-up with patient.  Quintella Reichert 01/02/2024 10:54 AM

## 2024-01-04 ENCOUNTER — Inpatient Hospital Stay (HOSPITAL_COMMUNITY)
Admission: AD | Admit: 2024-01-04 | Discharge: 2024-01-04 | Disposition: A | Discharge status: Home / Self Care | Payer: Medicaid Other | Source: Ambulatory Visit | Attending: Obstetrics and Gynecology | Admitting: Obstetrics and Gynecology

## 2024-01-04 ENCOUNTER — Encounter: Payer: Self-pay | Admitting: Family Medicine

## 2024-01-04 ENCOUNTER — Telehealth: Payer: Self-pay | Admitting: Family Medicine

## 2024-01-04 ENCOUNTER — Other Ambulatory Visit: Payer: Self-pay

## 2024-01-04 ENCOUNTER — Encounter (HOSPITAL_COMMUNITY): Payer: Self-pay | Admitting: Obstetrics and Gynecology

## 2024-01-04 DIAGNOSIS — O209 Hemorrhage in early pregnancy, unspecified: Secondary | ICD-10-CM | POA: Insufficient documentation

## 2024-01-04 DIAGNOSIS — Z3A01 Less than 8 weeks gestation of pregnancy: Secondary | ICD-10-CM | POA: Insufficient documentation

## 2024-01-04 LAB — COMPREHENSIVE METABOLIC PANEL
ALT: 39 U/L (ref 0–44)
AST: 31 U/L (ref 15–41)
Albumin: 3.3 g/dL — ABNORMAL LOW (ref 3.5–5.0)
Alkaline Phosphatase: 94 U/L (ref 38–126)
Anion gap: 12 (ref 5–15)
BUN: 18 mg/dL (ref 6–20)
CO2: 23 mmol/L (ref 22–32)
Calcium: 9.7 mg/dL (ref 8.9–10.3)
Chloride: 102 mmol/L (ref 98–111)
Creatinine, Ser: 0.57 mg/dL (ref 0.44–1.00)
GFR, Estimated: >60 mL/min (ref >60)
Glucose, Bld: 105 mg/dL — ABNORMAL HIGH (ref 70–99)
Potassium: 4.4 mmol/L (ref 3.5–5.1)
Sodium: 137 mmol/L (ref 135–145)
Total Bilirubin: 0.5 mg/dL (ref 0.0–1.2)
Total Protein: 6.3 g/dL — ABNORMAL LOW (ref 6.5–8.1)

## 2024-01-04 LAB — HCG, QUANTITATIVE, PREGNANCY: hCG, Beta Chain, Quant, S: 17 m[IU]/mL — ABNORMAL HIGH (ref <5)

## 2024-01-04 NOTE — Discharge Instructions (Addendum)
You were seen in the MAU to check your pregnancy hormone levels. Your pain was minimal and you were not bleeding. We were comfortable with having your go home to await your results.   Your pregnancy is not progressing normally and it might be a failed pregnancy or a possible pregnancy outside the uterus (ectopic)  We will contact you through MyChart with your results and next steps  Please return to the MAU for worsening pain, dizziness, lightheadedness or any other concern.

## 2024-01-04 NOTE — MAU Note (Signed)
Angela Manning is a 38 y.o. at Unknown here in MAU reporting: she's here for HCG level.  Denies VB and intermittent abdominal discomfort.  LMP: 11/15/2023 Onset of complaint: ongoing Pain score: 5 Vitals:   01/04/24 1632  BP: (!) 160/104  Pulse: (!) 114  Resp: 18  Temp: 98.4 F (36.9 C)  SpO2: 100%     FHT: NA  Lab orders placed from triage: None

## 2024-01-04 NOTE — Telephone Encounter (Signed)
Spoke with patient about her bhcg values which are concerning for an ectopic pregnancy  Date bHCG value  12/30/23 16  01/02/24 12  01/04/24 17   Recommended patient come to MAU for MXT therapy. She is going to try to come tonight and at the latest tomorrow. She agrees and plans to come Reviewed warning sx/s of ruptured ectopic Patient voiced understanding Alerted MAU charge.   Federico Flake, MD, MPH, ABFM, [facility] Attending Physician Center for [facility]

## 2024-01-04 NOTE — MAU Provider Note (Signed)
History     CSN: [id number]  Arrival date and time: 01/04/24 1608   Event Date/Time   First Provider Initiated Contact with Patient 01/04/24 1723      Chief Complaint  Patient presents with   HCG   HPI Angela Manning is [id number] at [redacted]w[redacted]d presenting with history of vaginal bleeding and for a follow up BHCG. She was seen at the ER on 1/22 for bleeding and found to have a BHCG of 16 and had outpatient follow up which showed a qnt of 12. She reports no worsening abdominal pain, mild lower abdominal cramping. Reports no dizziness, lightheadedness.   OB History     Gravida  6   Para  4   Term  3   Preterm  1   AB      Living  4      SAB      IAB      Ectopic      Multiple      Live Births  4           Past Medical History:  Diagnosis Date   Bacterial vaginosis    Hypertension    Hypertension affecting pregnancy, antepartum 10/27/2019   Ovarian cyst    Preeclampsia 09/10/2023   Pregnancy induced hypertension    Primary hypertension 03/03/2020    Past Surgical History:  Procedure Laterality Date   DILATION AND EVACUATION N/A 02/21/2013   Procedure: DILATATION AND EVACUATION;  Surgeon: Reva Bores, MD;  Location: [facility];  Service: Gynecology;  Laterality: N/A;    Family History  Problem Relation Age of Onset   Stroke Mother    Hypertension Mother    Valvular heart disease Mother    Healthy Father    Hypertension Maternal Grandmother    Hypertension Paternal Grandmother     Social History   Tobacco Use   Smoking status: Former    Current packs/day: 0.25    Types: Cigarettes   Smokeless tobacco: Former    Quit date: 01/08/2015   Tobacco comments:    quit 3 years ago   Vaping Use   Vaping status: Never Used  Substance Use Topics   Alcohol use: Not Currently   Drug use: No    Allergies:  Allergies  Allergen Reactions   Flagyl [Metronidazole] Rash and Other (See Comments)    "Breaks out in blisters"    Medications Prior to Admission   Medication Sig Dispense Refill Last Dose/Taking   amLODipine (NORVASC) 10 MG tablet Take 1 tablet (10 mg total) by mouth daily. 30 tablet 2    lisinopril (ZESTRIL) 10 MG tablet Take 1 tablet (10 mg total) by mouth daily. 90 tablet 3    spironolactone (ALDACTONE) 25 MG tablet Take 1 tablet (25 mg total) by mouth daily. 90 tablet 3     Review of Systems  Constitutional:  Negative for chills and fever.  HENT:  Negative for congestion and sore throat.   Eyes:  Negative for pain and visual disturbance.  Respiratory:  Negative for cough, chest tightness and shortness of breath.   Cardiovascular:  Negative for chest pain.  Gastrointestinal:  Negative for abdominal pain, diarrhea, nausea and vomiting.  Endocrine: Negative for cold intolerance and heat intolerance.  Genitourinary:  Negative for dysuria and flank pain.  Musculoskeletal:  Negative for back pain.  Skin:  Negative for rash.  Allergic/Immunologic: Negative for food allergies.  Neurological:  Negative for dizziness and light-headedness.  Psychiatric/Behavioral:  Negative for agitation.  Physical Exam   Blood pressure (!) 163/102, pulse (!) 117, temperature 98.4 F (36.9 C), temperature source Oral, resp. rate 18, height 5\' 7"  (1.702 m), weight 72 kg, last menstrual period 11/15/2023, SpO2 100%.  Physical Exam Vitals and nursing note reviewed.  Constitutional:      General: She is not in acute distress.    Appearance: She is well-developed.  HENT:     Head: Normocephalic and atraumatic.  Eyes:     General: No scleral icterus.    Conjunctiva/sclera: Conjunctivae normal.  Cardiovascular:     Rate and Rhythm: Normal rate.  Pulmonary:     Effort: Pulmonary effort is normal.  Chest:     Chest wall: No tenderness.  Abdominal:     Palpations: Abdomen is soft.     Tenderness: There is no abdominal tenderness. There is no guarding or rebound.  Genitourinary:    Vagina: Normal.  Musculoskeletal:        General: Normal range  of motion.     Cervical back: Normal range of motion and neck supple.  Skin:    General: Skin is warm and dry.     Findings: No rash.  Neurological:     Mental Status: She is alert and oriented to person, place, and time.    MAU Course  Procedures  MDM- high  DDX includes pregnancy loss vs ectopic. Concerning for ectopic given bhcg is not dropping appropriately Reviewed 1/22 ER visit and 1/24 Outpatient visit [place] did not show IUP on 1/22 and I reviewed the images directly  Patient is stable on exam and vitals Opted to be discharged and has access to MyChart for results review. Discussed I will call if I am concerned about ectopic due to a bhcg that is not falling again. I reviewed this would mean she would need to return tot he MAU for treatment. She still requests discharge.   No results found for this or any previous visit (from the past 24 hours).\   Assessment and Plan   1. Bleeding in early pregnancy   2. [redacted] weeks gestation of pregnancy   - - Return to [facility] in 1 week for repeat bhcg  Federico Flake 01/04/2024, 5:26 PM

## 2024-01-07 ENCOUNTER — Encounter: Payer: Self-pay | Admitting: Family Medicine

## 2024-01-09 ENCOUNTER — Ambulatory Visit: Payer: Medicaid Other | Attending: Cardiovascular Disease | Admitting: Cardiovascular Disease

## 2024-01-09 ENCOUNTER — Encounter: Payer: Self-pay | Admitting: Cardiovascular Disease

## 2024-01-09 VITALS — BP 110/76 | HR 94 | Ht 67.0 in | Wt 157.0 lb

## 2024-01-09 DIAGNOSIS — R011 Cardiac murmur, unspecified: Secondary | ICD-10-CM

## 2024-01-09 DIAGNOSIS — I10 Essential (primary) hypertension: Secondary | ICD-10-CM | POA: Diagnosis not present

## 2024-01-09 NOTE — Assessment & Plan Note (Signed)
Angela Manning was referred to me by Dr. Chilton Si in the hypertension clinic to rule out renovascular hypertension.  She apparently has been hypertensive since she was 38 years old on multiple medications.  There is a question of whether this was gestational.  Her blood pressure today is 110/76 and she is on amlodipine, lisinopril and spironolactone.  She had renal Doppler studies performed 12/08/2023 that suggested the possibility of fibromuscular dysplasia although her renal aortic ratio was fairly low at 2.45.  Given the fact that her blood pressure is under good control and that she potentially is a month pregnant I do not want to confirm with a CTA.  I will see her back in 1 year for follow-up.

## 2024-01-09 NOTE — Patient Instructions (Addendum)
 Your physician has requested that you have an echocardiogram. Echocardiography is a painless test that uses sound waves to create images of your heart. It provides your doctor with information about the size and shape of your heart and how well your heart's chambers and valves are working. This procedure takes approximately one hour. There are no restrictions for this procedure. Please do NOT wear cologne, perfume, aftershave, or lotions (deodorant is allowed). Please arrive 15 minutes prior to your appointment time.  Please note: We ask at that you not bring children with you during ultrasound (echo/ vascular) testing. Due to room size and safety concerns, children are not allowed in the ultrasound rooms during exams. Our front office staff cannot provide observation of children in our lobby area while testing is being conducted. An adult accompanying a patient to their appointment will only be allowed in the ultrasound room at the discretion of the ultrasound technician under special circumstances. We apologize for any inconvenience. [address]   Follow-Up: At [facility], you and your health needs are our priority.  As part of our continuing mission to provide you with exceptional heart care, we have created designated Provider Care Teams.  These Care Teams include your primary Cardiologist (physician) and Advanced Practice Providers (APPs -  Physician Assistants and Nurse Practitioners) who all work together to provide you with the care you need, when you need it.  We recommend signing up for the patient portal called "MyChart".  Sign up information is provided on this After Visit Summary.  MyChart is used to connect with patients for Virtual Visits (Telemedicine).  Patients are able to view lab/test results, encounter notes, upcoming appointments, etc.  Non-urgent messages can be sent to your provider as well.   To learn more about what you can do with MyChart, go to  [email or web address].    Your next appointment:   12 month(s)  Provider:   Nanetta Batty MD

## 2024-01-09 NOTE — Progress Notes (Signed)
01/09/2024 Angela Manning   02-25-1986  [id number]  Primary Physician Angela Poll, MD Primary Cardiologist: Angela Gess MD Angela Manning, Angela Manning  HPI:  Angela Manning is a 38 y.o. single African-American female mother of 4 children who works as a Scientist, research (medical).  She was referred by Dr. Chilton Manning in our hypertension clinic for evaluation about possible renal vascular hypertension.  She apparently has been hypertensive since age 43 on multiple medications.  She has no other cardiac risk factors.  She had renal Doppler studies performed 12/08/2023 that suggested the possibility of right renal artery FMD although her renal aortic ratio was only 2.45.  Her right kidney is approximately 2 cm smaller than her left kidney however.  She does complain of some dyspnea on exertion but denies chest pain.   Current Meds  Medication Sig   amLODipine (NORVASC) 10 MG tablet Take 1 tablet (10 mg total) by mouth daily.   lisinopril (ZESTRIL) 10 MG tablet Take 1 tablet (10 mg total) by mouth daily.   spironolactone (ALDACTONE) 25 MG tablet Take 1 tablet (25 mg total) by mouth daily.     Allergies  Allergen Reactions   Flagyl [Metronidazole] Rash and Other (See Comments)    "Breaks out in blisters"    Social History   Socioeconomic History   Marital status: Single    Spouse name: Not on file   Number of children: Not on file   Years of education: Not on file   Highest education level: Not on file  Occupational History   Not on file  Tobacco Use   Smoking status: Former    Current packs/day: 0.25    Types: Cigarettes   Smokeless tobacco: Former    Quit date: 01/08/2015   Tobacco comments:    quit 3 years ago   Vaping Use   Vaping status: Never Used  Substance and Sexual Activity   Alcohol use: Not Currently   Drug use: No   Sexual activity: Yes    Partners: Male    Birth control/protection: None  Other Topics Concern   Not on file  Social History Narrative   Not  on file   Social Drivers of Health   Financial Resource Strain: Medium Risk (05/07/2023)   Overall Financial Resource Strain (CARDIA)    Difficulty of Paying Living Expenses: Somewhat hard  Food Insecurity: No Food Insecurity (05/07/2023)   Hunger Vital Sign    Worried About Running Out of Food in the Last Year: Never true    Ran Out of Food in the Last Year: Never true  Transportation Needs: No Transportation Needs (05/07/2023)   PRAPARE - Administrator, Civil Service (Medical): No    Lack of Transportation (Non-Medical): No  Physical Activity: Inactive (09/10/2023)   Exercise Vital Sign    Days of Exercise per Week: 0 days    Minutes of Exercise per Session: 0 min  Stress: Not on file  Social Connections: Moderately Integrated (05/07/2023)   Social Connection and Isolation Panel [NHANES]    Frequency of Communication with Friends and Family: More than three times a week    Frequency of Social Gatherings with Friends and Family: More than three times a week    Attends Religious Services: More than 4 times per year    Active Member of [employer] or Organizations: No    Attends Banker Meetings: 1 to 4 times per year    Marital Status: Never married  Intimate Partner Violence: Not At Risk (05/07/2023)   Humiliation, Afraid, Rape, and Kick questionnaire    Fear of Current or Ex-Partner: No    Emotionally Abused: No    Physically Abused: No    Sexually Abused: No     Review of Systems: General: negative for chills, fever, night sweats or weight changes.  Cardiovascular: negative for chest pain, dyspnea on exertion, edema, orthopnea, palpitations, paroxysmal nocturnal dyspnea or shortness of breath Dermatological: negative for rash Respiratory: negative for cough or wheezing Urologic: negative for hematuria Abdominal: negative for nausea, vomiting, diarrhea, bright red blood per rectum, melena, or hematemesis Neurologic: negative for visual changes, syncope, or  dizziness All other systems reviewed and are otherwise negative except as noted above.    Blood pressure 110/76, pulse 94, height 5\' 7"  (1.702 m), weight 157 lb (71.2 kg), last menstrual period 11/15/2023, SpO2 99%.  General appearance: alert and no distress Neck: no adenopathy, no carotid bruit, no JVD, supple, symmetrical, trachea midline, and thyroid not enlarged, symmetric, no tenderness/mass/nodules Lungs: clear to auscultation bilaterally Heart: 2/6 outflow tract murmur Extremities: extremities normal, atraumatic, no cyanosis or edema Pulses: 2+ and symmetric Skin: Skin color, texture, turgor normal. No rashes or lesions Neurologic: Grossly normal  EKG not performed today      ASSESSMENT AND PLAN:   Primary hypertension Angela Manning was referred to me by Dr. Chilton Manning in the hypertension clinic to rule out renovascular hypertension.  She apparently has been hypertensive since she was 38 years old on multiple medications.  There is a question of whether this was gestational.  Her blood pressure today is 110/76 and she is on amlodipine, lisinopril and spironolactone.  She had renal Doppler studies performed 12/08/2023 that suggested the possibility of fibromuscular dysplasia although her renal aortic ratio was fairly low at 2.45.  Given the fact that her blood pressure is under good control and that she potentially is a month pregnant I do not want to confirm with a CTA.  I will see her back in 1 year for follow-up.     Angela Gess MD FACP,FACC,FAHA, [facility] 01/09/2024 2:48 PM

## 2024-01-12 ENCOUNTER — Other Ambulatory Visit (HOSPITAL_COMMUNITY)
Admission: RE | Admit: 2024-01-12 | Discharge: 2024-01-12 | Disposition: A | Discharge status: Home / Self Care | Payer: Medicaid Other | Source: Ambulatory Visit | Attending: Family Medicine | Admitting: Family Medicine

## 2024-01-12 ENCOUNTER — Ambulatory Visit (INDEPENDENT_AMBULATORY_CARE_PROVIDER_SITE_OTHER): Payer: Medicaid Other | Admitting: *Deleted

## 2024-01-12 ENCOUNTER — Encounter: Payer: Self-pay | Admitting: *Deleted

## 2024-01-12 ENCOUNTER — Other Ambulatory Visit: Payer: Self-pay

## 2024-01-12 VITALS — BP 171/112 | HR 121 | Ht 67.0 in | Wt 156.9 lb

## 2024-01-12 DIAGNOSIS — N898 Other specified noninflammatory disorders of vagina: Secondary | ICD-10-CM | POA: Insufficient documentation

## 2024-01-12 DIAGNOSIS — O3680X0 Pregnancy with inconclusive fetal viability, not applicable or unspecified: Secondary | ICD-10-CM

## 2024-01-12 DIAGNOSIS — Z3A01 Less than 8 weeks gestation of pregnancy: Secondary | ICD-10-CM

## 2024-01-12 LAB — BETA HCG QUANT (REF LAB): hCG Quant: 3 m[IU]/mL

## 2024-01-12 NOTE — Progress Notes (Signed)
Here for stat bhcg. She reports she did not go to hospital for treatment because she wanted to be sure . Last bleeding ( pink spotting) was 01/04/24. Denies any pain since MAU visit and none today. BP elevated at 178/121, 171/112. States her PCp told her changing lisinopril and did not take it today, but did  take her Amlodipine and Aldactone. Discussed with Dr. Donavan Foil and patent does not need to go to ER right now but once we determine results and plan of care she will need to either take meds as ordered or switch meds and follow up with PCP. I advised patient when I call with results of stat bhcg will discuss BP/ meds. Also c/o may have bv again, would like to check. Self swab obtained and explained if positive , will be contacted with treatment.  Nancy Fetter [phone] bhcg results=3 received . Reveiwed results, history with Dr. Nobie Putnam.  He advised shows patient had miscarriage, no more bhcg needed, may be offered sab follow up if desired. I called patient and left message I am calling with results and will call again this afternoon, please listen out for my call. Nancy Fetter 3:13 I called again and left a message I am calling with results and since I have not reached you and this is second call I will send you a MyChart message. Please read message or call if you cannot read your messages.  Nancy Fetter

## 2024-01-13 ENCOUNTER — Other Ambulatory Visit: Payer: Self-pay | Admitting: Family Medicine

## 2024-01-13 ENCOUNTER — Telehealth: Payer: Self-pay

## 2024-01-13 LAB — CERVICOVAGINAL ANCILLARY ONLY
Bacterial Vaginitis (gardnerella): POSITIVE — AB
Candida Glabrata: NEGATIVE
Candida Vaginitis: NEGATIVE
Chlamydia: NEGATIVE
Comment: NEGATIVE
Comment: NEGATIVE
Comment: NEGATIVE
Comment: NEGATIVE
Comment: NEGATIVE
Comment: NORMAL
Neisseria Gonorrhea: NEGATIVE
Trichomonas: POSITIVE — AB

## 2024-01-13 MED ORDER — TINIDAZOLE 500 MG PO TABS: 2.0000 g | ORAL_TABLET | Freq: Once | ORAL | 0 refills | Status: DC

## 2024-01-13 MED ORDER — CLINDAMYCIN HCL 300 MG PO CAPS: 300.0000 mg | ORAL_CAPSULE | Freq: Two times a day (BID) | ORAL | 0 refills | Status: AC

## 2024-01-13 NOTE — Telephone Encounter (Addendum)
 Attempted to call patient regarding her recent lab results. Left VM to call our office back.  Rosaline, RN  ----- Message from Norleen LULLA Rover sent at 01/13/2024  1:45 PM EST ----- Patient has BV and trichomoniasis.  Will send prescription to patient's pharmacy to treat both.  Could someone please call the patient and let her know about her diagnosis.  Patient has allergy to metronidazole  but has had tinnitus all in the past.  Will send 2 g etanidazole once and clindamycin  300 mg twice daily for 7 days to patient's pharmacy which should treat both the BV and the trick.  Could you please recommend that she take a daily probiotic while taking antibiotics.

## 2024-01-14 ENCOUNTER — Telehealth: Payer: Self-pay

## 2024-01-14 NOTE — Telephone Encounter (Signed)
 Pt's concern was addressed in another encounter.

## 2024-01-14 NOTE — Telephone Encounter (Signed)
 Patient called stating that she missed our call and needs to request treatment.    [name],RN  01/14/24

## 2024-01-14 NOTE — Telephone Encounter (Signed)
 Called pt and discussed test results as well as treatment has been sent to her pharmacy. She was advised that her partner will also need treatment for Trichomonas as it is a sexually transmitted infection. He may receive treatment @ [facility]. They should wait to have sexual intercourse until 2 weeks after they bith have been treated. Pt voiced understanding.

## 2024-01-21 ENCOUNTER — Encounter (HOSPITAL_BASED_OUTPATIENT_CLINIC_OR_DEPARTMENT_OTHER): Payer: Medicaid Other | Admitting: Cardiovascular Disease

## 2024-02-02 ENCOUNTER — Ambulatory Visit (HOSPITAL_COMMUNITY): Payer: Medicaid Other | Attending: Cardiology

## 2024-02-02 DIAGNOSIS — R011 Cardiac murmur, unspecified: Secondary | ICD-10-CM | POA: Diagnosis not present

## 2024-02-02 LAB — ECHOCARDIOGRAM COMPLETE
Area-P 1/2: 3.66 cm2
MV M vel: 5.38 m/s
MV Peak grad: 115.8 mmHg
S' Lateral: 3.38 cm

## 2024-02-04 ENCOUNTER — Encounter (HOSPITAL_COMMUNITY): Payer: Self-pay

## 2024-02-04 ENCOUNTER — Other Ambulatory Visit: Payer: Self-pay

## 2024-02-04 ENCOUNTER — Emergency Department (HOSPITAL_COMMUNITY): Payer: Medicaid Other

## 2024-02-04 ENCOUNTER — Emergency Department (HOSPITAL_COMMUNITY)
Admission: EM | Admit: 2024-02-04 | Discharge: 2024-02-04 | Discharge status: Against Medical Advice | Payer: Medicaid Other | Attending: Emergency Medicine | Admitting: Emergency Medicine

## 2024-02-04 DIAGNOSIS — R000 Tachycardia, unspecified: Secondary | ICD-10-CM | POA: Diagnosis not present

## 2024-02-04 DIAGNOSIS — I10 Essential (primary) hypertension: Secondary | ICD-10-CM | POA: Insufficient documentation

## 2024-02-04 DIAGNOSIS — I671 Cerebral aneurysm, nonruptured: Secondary | ICD-10-CM | POA: Diagnosis not present

## 2024-02-04 DIAGNOSIS — Z79899 Other long term (current) drug therapy: Secondary | ICD-10-CM | POA: Insufficient documentation

## 2024-02-04 DIAGNOSIS — R42 Dizziness and giddiness: Secondary | ICD-10-CM | POA: Diagnosis not present

## 2024-02-04 DIAGNOSIS — H538 Other visual disturbances: Secondary | ICD-10-CM | POA: Diagnosis not present

## 2024-02-04 DIAGNOSIS — R590 Localized enlarged lymph nodes: Secondary | ICD-10-CM | POA: Diagnosis not present

## 2024-02-04 DIAGNOSIS — Z5329 Procedure and treatment not carried out because of patient's decision for other reasons: Secondary | ICD-10-CM | POA: Diagnosis not present

## 2024-02-04 DIAGNOSIS — E049 Nontoxic goiter, unspecified: Secondary | ICD-10-CM | POA: Diagnosis not present

## 2024-02-04 DIAGNOSIS — I672 Cerebral atherosclerosis: Secondary | ICD-10-CM | POA: Diagnosis not present

## 2024-02-04 LAB — CBC
HCT: 37 % (ref 36.0–46.0)
Hemoglobin: 11.6 g/dL — ABNORMAL LOW (ref 12.0–15.0)
MCH: 24.6 pg — ABNORMAL LOW (ref 26.0–34.0)
MCHC: 31.4 g/dL (ref 30.0–36.0)
MCV: 78.6 fL — ABNORMAL LOW (ref 80.0–100.0)
Platelets: 263 10*3/uL (ref 150–400)
RBC: 4.71 MIL/uL (ref 3.87–5.11)
RDW: 13 % (ref 11.5–15.5)
WBC: 8.6 10*3/uL (ref 4.0–10.5)
nRBC: 0 % (ref 0.0–0.2)

## 2024-02-04 LAB — BASIC METABOLIC PANEL
Anion gap: 10 (ref 5–15)
BUN: 20 mg/dL (ref 6–20)
CO2: 22 mmol/L (ref 22–32)
Calcium: 10.3 mg/dL (ref 8.9–10.3)
Chloride: 106 mmol/L (ref 98–111)
Creatinine, Ser: 0.66 mg/dL (ref 0.44–1.00)
GFR, Estimated: >60 mL/min (ref >60)
Glucose, Bld: 80 mg/dL (ref 70–99)
Potassium: 4.5 mmol/L (ref 3.5–5.1)
Sodium: 138 mmol/L (ref 135–145)

## 2024-02-04 LAB — URINALYSIS, ROUTINE W REFLEX MICROSCOPIC
Bilirubin Urine: NEGATIVE
Glucose, UA: NEGATIVE mg/dL
Hgb urine dipstick: NEGATIVE
Ketones, ur: NEGATIVE mg/dL
Leukocytes,Ua: NEGATIVE
Nitrite: NEGATIVE
Protein, ur: NEGATIVE mg/dL
Specific Gravity, Urine: 1.017 (ref 1.005–1.030)
pH: 6 (ref 5.0–8.0)

## 2024-02-04 LAB — CK: Total CK: 43 U/L (ref 38–234)

## 2024-02-04 LAB — TROPONIN I (HIGH SENSITIVITY)
Troponin I (High Sensitivity): 31 ng/L — ABNORMAL HIGH (ref <18)
Troponin I (High Sensitivity): 31 ng/L — ABNORMAL HIGH (ref <18)

## 2024-02-04 LAB — MAGNESIUM: Magnesium: 1.8 mg/dL (ref 1.7–2.4)

## 2024-02-04 LAB — CBG MONITORING, ED: Glucose-Capillary: 77 mg/dL (ref 70–99)

## 2024-02-04 LAB — HCG, SERUM, QUALITATIVE: Preg, Serum: NEGATIVE

## 2024-02-04 MED ORDER — LACTATED RINGERS IV BOLUS
1000.0000 mL | Freq: Once | INTRAVENOUS | Status: AC
  Administered 2024-02-04: 1000 mL via INTRAVENOUS

## 2024-02-04 MED ORDER — IOHEXOL 350 MG/ML SOLN: 75.0000 mL | Freq: Once | INTRAVENOUS | Status: DC | PRN

## 2024-02-04 MED ORDER — METOCLOPRAMIDE HCL 5 MG/ML IJ SOLN
10.0000 mg | Freq: Once | INTRAMUSCULAR | Status: AC
  Administered 2024-02-04: 10 mg via INTRAVENOUS
  Filled 2024-02-04: qty 2

## 2024-02-04 MED ORDER — DIPHENHYDRAMINE HCL 25 MG PO CAPS
25.0000 mg | ORAL_CAPSULE | Freq: Once | ORAL | Status: AC
  Administered 2024-02-04: 25 mg via ORAL
  Filled 2024-02-04: qty 1

## 2024-02-04 MED ORDER — ACETAMINOPHEN 500 MG PO TABS
1000.0000 mg | ORAL_TABLET | Freq: Once | ORAL | Status: AC
  Administered 2024-02-04: 1000 mg via ORAL
  Filled 2024-02-04: qty 2

## 2024-02-04 MED ORDER — IOHEXOL 350 MG/ML SOLN
75.0000 mL | Freq: Once | INTRAVENOUS | Status: AC | PRN
  Administered 2024-02-04: 75 mL via INTRAVENOUS

## 2024-02-04 MED ORDER — KETOROLAC TROMETHAMINE 15 MG/ML IJ SOLN
15.0000 mg | Freq: Once | INTRAMUSCULAR | Status: AC
  Administered 2024-02-04: 15 mg via INTRAVENOUS
  Filled 2024-02-04: qty 1

## 2024-02-04 NOTE — ED Provider Notes (Signed)
 [place] EMERGENCY DEPARTMENT AT [facility] Provider Note   CSN: [id number] Arrival date & time: 02/04/24  1229     History  Chief Complaint  Patient presents with   Dizziness    Angela Manning is a 38 y.o. female.  Patient is a 38 year old female with a past medical history of hypertension presenting to the emergency department with dizziness and weakness.  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  She states that she was able to finish work and then when driving home had recurrence of her dizziness and had to stop the car and lay her seat flat.  She denies any associated chest pain.  She denies any history of VTE, any recent hospitalization or surgery, any recent long travel car or plane.  She states that she has had an associated headache with nausea and denies any vomiting.  She states that she has had shortness of breath ongoing for about the past month as well as weakness and tremors in her arms and legs.  She also reports that she has had an itchy rash to the right side of her back.  She states that she started lisinopril in December and is unsure if it is related to this.  She denies any new soaps, lotions or detergents and denies anyone at home with a similar rash.  She states that she feels weak in her bilateral hips with walking up the steps.  She states that her parents have had similar weakness but they never knew the cause and have never been diagnosed with any autoimmune issues.  The history is provided by the patient.  Dizziness      Home Medications Prior to Admission medications   Medication Sig Start Date End Date Taking? Authorizing Provider  tinidazole (TINDAMAX) 500 MG tablet TAKE 4 TABLETS BY MOUTH AS ONE DOSE 01/13/24   Cresenzo, Cyndi Lennert, MD  amLODipine (NORVASC) 10 MG tablet Take 1 tablet (10 mg total) by mouth daily. 06/25/23   Raspet, Erin K, PA-C   lisinopril (ZESTRIL) 10 MG tablet Take 1 tablet (10 mg total) by mouth daily. Patient not taking: Reported on 01/12/2024 10/16/23 01/14/24  Chilton Si, MD  spironolactone (ALDACTONE) 25 MG tablet Take 1 tablet (25 mg total) by mouth daily. 09/10/23   Chilton Si, MD  norethindrone (MICRONOR,CAMILA,ERRIN) 0.35 MG tablet Take 1 tablet (0.35 mg total) by mouth daily. Patient not taking: Reported on 08/23/2019 10/07/18 09/29/19  Conan Bowens, MD      Allergies    Flagyl [metronidazole]    Review of Systems   Review of Systems  Neurological:  Positive for dizziness.    Physical Exam Updated Vital Signs BP (!) 144/83   Pulse (!) 111   Temp 98.5 F (36.9 C) (Oral)   Resp (!) 29   Ht 5\' 7"  (1.702 m)   Wt 71.2 kg   LMP 11/15/2023 (Exact Date)   SpO2 97%   BMI 24.59 kg/m  Physical Exam Vitals and nursing note reviewed.  Constitutional:      General: She is not in acute distress.    Appearance: Normal appearance.  HENT:     Head: Normocephalic and atraumatic.     Nose: Nose normal.     Mouth/Throat:     Mouth: Mucous membranes are moist.     Pharynx: Oropharynx is clear.  Eyes:  Extraocular Movements: Extraocular movements intact.     Conjunctiva/sclera: Conjunctivae normal.     Pupils: Pupils are equal, round, and reactive to light.  Cardiovascular:     Rate and Rhythm: Regular rhythm. Tachycardia present.     Heart sounds: Normal heart sounds.  Pulmonary:     Effort: Pulmonary effort is normal.     Breath sounds: Normal breath sounds.  Abdominal:     General: Abdomen is flat.     Palpations: Abdomen is soft.     Tenderness: There is no abdominal tenderness.  Musculoskeletal:        General: Normal range of motion.     Cervical back: Normal range of motion.     Comments: Mild tremor in arms and legs with extension  Skin:    General: Skin is warm and dry.     Findings: Rash (Papular pruritic rash to right flank/low back) present.  Neurological:      Mental Status: She is alert and oriented to person, place, and time.     Comments: 5/5 strength in all muscle groups in bilateral UE 4/5 strength in bilateral hip flexion, 5/5 strength in bilateral knee extension, plantar/dorsiflexion Sensation intact in all 4 extremities  Psychiatric:        Mood and Affect: Mood normal.        Behavior: Behavior normal.     ED Results / Procedures / Treatments   Labs (all labs ordered are listed, but only abnormal results are displayed) Labs Reviewed  CBC - Abnormal; Notable for the following components:      Result Value   Hemoglobin 11.6 (*)    MCV 78.6 (*)    MCH 24.6 (*)    All other components within normal limits  URINALYSIS, ROUTINE W REFLEX MICROSCOPIC - Abnormal; Notable for the following components:   APPearance HAZY (*)    All other components within normal limits  TROPONIN I (HIGH SENSITIVITY) - Abnormal; Notable for the following components:   Troponin I (High Sensitivity) 31 (*)    All other components within normal limits  TROPONIN I (HIGH SENSITIVITY) - Abnormal; Notable for the following components:   Troponin I (High Sensitivity) 31 (*)    All other components within normal limits  BASIC METABOLIC PANEL  HCG, SERUM, QUALITATIVE  MAGNESIUM  CK  CBG MONITORING, ED    EKG EKG Interpretation Date/Time:  Wednesday February 04 2024 12:48:36 EST Ventricular Rate:  115 PR Interval:  137 QRS Duration:  75 QT Interval:  322 QTC Calculation: 446 R Axis:   71  Text Interpretation: Sinus tachycardia Biatrial enlargement Left ventricular hypertrophy ST elevation suggests acute pericarditis No significant change since last tracing Confirmed by Elayne Snare ([id number]) on 02/04/2024 4:27:56 PM  Radiology CT ANGIO HEAD NECK W WO CM Result Date: 02/04/2024 CLINICAL DATA:  Dizziness, weakness in both lower extremities, blurred vision. EXAM: CT ANGIOGRAPHY HEAD AND NECK WITH AND WITHOUT CONTRAST TECHNIQUE: Multidetector CT imaging of  the head and neck was performed using the standard protocol during bolus administration of intravenous contrast. Multiplanar CT image reconstructions and MIPs were obtained to evaluate the vascular anatomy. Carotid stenosis measurements (when applicable) are obtained utilizing NASCET criteria, using the distal internal carotid diameter as the denominator. RADIATION DOSE REDUCTION: This exam was performed according to the departmental dose-optimization program which includes automated exposure control, adjustment of the mA and/or kV according to patient size and/or use of iterative reconstruction technique. CONTRAST:  75mL OMNIPAQUE IOHEXOL 350 MG/ML SOLN COMPARISON:  CT head 05/07/2023 FINDINGS: CT HEAD FINDINGS Brain: No acute intracranial hemorrhage. No CT evidence of acute infarct. No edema, mass effect, or midline shift. The basilar cisterns are patent. Ventricles: Ventricles are normal in size and configuration. Vascular: No hyperdense vessel. Skull: No acute or aggressive finding. Sinuses/orbits: The visualized paranasal sinuses are clear. Orbits are symmetric. Other: Mastoid air cells are clear. CTA NECK FINDINGS Aortic arch: Standard configuration of the aortic arch. Imaged portion shows no evidence of aneurysm or dissection. No significant stenosis of the major arch vessel origins. Pulmonary arteries: As permitted by contrast timing, there are no filling defects in the visualized pulmonary arteries. Subclavian arteries: The subclavian arteries are patent bilaterally. Right carotid system: No evidence of dissection, stenosis (50% or greater), or occlusion. Left carotid system: No evidence of dissection, stenosis (50% or greater), or occlusion. Minimal focal atherosclerotic calcification at the carotid bifurcation without stenosis. Vertebral arteries: Codominant. No evidence of dissection, stenosis (50% or greater), or occlusion. Tortuosity of the left V1 segment and the bilateral V2 and V3 segments.  Skeleton: No acute or aggressive finding noted. Other neck: The visualized airway is patent. Mildly prominent bilateral cervical lymph nodes measuring up to 0.7 cm in short axis, likely reactive. Diffuse enlargement of the thyroid without focal nodularity. Upper chest: Visualized lung apices are clear. Review of the MIP images confirms the above findings CTA HEAD FINDINGS ANTERIOR CIRCULATION: The intracranial ICAs are patent bilaterally. No significant stenosis or proximal occlusion. There is a 3 x 2 x 3 mm inferiorly directed outpouching along the left supraclinoid ICA concerning for small posterior communicating artery aneurysm. MCAs: The middle cerebral arteries are patent bilaterally. There is apparent mild narrowing of the left M1 segment which may be secondary to artifact. ACAs: Patent bilaterally. Fenestrated appearance of the distal left A1 segment. POSTERIOR CIRCULATION: No significant stenosis, proximal occlusion, aneurysm, or vascular malformation. PCAs: Patent bilaterally.  Fetal origin of the right PCA. Pcomm: The posterior communicating arteries are visualized bilaterally. SCAs: The superior cerebellar arteries are patent bilaterally. Basilar artery: Patent.  Mild tortuosity of the basilar artery. AICAs: Not well visualized. PICAs: Patent Vertebral arteries: The intracranial vertebral arteries are patent. Venous sinuses: As permitted by contrast timing, patent. Anatomic variants: Fetal origin of the right PCA. Fenestrated left A1 segment. Review of the MIP images confirms the above findings IMPRESSION: 1. No acute intracranial abnormality. 2. No large vessel occlusion or significant stenosis. 3. 3 mm inferiorly directed outpouching along the left supraclinoid ICA concerning for small posterior communicating artery aneurysm. 4. No significant stenosis or occlusion in the neck. 5. Diffuse enlargement of the thyroid without focal nodularity. Recommend correlation with thyroid lab values. Electronically  Signed   By: Emily Filbert M.D.   On: 02/04/2024 19:16    Procedures Procedures    Medications Ordered in ED Medications  iohexol (OMNIPAQUE) 350 MG/ML injection 75 mL (has no administration in time range)  lactated ringers bolus 1,000 mL (0 mLs Intravenous Stopped 02/04/24 1831)  ketorolac (TORADOL) 15 MG/ML injection 15 mg (15 mg Intravenous Given 02/04/24 1654)  metoCLOPramide (REGLAN) injection 10 mg (10 mg Intravenous Given 02/04/24 1654)  acetaminophen (TYLENOL) tablet 1,000 mg (1,000 mg Oral Given 02/04/24 1652)  diphenhydrAMINE (BENADRYL) capsule 25 mg (25 mg Oral Given 02/04/24 1652)  iohexol (OMNIPAQUE) 350 MG/ML injection 75 mL (75 mLs Intravenous Contrast Given 02/04/24 1728)    ED Course/ Medical Decision Making/ A&P Clinical Course as of 02/04/24 2023  Wed Feb 04, 2024  1947 CTA without  acute abnormality, incidentally showed possible small aneurysm as well as enlarged thyroid. Patient reported to RN that she would like to leave prior to completion of her work up. [VK]  2017 The patient has decided to leave against medical advice. The patient had a normal mental status examination and understands her condition and the risks of leaving including PE, hyperthyroidism that could lead to permanent disability and death. The patient has had an opportunity to ask questions about her medical condition. The patient has been informed that she may return for care at any time and has been referred to her   physician. The patient's family member was present for the conversation.  [VK]    Clinical Course User Index [VK] Rexford Maus, DO                                 Medical Decision Making This patient presents to the ED with chief complaint(s) of dizziness, headache, weakness with pertinent past medical history of HTN which further complicates the presenting complaint. The complaint involves an extensive differential diagnosis and also carries with it a high risk of complications  and morbidity.    The differential diagnosis includes dehydration, electrolyte abnormality, arrhythmia, anemia, orthostatic hypotension, considering PE in the setting of her tachycardia, others otherwise low risk by Wells criteria, she has no focal neurologic deficits making CVA or TIA less likely, headache is mild and was not sudden onset making ICH or mass effect less likely, considering muscle inflammatory condition  Additional history obtained: Additional history obtained from family Records reviewed outpatient cardiology records  ED Course and Reassessment: On patient's arrival she is mildly hypertensive and tachycardic but otherwise in no acute distress.  Does have mild weakness in hip flexion and otherwise is neurovascularly intact.  Patient was initially evaluated by provider in triage and had EKG, labs and CTA ordered.  Patient's EKG showed sinus tachycardia without acute ischemic changes.  Labs showed mildly elevated troponin of 31 that was flat on repeat.  Labs are otherwise at baseline.  Will add on mag and CK as well as CT PE study.  She will be started on fluids and given migraine cocktail and will be closely reassessed.  Independent labs interpretation:  The following labs were independently interpreted: mildly elevated troponin -> flat on repeat; anemia at baseline  Independent visualization of imaging: - I independently visualized the following imaging with scope of interpretation limited to determining acute life threatening conditions related to emergency care: CTA Head/neck, which revealed possible aneurysm, enlarged thyroid  Consultation: - Consulted or discussed management/test interpretation w/ external professional: N/A  Consideration for admission or further workup: patient recommended further work up with thyroid function and CTPE but left against medical advice prior to completion of work up  Social Determinants of health: N/A    Amount and/or Complexity of Data  Reviewed Labs: ordered. Radiology: ordered.  Risk OTC drugs. Prescription drug management.          Final Clinical Impression(s) / ED Diagnoses Final diagnoses:  Dizziness  Enlarged thyroid  Intracerebral aneurysm  Tachycardia  Left against medical advice    Rx / DC Orders ED Discharge Orders     None         Rexford Maus, DO 02/04/24 2023

## 2024-02-04 NOTE — ED Notes (Signed)
 Pt requested to leave AMA. MD spoke to pt. Pt has signed AMA forms

## 2024-02-04 NOTE — ED Provider Triage Note (Signed)
 Emergency Medicine Provider Triage Evaluation Note  Angela Manning , a 38 y.o. female  was evaluated in triage.  Pt complains of dizziness described as floating, weakness in her bilateral lower extremities, blurred vision.  Patient reports that the symptoms all began 30 minutes prior to arrival.  Reports that she was driving her car when they began.  States she developed dizziness described as floating in the air.  Also reports that she began to have blurred vision bilaterally but states that her blurred vision has subsided.  Reports that she also has weakness in her legs that is been present for 1 month.  She is tachycardic into the 120s.  She denies any chest pain or shortness of breath.  Reports that she recently had echocardiogram done which showed that she had an ejection fraction of 50 to 55%.  Denies nausea, vomiting, abdominal pain, dysuria, fevers.  Denies any low back pain.  Denies neck pain or headache.  Review of Systems  Positive:  Negative:   Physical Exam  BP (!) 160/110   Pulse (!) 111   Temp 98.4 F (36.9 C) (Oral)   Resp 16   Ht 5\' 7"  (1.702 m)   Wt 71.2 kg   LMP 11/15/2023 (Exact Date)   SpO2 100%   BMI 24.59 kg/m  Gen:   Awake, no distress   Resp:  Normal effort  MSK:   Moves extremities without difficulty  Other:  No neurodeficits on exam  Medical Decision Making  Medically screening exam initiated at 1:08 PM.  Appropriate orders placed.  Angela Manning was informed that the remainder of the evaluation will be completed by another provider, this initial triage assessment does not replace that evaluation, and the importance of remaining in the ED until their evaluation is complete.     Al Decant, PA-C 02/04/24 1309

## 2024-02-04 NOTE — ED Triage Notes (Signed)
 30 minutes PTA, pt states her head started feeling heavy, eyelids felt heavy and she did not feel right. Pt woke up with a headache, states it has been improving, but is now on the left side of her face near the front. Had ECHO on Monday and states it was normal.

## 2024-02-04 NOTE — Discharge Instructions (Signed)
 You were seen in the emergency department for your dizziness.  Your workup show far showed that your heart enzyme was mildly elevated though unchanged on repeat and got a CT scan of your head that showed no signs of stroke but did show possible aneurysm as well as an enlarged thyroid.  You are recommended to have a CT scan of your chest as well to evaluate for blood clot but left prior to completion of your workup.  You should follow-up with your primary doctor to have your thyroid numbers rechecked and your symptoms rechecked and can follow-up with neurosurgery to monitor the aneurysm.  You are at risk that this could be a blood clot that could be life-threatening.  You can return to the ER at any time if you would like to have your symptoms rechecked or if you are having worsening dizziness and passed out, severe chest pain, worsening shortness of breath or any other new or concerning symptoms.

## 2024-02-04 NOTE — ED Notes (Signed)
 Noted pt seen eating [employer] fried chicken in the lobby accompanied by family member.

## 2024-02-04 NOTE — ED Notes (Signed)
 Attempted to get orthostatic vitals from pt, but was told that she did not want them done. Said that she was hungry and ready to leave. She also asked if I could take her IV out. I let her know that I would notify the nurse.

## 2024-02-05 ENCOUNTER — Ambulatory Visit: Payer: Medicaid Other | Admitting: Student

## 2024-02-05 VITALS — BP 183/105 | HR 114 | Temp 98.2°F | Ht 67.0 in | Wt 155.9 lb

## 2024-02-05 DIAGNOSIS — I10 Essential (primary) hypertension: Secondary | ICD-10-CM

## 2024-02-05 DIAGNOSIS — E049 Nontoxic goiter, unspecified: Secondary | ICD-10-CM

## 2024-02-05 DIAGNOSIS — R29898 Other symptoms and signs involving the musculoskeletal system: Secondary | ICD-10-CM | POA: Diagnosis present

## 2024-02-05 MED ORDER — LISINOPRIL 10 MG PO TABS: 10.0000 mg | ORAL_TABLET | Freq: Every day | ORAL | 3 refills | Status: DC

## 2024-02-05 MED ORDER — AMLODIPINE BESYLATE 10 MG PO TABS: 10.0000 mg | ORAL_TABLET | Freq: Every day | ORAL | 2 refills | Status: DC

## 2024-02-05 MED ORDER — SPIRONOLACTONE 25 MG PO TABS: 25.0000 mg | ORAL_TABLET | Freq: Every day | ORAL | 3 refills | Status: AC

## 2024-02-05 NOTE — Progress Notes (Signed)
 CC: Acute Concern of bilateral lower extremity weakness  HPI:  Angela Manning is a 38 y.o. female with pertinent PMH of HTN, abnormal uterine bleeding, and anxiety who presents as above. Please see assessment and plan below for further details.  Review of Systems:   Pertinent items noted in HPI and/or A&P.  Physical Exam:  Vitals:   02/05/24 1553 02/05/24 1631  BP: (!) 165/110 (!) 183/105  Pulse: (!) 121 (!) 114  Temp: 98.2 F (36.8 C)   TempSrc: Oral   SpO2: 98%   Weight: 155 lb 14.4 oz (70.7 kg)   Height: 5\' 7"  (1.702 m)     Constitutional: Well-appearing adult female. In no acute distress. HEENT: Normocephalic, atraumatic, Sclera non-icteric, PERRL, EOM intact Cardio:Regular rate and rhythm. 2+ bilateral radial and dorsalis pedis  pulses. Pulm:Clear to auscultation bilaterally. Normal work of breathing on room air. Abdomen: Soft, non-tender, non-distended, positive bowel sounds. ZOX:[id number] for extremity edema, muscle atrophy, rash or skin changes.  No spinal or paraspinal tenderness in the thoracolumbar spine Skin:Warm and dry. Neuro:Alert and oriented x3. No focal deficit noted.  Normal gait and able to get up from a seated position without aid or upper extremity boost.  Strength exam limited by effort with 1 out of 5 hip flexion, 3 out of 5 knee flexion/extension and ankle dorsiflexion/plantarflexion.  Bilateral lower extremity reflexes intact.  Sensation intact in bilateral lower extremities. Psych:Pleasant mood and affect.   Assessment & Plan:   Weakness of both lower extremities Patient presents today with primary concern of 1 month of bilateral lower extremity weakness that seems most predominant in the proximal muscles.  She has no sensory symptoms, upper extremity symptoms, fevers, weight loss, bowel/bladder incontinence, perineal anesthesia, or other red flag symptoms.  She also presented to the ED recently with blurry vision and dizziness as well as altered  facial sensation concerning for a stroke with a workup including a CTA of the head and neck without significant abnormalities.  She did leave AMA from the ED.  On exam she is able to walk independently, get up from a chair and step up onto the exam table, lower extremity reflexes are intact.  Strength exam is limited by effort and there is 1 out of 5 hip flexor strength, 3 out of 5 knee extension/flexion and plantar/dorsiflexion.  When laying in a supine position with her leg straight she is unable to lift her legs however when she bends her knee she is able to fully flex at the hip without issue.  No low back spinal or paraspinal tenderness.  Overall there is no clear diagnosis or likely diagnosis and she apparently has a follow-up with neurosurgery in the next week.  I also think she would benefit from neurology follow-up.  Differential currently includes some myopathy or possible myelopathy/demyelinating syndrome however these would be presenting very atypically.  Also considering somatic symptom disorder, factitious disorder, and malingering but we will need to rule out other causes of her symptoms before treating for these.  Plan - Continue with follow-up with neurosurgery and no referral to neurology - Strict return precautions discussed  Thyroid enlargement CT of the head and neck in the ED showed diffuse enlargement of the thyroid.  She is not having significant signs or symptoms of hypo or hyper thyroidism at the moment but does not have a recent TSH or T4. - TSH and T4 today  Primary hypertension Blood pressure today significantly elevated at 183/105 but she has not had her  blood pressure medications for the past week.  She has been following with cardiology for concerns for renovascular hypertension.  Recommended that she continue to follow-up with them and has an appointment on 03/03/2024. - Pick up and continue amlodipine 10 mg daily, lisinopril 10 mg daily, and spironolactone 25 mg daily     Patient discussed with Dr. Alfonzo Beers, DO [facility] Internal Medicine Resident PGY-2 Clinic Phone: [phone] Pager: [phone]

## 2024-02-05 NOTE — Patient Instructions (Signed)
  Thank you, Ms.Sheran Fava, for allowing [place] to provide your care today. Today we discussed . . .  > Weakness       -I do not think your weakness is due to anything immediately dangerous but I am not quite sure what is causing it at this point.  I am glad you have a follow-up with neurosurgery and I have sent a referral over to neurology.  If the office to follow-up with next week thinks that you did not need a neurology referral let us know and we will cancel it.  I would like you to watch out for any new or worsening symptoms including bowel or bladder incontinence, acutely worsening weakness, weakness in other parts your body, numbness, fevers, new back pain, or any other bothersome symptoms.  I looked up the side effects of your blood pressure medications and I do not think that they are contributing to this so I have sent in refills and you can resume them. > Thyroid enlargement       -Please come back tomorrow to have your thyroid hormone checked.  If there is any abnormalities I will call you with results and we will talk about the plan.  Referrals ordered today:    Referral Orders         Ambulatory referral to Neurology      Follow up: 3 months    Remember:     Should you have any questions or concerns please call the internal medicine clinic at [phone].     Rocky Morel, DO [facility]

## 2024-02-09 DIAGNOSIS — E049 Nontoxic goiter, unspecified: Secondary | ICD-10-CM | POA: Insufficient documentation

## 2024-02-09 DIAGNOSIS — R29898 Other symptoms and signs involving the musculoskeletal system: Secondary | ICD-10-CM | POA: Insufficient documentation

## 2024-02-09 NOTE — Assessment & Plan Note (Signed)
 CT of the head and neck in the ED showed diffuse enlargement of the thyroid.  She is not having significant signs or symptoms of hypo or hyper thyroidism at the moment but does not have a recent TSH or T4. - TSH and T4 today

## 2024-02-09 NOTE — Assessment & Plan Note (Signed)
 Patient presents today with primary concern of 1 month of bilateral lower extremity weakness that seems most predominant in the proximal muscles.  She has no sensory symptoms, upper extremity symptoms, fevers, weight loss, bowel/bladder incontinence, perineal anesthesia, or other red flag symptoms.  She also presented to the ED recently with blurry vision and dizziness as well as altered facial sensation concerning for a stroke with a workup including a CTA of the head and neck without significant abnormalities.  She did leave AMA from the ED.  On exam she is able to walk independently, get up from a chair and step up onto the exam table, lower extremity reflexes are intact.  Strength exam is limited by effort and there is 1 out of 5 hip flexor strength, 3 out of 5 knee extension/flexion and plantar/dorsiflexion.  When laying in a supine position with her leg straight she is unable to lift her legs however when she bends her knee she is able to fully flex at the hip without issue.  No low back spinal or paraspinal tenderness.  Overall there is no clear diagnosis or likely diagnosis and she apparently has a follow-up with neurosurgery in the next week.  I also think she would benefit from neurology follow-up.  Differential currently includes some myopathy or possible myelopathy/demyelinating syndrome however these would be presenting very atypically.  Also considering somatic symptom disorder, factitious disorder, and malingering but we will need to rule out other causes of her symptoms before treating for these.  Plan - Continue with follow-up with neurosurgery and no referral to neurology - Strict return precautions discussed

## 2024-02-09 NOTE — Assessment & Plan Note (Signed)
 Blood pressure today significantly elevated at 183/105 but she has not had her blood pressure medications for the past week.  She has been following with cardiology for concerns for renovascular hypertension.  Recommended that she continue to follow-up with them and has an appointment on 03/03/2024. - Pick up and continue amlodipine 10 mg daily, lisinopril 10 mg daily, and spironolactone 25 mg daily

## 2024-02-11 DIAGNOSIS — I671 Cerebral aneurysm, nonruptured: Secondary | ICD-10-CM | POA: Diagnosis not present

## 2024-02-11 NOTE — Progress Notes (Signed)
 Internal Medicine Clinic Attending  Case discussed with the resident at the time of the visit.  We reviewed the resident's history and exam and pertinent patient test results.  I agree with the assessment, diagnosis, and plan of care documented in the resident's note. Exam as noted by Dr. Geraldo Pitter not consistent across strength testing. Does not seem consistent with a significant muscle or nervous system disorder, patient reports she already has a neurosurgery appointment set up. Would like her to see neurology given reported symptoms and recent ED visit.

## 2024-02-26 ENCOUNTER — Encounter: Payer: Self-pay | Admitting: Internal Medicine

## 2024-02-26 ENCOUNTER — Ambulatory Visit: Payer: Self-pay | Admitting: Internal Medicine

## 2024-02-26 VITALS — BP 134/112 | HR 110 | Temp 98.2°F | Ht 67.0 in | Wt 145.2 lb

## 2024-02-26 DIAGNOSIS — E049 Nontoxic goiter, unspecified: Secondary | ICD-10-CM

## 2024-02-26 DIAGNOSIS — D509 Iron deficiency anemia, unspecified: Secondary | ICD-10-CM | POA: Diagnosis not present

## 2024-02-26 DIAGNOSIS — R29898 Other symptoms and signs involving the musculoskeletal system: Secondary | ICD-10-CM

## 2024-02-26 DIAGNOSIS — I10 Essential (primary) hypertension: Secondary | ICD-10-CM | POA: Diagnosis not present

## 2024-02-26 MED ORDER — CARVEDILOL 6.25 MG PO TABS: 6.2500 mg | ORAL_TABLET | Freq: Two times a day (BID) | ORAL | 2 refills | Status: DC

## 2024-02-26 NOTE — Progress Notes (Signed)
 Established Patient Office Visit  Subjective   Patient ID: Angela Manning, female    DOB: 05-07-1986  Age: 38 y.o. MRN: [id number]  Chief Complaint  Patient presents with   Medical Management of Chronic Issues    Rash back/chest. Swollen neck. Cough x 2 days. Weight loss.Angry all the times. Intermittent chest pain. Ankles slightly swollen.   Angela Manning is here for multiple acute complaints as documented above.  Namely that she has a chronic goiter has a current rash has had 10 pound weight loss.  She has had many years of a thyroid goiter reports history of hyperthyroidism and thyroid issues in the family.  Wonders if rash is caused by lisinopril, she has not stopped taking it.  She has seen the HTN clinic at cardiology and referred over to Dr Allyson Sabal for consideration of angiography but was pregnant and BP well controlled at the time.  She has since had a miscarriage. (Bhcg neg in feb)  Cough is mild no fever or chills.  Chest pain is atypical nonexertional intermittent and mostly right-sided.  Having some muscle aches mainly in her thighs reports mother has a history of unspecified muscle disorder.    Objective:     BP (!) 134/112 (BP Location: Left Arm, Patient Position: Sitting, Cuff Size: Normal)   Pulse (!) 110   Temp 98.2 F (36.8 C) (Oral)   Ht 5\' 7"  (1.702 m)   Wt 145 lb 3.2 oz (65.9 kg)   LMP 11/15/2023 (Exact Date)   SpO2 100% Comment: RA  Breastfeeding No   BMI 22.74 kg/m  BP Readings from Last 3 Encounters:  02/26/24 (!) 134/112  02/05/24 (!) 183/105  02/04/24 (!) 144/83   Wt Readings from Last 3 Encounters:  02/26/24 145 lb 3.2 oz (65.9 kg)  02/05/24 155 lb 14.4 oz (70.7 kg)  02/04/24 157 lb (71.2 kg)      Physical Exam Constitutional:      Appearance: Normal appearance.  Neck:     Thyroid: Thyromegaly present.  Cardiovascular:     Rate and Rhythm: Tachycardia present.     Heart sounds: No murmur heard. Pulmonary:     Effort: Pulmonary effort is normal.      Breath sounds: Normal breath sounds.  Musculoskeletal:     Comments: Tenderness of thigh muscles bilaterally, strength grossly intact  Skin:    Comments: Fine maculopapular rash throuout torso and back  Neurological:     Mental Status: She is alert.      No results found for any visits on 02/26/24.  Last CBC Lab Results  Component Value Date   WBC 8.6 02/04/2024   HGB 11.6 (L) 02/04/2024   HCT 37.0 02/04/2024   MCV 78.6 (L) 02/04/2024   MCH 24.6 (L) 02/04/2024   RDW 13.0 02/04/2024   PLT 263 02/04/2024   Last metabolic panel Lab Results  Component Value Date   GLUCOSE 80 02/04/2024   NA 138 02/04/2024   K 4.5 02/04/2024   CL 106 02/04/2024   CO2 22 02/04/2024   BUN 20 02/04/2024   CREATININE 0.66 02/04/2024   GFRNONAA >60 02/04/2024   CALCIUM 10.3 02/04/2024   PROT 6.3 (L) 01/04/2024   ALBUMIN 3.3 (L) 01/04/2024   BILITOT 0.5 01/04/2024   ALKPHOS 94 01/04/2024   AST 31 01/04/2024   ALT 39 01/04/2024   ANIONGAP 10 02/04/2024   Last thyroid functions Lab Results  Component Value Date   TSH 0.808 09/25/2021      The  ASCVD Risk score (Arnett DK, et al., 2019) failed to calculate for the following reasons:   The 2019 ASCVD risk score is only valid for ages 32 to 78    Assessment & Plan:   Problem List Items Addressed This Visit       Cardiovascular and Mediastinum   Primary hypertension   May need to rule out renal vascular but with her tachycardia and other signs of overt hyperthyroidism we will go ahead and start Coreg 6.25 twice daily today and check thyroid labs.      Relevant Medications   carvedilol (COREG) 6.25 MG tablet     Endocrine   Thyroid enlargement - Primary   I am most concerned for hyperthyroidism at this visit.  Given her hypertension, weight loss, rash and tachycardia.  Will check thyroid labs      Relevant Medications   carvedilol (COREG) 6.25 MG tablet   Other Relevant Orders   TSH   T4, Free   T3     Other    Weakness of both lower extremities   Will check CK      Relevant Orders   CK, total   Other Visit Diagnoses       Microcytic anemia       Relevant Orders   CBC with Diff   Iron, TIBC and Ferritin Panel       Return in about 2 weeks (around 03/11/2024) for Resident clinic, follow up thryoid/ rash/ HTN.    Gust Rung, DO

## 2024-02-26 NOTE — Assessment & Plan Note (Signed)
Will check CK

## 2024-02-26 NOTE — Assessment & Plan Note (Signed)
 I am most concerned for hyperthyroidism at this visit.  Given her hypertension, weight loss, rash and tachycardia.  Will check thyroid labs

## 2024-02-26 NOTE — Assessment & Plan Note (Signed)
 May need to rule out renal vascular but with her tachycardia and other signs of overt hyperthyroidism we will go ahead and start Coreg 6.25 twice daily today and check thyroid labs.

## 2024-02-27 ENCOUNTER — Telehealth: Payer: Self-pay | Admitting: Internal Medicine

## 2024-02-27 ENCOUNTER — Encounter (HOSPITAL_COMMUNITY): Payer: Self-pay

## 2024-02-27 ENCOUNTER — Inpatient Hospital Stay (HOSPITAL_COMMUNITY)
Admission: AD | Admit: 2024-02-27 | Discharge: 2024-02-29 | DRG: 643 | Disposition: A | Discharge status: Home / Self Care | Source: Ambulatory Visit | Attending: Infectious Diseases | Admitting: Infectious Diseases

## 2024-02-27 DIAGNOSIS — Z79899 Other long term (current) drug therapy: Secondary | ICD-10-CM | POA: Diagnosis not present

## 2024-02-27 DIAGNOSIS — Z87891 Personal history of nicotine dependence: Secondary | ICD-10-CM

## 2024-02-27 DIAGNOSIS — I110 Hypertensive heart disease with heart failure: Secondary | ICD-10-CM | POA: Diagnosis not present

## 2024-02-27 DIAGNOSIS — I5023 Acute on chronic systolic (congestive) heart failure: Secondary | ICD-10-CM | POA: Diagnosis not present

## 2024-02-27 DIAGNOSIS — I309 Acute pericarditis, unspecified: Secondary | ICD-10-CM | POA: Diagnosis not present

## 2024-02-27 DIAGNOSIS — E049 Nontoxic goiter, unspecified: Secondary | ICD-10-CM | POA: Diagnosis present

## 2024-02-27 DIAGNOSIS — I1A0 Resistant hypertension: Secondary | ICD-10-CM | POA: Diagnosis not present

## 2024-02-27 DIAGNOSIS — E0590 Thyrotoxicosis, unspecified without thyrotoxic crisis or storm: Secondary | ICD-10-CM | POA: Diagnosis not present

## 2024-02-27 DIAGNOSIS — R079 Chest pain, unspecified: Secondary | ICD-10-CM | POA: Diagnosis not present

## 2024-02-27 DIAGNOSIS — E0500 Thyrotoxicosis with diffuse goiter without thyrotoxic crisis or storm: Principal | ICD-10-CM | POA: Diagnosis present

## 2024-02-27 LAB — BASIC METABOLIC PANEL
Anion gap: 6 (ref 5–15)
BUN: 25 mg/dL — ABNORMAL HIGH (ref 6–20)
CO2: 27 mmol/L (ref 22–32)
Calcium: 10 mg/dL (ref 8.9–10.3)
Chloride: 105 mmol/L (ref 98–111)
Creatinine, Ser: 0.97 mg/dL (ref 0.44–1.00)
GFR, Estimated: >60 mL/min (ref >60)
Glucose, Bld: 113 mg/dL — ABNORMAL HIGH (ref 70–99)
Potassium: 4.6 mmol/L (ref 3.5–5.1)
Sodium: 138 mmol/L (ref 135–145)

## 2024-02-27 LAB — CBC WITH DIFFERENTIAL/PLATELET
Basophils Absolute: 0 10*3/uL (ref 0.0–0.2)
Basos: 1 %
EOS (ABSOLUTE): 0.2 10*3/uL (ref 0.0–0.4)
Eos: 4 %
Hematocrit: 35.7 % (ref 34.0–46.6)
Hemoglobin: 11.4 g/dL (ref 11.1–15.9)
Immature Grans (Abs): 0 10*3/uL (ref 0.0–0.1)
Immature Granulocytes: 0 %
Lymphocytes Absolute: 1.4 10*3/uL (ref 0.7–3.1)
Lymphs: 30 %
MCH: 23.6 pg — ABNORMAL LOW (ref 26.6–33.0)
MCHC: 31.9 g/dL (ref 31.5–35.7)
MCV: 74 fL — ABNORMAL LOW (ref 79–97)
Monocytes Absolute: 0.6 10*3/uL (ref 0.1–0.9)
Monocytes: 13 %
Neutrophils Absolute: 2.4 10*3/uL (ref 1.4–7.0)
Neutrophils: 52 %
Platelets: 247 10*3/uL (ref 150–450)
RBC: 4.83 x10E6/uL (ref 3.77–5.28)
RDW: 12.5 % (ref 11.7–15.4)
WBC: 4.5 10*3/uL (ref 3.4–10.8)

## 2024-02-27 LAB — IRON,TIBC AND FERRITIN PANEL
Ferritin: 333 ng/mL — ABNORMAL HIGH (ref 15–150)
Iron Saturation: 30 % (ref 15–55)
Iron: 78 ug/dL (ref 27–159)
Total Iron Binding Capacity: 257 ug/dL (ref 250–450)
UIBC: 179 ug/dL (ref 131–425)

## 2024-02-27 LAB — TSH: TSH: <0.005 u[IU]/mL — ABNORMAL LOW (ref 0.450–4.500)

## 2024-02-27 LAB — T4, FREE: Free T4: >7.77 ng/dL — ABNORMAL HIGH (ref 0.82–1.77)

## 2024-02-27 LAB — T3: T3, Total: >651 ng/dL — ABNORMAL HIGH (ref 71–180)

## 2024-02-27 LAB — CK: Total CK: 64 U/L (ref 32–182)

## 2024-02-27 LAB — HCG, QUANTITATIVE, PREGNANCY: hCG, Beta Chain, Quant, S: <1 m[IU]/mL (ref <5)

## 2024-02-27 MED ORDER — LOPERAMIDE HCL 2 MG PO CAPS: 2.0000 mg | ORAL_CAPSULE | ORAL | Status: DC | PRN

## 2024-02-27 MED ORDER — AMLODIPINE BESYLATE 10 MG PO TABS
10.0000 mg | ORAL_TABLET | Freq: Every day | ORAL | Status: DC
Start: 2024-02-28 — End: 2024-02-29
  Administered 2024-02-28 – 2024-02-29 (×2): 10 mg via ORAL
  Filled 2024-02-27 (×2): qty 1

## 2024-02-27 MED ORDER — IBUPROFEN 600 MG PO TABS
600.0000 mg | ORAL_TABLET | Freq: Three times a day (TID) | ORAL | Status: DC
  Administered 2024-02-28 – 2024-02-29 (×5): 600 mg via ORAL
  Filled 2024-02-27 (×5): qty 1

## 2024-02-27 MED ORDER — LISINOPRIL 10 MG PO TABS
10.0000 mg | ORAL_TABLET | Freq: Every day | ORAL | Status: DC
  Administered 2024-02-28 – 2024-02-29 (×2): 10 mg via ORAL
  Filled 2024-02-27 (×2): qty 1

## 2024-02-27 MED ORDER — METHIMAZOLE 10 MG PO TABS
20.0000 mg | ORAL_TABLET | Freq: Two times a day (BID) | ORAL | Status: DC
  Administered 2024-02-27 – 2024-02-29 (×4): 20 mg via ORAL
  Filled 2024-02-27 (×5): qty 2

## 2024-02-27 MED ORDER — SPIRONOLACTONE 25 MG PO TABS
25.0000 mg | ORAL_TABLET | Freq: Every day | ORAL | Status: DC
  Administered 2024-02-28 – 2024-02-29 (×2): 25 mg via ORAL
  Filled 2024-02-27 (×2): qty 1

## 2024-02-27 MED ORDER — HYDROXYZINE HCL 25 MG PO TABS
25.0000 mg | ORAL_TABLET | Freq: Three times a day (TID) | ORAL | Status: DC | PRN
  Administered 2024-02-27: 25 mg via ORAL
  Filled 2024-02-27 (×2): qty 1

## 2024-02-27 MED ORDER — ATENOLOL 25 MG PO TABS
25.0000 mg | ORAL_TABLET | Freq: Every day | ORAL | Status: DC
Start: 2024-02-27 — End: 2024-02-28
  Administered 2024-02-27 – 2024-02-28 (×2): 25 mg via ORAL
  Filled 2024-02-27 (×2): qty 1

## 2024-02-27 MED ORDER — COLCHICINE 0.6 MG PO TABS
0.6000 mg | ORAL_TABLET | Freq: Every day | ORAL | Status: DC
  Administered 2024-02-28 – 2024-02-29 (×2): 0.6 mg via ORAL
  Filled 2024-02-27 (×2): qty 1

## 2024-02-27 NOTE — Plan of Care (Signed)

## 2024-02-27 NOTE — Progress Notes (Signed)
 There was a consult for placing a PIV access. Patient was scare of length of catheter and she had experienced when she had a long length catheter with bad pain. Patient refused to place a PIV access. Informed patient's RN regarding this matter and no blood draw. Patient's RN will talk to patient as well as phlebotomist for blood work. HS [employer]

## 2024-02-27 NOTE — Progress Notes (Addendum)
 Paged by nursing that patient was having 5/10 chest pain. Saw patient at bedside. She describes substernal aching pain radiating to her neck and L arm, which has been intermittent for the last 3 weeks. Pain is relieved by sitting forward in bed. She feels jittery, anxious, and became emotional during the interview. On exam she was tachycardic with normal rhythm, lungs clear to auscultation bilaterally.   I will give hydroxyzine now and get an EKG. If the EKG shows diffuse ST elevation we will treat empirically for pericarditis with NSAIDs.   Update: EKG reviewed, shows ST elevations in anterior leads and large P waves in lead II. We will get troponins, ESR/CRP, and start empiric ibuprofen 600mg  TID and colchicine 0.6mg  daily for pericarditis  Monna Fam, MD

## 2024-02-27 NOTE — Telephone Encounter (Signed)
 Called patient and notified we have a bed request and they should call hopefully this afternoon.  She has not yet started the coreg and I told her to go ahead and do so.  She will monitor for fever and present to the ED if she has a fever or feeling worse before she gets the call for direct admission.

## 2024-02-27 NOTE — Telephone Encounter (Signed)
 Called this morning. Discussed as I suspected her symptoms are due to hyperthyroidism and hormone levels are very high.  He is mildly anxious, was tachycardic and hypertensive yesterday, continues to deny fever, shortness of breath but does have some ankle swelling.  Her score for thyroid storm would be 25 (low but suggestive of possible impending storm) therefore I recommended hospital admission for initation of treatment.  Will see if I can get a bed for a direct admit otherwise will need to send her to ED.

## 2024-02-27 NOTE — Progress Notes (Signed)
 Pt refused to have IV unless there is IV meds/ procedures. Pt agreed to have blood test with the laboratory.

## 2024-02-27 NOTE — H&P (Addendum)
 Date: 02/27/2024               Patient Name:  Angela Manning MRN: [id number]  DOB: March 07, 1986 Age / Sex: 38 y.o., female   PCP: Reymundo Poll, MD         Medical Service: Internal Medicine Teaching Service         Attending Physician: Dr. Ginnie Smart, MD      First Contact: Dr. Carmina Miller, DO Pager [phone]    Second Contact: Dr. Marrianne Mood, MD Pager [phone]         After Hours (After 5p/  First Contact Pager: [phone]  weekends / holidays): Second Contact Pager: [phone]   SUBJECTIVE   Chief Complaint: Weakness in legs and Growth on neck   History of Present Illness:  This is a 38 year old female with past medical history of resistant hypertension who presented to the hospital after being admitted directly from clinic.  Patient presented to the clinic on 02/26/2024 with concerns of a rash as well as a swollen neck.  She also reports having intermittent chest pain.  Clinic doctor noted patient had enlargement of her thyroid and ordered thyroid studies.  Patient studies showed evidence of hyperthyroidism and patient was referred to hospital for admission for further evaluation and management.  When patient arrived to floor, we approached the patient to discuss more.  She states this all started about 1 month ago.  She reported that about 1 month ago she noticed weakness in her legs.  She reports she was dancing at her church and noticed that when she got down, she could not get back up.  She also reports having intermittent weakness of her lower extremity especially on hip flexion over the past month.  About 3 weeks ago she developed a rash that initially started on her back and went upper back.  She described these as little spots that are dark in nature that have started to improve now.  During this time she also reported having neck enlargement which she did not realize was her thyroid at the time.  She thought it would get better.  Patient is currently a Consulting civil engineer in  college and over the past 2 to 3 weeks reported having concerns of achy type chest pain on exertion that radiates to her left arm.  She endorses that this pain improves with leaning forward.  Breathing in cold air makes her chest pain worse.  She also endorses 1 week history of loose stools over the past 5-6 days.  She notes that on Monday, February 23, 2024 she had an episode of vomiting, but has resolved since.  She denies any fevers, chills, nausea today.  She does endorse having some headaches.  She denies any recent antibiotic use.  She denies any palpitations.  She denies any recent sickness.  With concern for the symptoms, patient made a clinic appointment to be seen in clinic.   Of note, patient did have an emergency department visit on 02/04/2024 in which patient reported having episodes of dizziness and weakness.  At that time she states she became lightheaded and dizzy and had to sit down during work.  Workup in the ED showed she had weakness at hip flexion at that time as well.  Patient had CK and magnesium levels which were not remarkable for anything.  Troponins were flat.  Patient had a CT scan which showed diffuse enlargement of the thyroid without nodularity.  Patient was to follow-up outpatient for further evaluation.  Past Medical History Past Medical History:  Diagnosis Date   Bacterial vaginosis    Hypertension    Hypertension affecting pregnancy, antepartum 10/27/2019   Ovarian cyst    Preeclampsia 09/10/2023   Pregnancy induced hypertension    Primary hypertension 03/03/2020     Meds:  Amlodipine 10 mg daily Carvedilol 6.25 mg twice daily Lisinopril 10 mg daily Spironolactone 25 mg daily  Past Surgical History  Past Surgical History:  Procedure Laterality Date   DILATION AND EVACUATION N/A 02/21/2013   Procedure: DILATATION AND EVACUATION;  Surgeon: Reva Bores, MD;  Location: [facility];  Service: Gynecology;  Laterality: N/A;    Social:  Lives with: 4 sons, ages  are 22, 29, 71, 27 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   OBJECTIVE:   Physical Exam: Blood pressure (!) 154/98, pulse (!) 107, temperature 98.1 F (36.7 C), temperature source Oral, last menstrual period 11/15/2023, SpO2 100%.   Constitutional: well-appearing, lying in bed, in no acute distress HENT: goiter, no obvious nodularity, mild TTP Cardiovascular: tachycardic, regular rhythm, mild LEE most notably in feet Pulmonary/Chest: normal work of breathing on room air, lungs clear to auscultation bilaterally Abdominal: mild suprapubic tenderness MSK: normal bulk and tone Neurological: alert & oriented x 3, 2/5 LE strength, 5/5 UE Skin: warm and dry Psych: normal mood and behavior   Labs: CBC    Component Value Date/Time   WBC 4.5 02/26/2024 1023   WBC 8.6 02/04/2024 1321   RBC 4.83 02/26/2024 1023   RBC 4.71 02/04/2024 1321   HGB 11.4 02/26/2024 1023   HCT 35.7 02/26/2024 1023   PLT 247 02/26/2024 1023   MCV 74 (L) 02/26/2024 1023   MCH 23.6 (L) 02/26/2024 1023   MCH 24.6 (L) 02/04/2024 1321   MCHC 31.9 02/26/2024 1023   MCHC 31.4 02/04/2024 1321   RDW 12.5 02/26/2024 1023   LYMPHSABS 1.4 02/26/2024 1023   MONOABS 1.1 (H) 12/30/2023 2230   EOSABS 0.2 02/26/2024 1023   BASOSABS 0.0 02/26/2024 1023     CMP     Component Value Date/Time   NA 138 02/04/2024 1321   NA 138 09/18/2023 1158   K 4.5 02/04/2024 1321   CL 106 02/04/2024 1321   CO2 22  02/04/2024 1321   GLUCOSE 80 02/04/2024 1321   BUN 20 02/04/2024 1321   BUN 11 09/18/2023 1158   CREATININE 0.66 02/04/2024 1321   CALCIUM 10.3 02/04/2024 1321   PROT 6.3 (L) 01/04/2024 1753   ALBUMIN 3.3 (L) 01/04/2024 1753   AST 31 01/04/2024 1753   ALT 39 01/04/2024 1753   ALKPHOS 94 01/04/2024 1753   BILITOT 0.5 01/04/2024 1753   GFRNONAA >60 02/04/2024 1321   GFRAA >60 08/23/2019 0444    Imaging: Ultrasound thyroid pending  EKG: EKG pending   Previous EKG noted on 02/06/2024 showing sinus tachycardia.  Some J-point elevation in inferior leads as well as some J-point elevation in anterior leads.  No obvious ST segment elevations.  When  compared to previous EKG, J-point's seem new.  ASSESSMENT & PLAN:   Assessment & Plan by Problem: Principal Problem:   Hyperthyroidism Active Problems:   Thyroid enlargement   Resistant hypertension   Chest pain   CHARRIE MCCONNON is a 38 y.o. female with past medical history of treatment resistant hypertension, heart failure with mildly reduced ejection fraction who presents with concerns of enlargement of her neck as well as weakness and admitted for further evaluation and management of hyperthyroidism.  #Hyperthyroidism Patient evaluated bedside today.  Patient is tachycardic as well as hypertensive.  Patient has weakness noted on hip flexion specifically.  Knee extension, knee flexion, arm abduction, arm flexion bilaterally is intact.  TSH severely low as well as elevated free T4 and T3.  Labs consistent with hyperthyroidism.  Given diffuse enlargement on CT head and neck, there is some concern for Graves' disease.  Will work this up.  No obvious nodules noted but will try to further characterize this diffuse enlargement with a thyroid ultrasound.  Given weakness, I think this is likely related to hyperthyroidism, specifically caused by thyrotoxic myopathy. However, if this does not improve with treatment for hyperthyroidism, could  potentially pursue workup for myasthenia gravis if maybe this is a thymoma but less likely. Less likely polymyalgia rheumatica. If does not improve, could obtain CRP. -Start methimazole 20 mg twice daily -Start atenolol 25 mg daily, titrate up to target pulse of 60 to 90 bpm as blood pressure -Follow-up thyroid receptor antibodies -Follow-up thyroid ultrasound -Monitor on telemetry -Defer steroids for now given patient is clinically stable, if patient does decompensate I do recommend starting steroids  #Heart failure with mildly reduced ejection fraction #Chest pain Most recent echocardiogram in 02/02/2024 showing ejection fraction 50 to 55%.  No regional wall motion abnormalities. Chest pain does sound anginal, but patient seems to young to have acute MI and patient is currently chest pain-free.  Do not see benefit in checking troponin at this time.  Will obtain EKG to further characterize.  Given history, I do wonder if patient has pericarditis. -Obtain echocardiogram -Obtain EKG  #Hypertension Patient currently with elevated blood pressures into the 150s systolic.  Will restart home medications.  Will also add on atenolol augment thionamide. -Monitor blood pressure closely -Restart lisinopril 10 mg daily -Restart spironolactone 25 mg daily -Start atenolol 25 mg daily -I do anticipate if thyroid etiology improves, patient blood pressure will improve  Diet: Normal VTE: SCDs IVF: None,None Code: Full  Prior to Admission Living Arrangement: Home, living with family Anticipated Discharge Location: Home Barriers to Discharge: Clinical improvement  Dispo: Admit patient to Inpatient with expected length of stay greater than 2 midnights.  Carmina Miller, DO 02/27/2024, 10:20 AM  Pager: [phone] After 5pm or weekend: [phone]

## 2024-02-27 NOTE — Progress Notes (Signed)
 Made MD aware of the patients vitals and a need for pain meds. MD came to the bedside to evaluate pt. EKG was requested and labs. Also, made MD aware that the patient refused IV unless medically or procedure necessary.

## 2024-02-27 NOTE — Hospital Course (Addendum)
 Principal Problem:   Hyperthyroidism Active Problems:   Thyroid enlargement   Resistant hypertension   Chest pain   Acute pericarditis  Resolved Problems:   * No resolved hospital problems. *  Consults:***  Procedures:***  Follow-up items:***

## 2024-02-27 NOTE — Progress Notes (Addendum)
 Patient has arrived to the floor, will come see shortly for admission. Full H and P to follow

## 2024-02-28 ENCOUNTER — Inpatient Hospital Stay (HOSPITAL_COMMUNITY)

## 2024-02-28 DIAGNOSIS — R079 Chest pain, unspecified: Secondary | ICD-10-CM | POA: Diagnosis not present

## 2024-02-28 DIAGNOSIS — E0590 Thyrotoxicosis, unspecified without thyrotoxic crisis or storm: Secondary | ICD-10-CM | POA: Diagnosis not present

## 2024-02-28 LAB — BASIC METABOLIC PANEL
Anion gap: 7 (ref 5–15)
BUN: 27 mg/dL — ABNORMAL HIGH (ref 6–20)
CO2: 22 mmol/L (ref 22–32)
Calcium: 10 mg/dL (ref 8.9–10.3)
Chloride: 109 mmol/L (ref 98–111)
Creatinine, Ser: 0.9 mg/dL (ref 0.44–1.00)
GFR, Estimated: >60 mL/min (ref >60)
Glucose, Bld: 127 mg/dL — ABNORMAL HIGH (ref 70–99)
Potassium: 4.2 mmol/L (ref 3.5–5.1)
Sodium: 138 mmol/L (ref 135–145)

## 2024-02-28 LAB — ECHOCARDIOGRAM COMPLETE
AR max vel: 2.21 cm2
AV Peak grad: 13.7 mmHg
Ao pk vel: 1.85 m/s
Area-P 1/2: 4.6 cm2
MV VTI: 2.2 cm2
S' Lateral: 3.7 cm

## 2024-02-28 LAB — TROPONIN I (HIGH SENSITIVITY)
Troponin I (High Sensitivity): 155 ng/L (ref <18)
Troponin I (High Sensitivity): 133 ng/L (ref <18)

## 2024-02-28 LAB — C-REACTIVE PROTEIN: CRP: 0.6 mg/dL (ref <1.0)

## 2024-02-28 LAB — SEDIMENTATION RATE: Sed Rate: 6 mm/h (ref 0–22)

## 2024-02-28 MED ORDER — ATENOLOL 25 MG PO TABS
37.5000 mg | ORAL_TABLET | Freq: Every day | ORAL | Status: DC
  Administered 2024-02-29: 37.5 mg via ORAL
  Filled 2024-02-28: qty 2

## 2024-02-28 NOTE — Plan of Care (Signed)
  Problem: Education: Goal: Knowledge of General Education information will improve Description: Including pain rating scale, medication(s)/side effects and non-pharmacologic comfort measures Outcome: Progressing   Problem: Clinical Measurements: Goal: Ability to maintain clinical measurements within normal limits will improve Outcome: Progressing   Problem: Nutrition: Goal: Adequate nutrition will be maintained Outcome: Progressing   Problem: Coping: Goal: Level of anxiety will decrease Outcome: Progressing   Problem: Pain Managment: Goal: General experience of comfort will improve and/or be controlled Outcome: Progressing

## 2024-02-28 NOTE — Progress Notes (Signed)
 Subjective Endorses mild intermittent chest pain with radiation to left shoulder. Denies diaphoresis, n/v.  Physical exam Blood pressure (!) 145/96, pulse 93, temperature 97.9 F (36.6 C), temperature source Oral, resp. rate 18, last menstrual period 11/15/2023, SpO2 100%.  Constitutional: well-appearing, lying in bed, in no acute distress HENT: goiter, no obvious nodularity Cardiovascular: tachycardic, regular rhythm, mild LEE most notably in feet Pulmonary/Chest: normal work of breathing on room air, lungs clear to auscultation bilaterally Skin: warm and dry Psych: normal mood and behavior  Weight change:    Intake/Output Summary (Last 24 hours) at 02/28/2024 1134 Last data filed at 02/28/2024 1011 Gross per 24 hour  Intake 358 ml  Output --  Net 358 ml   Net IO Since Admission: 358 mL [02/28/24 1134]  Labs, images, and other studies    Latest Ref Rng & Units 02/26/2024   10:23 AM 02/04/2024    1:21 PM 12/30/2023   10:30 PM  CBC  WBC 3.4 - 10.8 x10E3/uL 4.5  8.6  7.0   Hemoglobin 11.1 - 15.9 g/dL 40.9  81.1  91.4   Hematocrit 34.0 - 46.6 % 35.7  37.0  36.3   Platelets 150 - 450 x10E3/uL 247  263  277        Latest Ref Rng & Units 02/28/2024    4:22 AM 02/27/2024    9:29 PM 02/04/2024    1:21 PM  BMP  Glucose 70 - 99 mg/dL 782  956  80   BUN 6 - 20 mg/dL 27  25  20    Creatinine 0.44 - 1.00 mg/dL 2.13  0.86  5.78   Sodium 135 - 145 mmol/L 138  138  138   Potassium 3.5 - 5.1 mmol/L 4.2  4.6  4.5   Chloride 98 - 111 mmol/L 109  105  106   CO2 22 - 32 mmol/L 22  27  22    Calcium 8.9 - 10.3 mg/dL 46.9  62.9  52.8      Assessment and plan Hospital day 1  Angela Manning is a 38 y.o. female with past medical history of treatment resistant hypertension, heart failure with mildly reduced ejection fraction who presents with concerns of enlargement of her neck as well as weakness and admitted for further evaluation and management of hyperthyroidism.    #Hyperthyroidism US Thyroid and TRAb pending. Remains mildly tachycardic but this has improved. -Continue methimazole 20 mg BID -Increase Atenolol from 25 mg to 37.5 daily, titrate up to target pulse of 60 to 90 bpm as blood pressure allows -Follow-up thyroid receptor antibodies -Follow-up thyroid ultrasound -Monitor on telemetry -Defer steroids for now given patient is clinically stable, if patient does decompensate I do recommend starting steroids   #Chest pain  #Concern for Pericarditis #Elevated Troponin Most recent echocardiogram in 02/02/2024 showing ejection fraction 50 to 55% with no regional wall motion abnormalities. Repeat TTE today showed EF of 55-60% without wall motion abnormalities. The only difference is that "trivial MR is now mild to moderate MR". Chest pain worsened last night. EKG showed diffuse STE most notably in anterior leads. Troponin  155 > 133. ESR/CRP normal. Chest pain improved today and she states symptoms improve when leaning forward.  Given association with hyperthyroidism and pericarditis, will start colchicine and NSAIDs.  -Colchicine 0.6 mg daily and Advil 600 mg TID   #Hypertension BP Stable -Continue home lisinopril 10 mg daily, spironolactone 25  mg daily, Amlodipine 10 mg daily, and Atenolol for reasons above. -Trend BMP   Diet: Normal VTE: SCDs IVF: None,None Code: Full  Carmina Miller, DO 02/28/2024, 11:34 AM  Pager: [phone] After 5pm or weekend: [phone]

## 2024-02-28 NOTE — Progress Notes (Signed)
   02/28/24 0136  Provider Notification  Provider Name/Title Monna Fam, Md  Date Provider Notified 02/28/24  Time Provider Notified [phone]  Method of Notification Page  Notification Reason Critical Result  Test performed and critical result Troponin 155  Date Critical Result Received 02/28/24  Time Critical Result Received 0130  Provider response Other (Comment) (Provider communicating w/ Assigned RN via chat)

## 2024-02-28 NOTE — Plan of Care (Signed)

## 2024-02-28 NOTE — Progress Notes (Signed)
 Made MD aware of lab value and gave an update of the current status of the patient.

## 2024-02-29 ENCOUNTER — Other Ambulatory Visit: Payer: Self-pay | Admitting: Student

## 2024-02-29 DIAGNOSIS — I309 Acute pericarditis, unspecified: Secondary | ICD-10-CM | POA: Insufficient documentation

## 2024-02-29 DIAGNOSIS — E0500 Thyrotoxicosis with diffuse goiter without thyrotoxic crisis or storm: Principal | ICD-10-CM

## 2024-02-29 DIAGNOSIS — E0590 Thyrotoxicosis, unspecified without thyrotoxic crisis or storm: Secondary | ICD-10-CM

## 2024-02-29 LAB — BASIC METABOLIC PANEL
Anion gap: 8 (ref 5–15)
BUN: 23 mg/dL — ABNORMAL HIGH (ref 6–20)
CO2: 23 mmol/L (ref 22–32)
Calcium: 10 mg/dL (ref 8.9–10.3)
Chloride: 110 mmol/L (ref 98–111)
Creatinine, Ser: 0.73 mg/dL (ref 0.44–1.00)
GFR, Estimated: >60 mL/min (ref >60)
Glucose, Bld: 127 mg/dL — ABNORMAL HIGH (ref 70–99)
Potassium: 4.2 mmol/L (ref 3.5–5.1)
Sodium: 141 mmol/L (ref 135–145)

## 2024-02-29 LAB — THYROTROPIN RECEPTOR AUTOABS: Thyrotropin Receptor Ab: 18.7 IU/L — ABNORMAL HIGH (ref 0.00–1.75)

## 2024-02-29 MED ORDER — ATENOLOL 25 MG PO TABS: 25.0000 mg | ORAL_TABLET | Freq: Every day | ORAL | 2 refills | Status: DC

## 2024-02-29 MED ORDER — ACETAMINOPHEN 500 MG PO TABS
1000.0000 mg | ORAL_TABLET | Freq: Once | ORAL | Status: AC
  Administered 2024-02-29: 1000 mg via ORAL
  Filled 2024-02-29: qty 2

## 2024-02-29 MED ORDER — COLCHICINE 0.6 MG PO TABS: 0.6000 mg | ORAL_TABLET | Freq: Two times a day (BID) | ORAL | 0 refills | Status: DC

## 2024-02-29 MED ORDER — DICLOFENAC SODIUM 1 % EX GEL
2.0000 g | Freq: Four times a day (QID) | CUTANEOUS | Status: DC | PRN
  Filled 2024-02-29: qty 100

## 2024-02-29 MED ORDER — METHIMAZOLE 10 MG PO TABS: 20.0000 mg | ORAL_TABLET | Freq: Two times a day (BID) | ORAL | 2 refills | Status: DC

## 2024-02-29 MED ORDER — IBUPROFEN 600 MG PO TABS: 600.0000 mg | ORAL_TABLET | Freq: Three times a day (TID) | ORAL | 0 refills | Status: DC

## 2024-02-29 NOTE — Progress Notes (Signed)
 Patient complained of chest pain and nausea. at level 5,not radiating to the shoulders.MD made aware thru secure chat. Awaiting for further orders.

## 2024-02-29 NOTE — Plan of Care (Signed)
   Problem: Safety: Goal: Ability to remain free from injury will improve Outcome: Progressing

## 2024-02-29 NOTE — Discharge Summary (Signed)
 Name: Angela Manning MRN: [id number] DOB: [birth year] 38 y.o. PCP: Reymundo Poll, MD  Date of Admission: 02/27/2024  3:31 PM Date of Discharge: 02/29/2024 6:12 PM Attending Physician: No att. providers found  Discharge diagnosis: Principal Problem:   Hyperthyroidism Active Problems:   Thyroid enlargement   Resistant hypertension   Chest pain   Acute pericarditis  Resolved Problems:   * No resolved hospital problems. *   Discharge medications: Allergies as of 02/29/2024       Reactions   Flagyl [metronidazole] Rash, Other (See Comments)   "Breaks out in blisters"        Medication List     STOP taking these medications    carvedilol 6.25 MG tablet Commonly known as: COREG       TAKE these medications    amLODipine 10 MG tablet Commonly known as: NORVASC Take 1 tablet (10 mg total) by mouth daily.   atenolol 25 MG tablet Commonly known as: TENORMIN Take 1 tablet (25 mg total) by mouth daily. Start taking on: March 01, 2024   colchicine 0.6 MG tablet Take 1 tablet (0.6 mg total) by mouth 2 (two) times daily.   ibuprofen 600 MG tablet Commonly known as: ADVIL Take 1 tablet (600 mg total) by mouth 3 (three) times daily.   lisinopril 10 MG tablet Commonly known as: ZESTRIL Take 1 tablet (10 mg total) by mouth daily.   methimazole 10 MG tablet Commonly known as: TAPAZOLE Take 2 tablets (20 mg total) by mouth 2 (two) times daily.   spironolactone 25 MG tablet Commonly known as: Aldactone Take 1 tablet (25 mg total) by mouth daily.        Follow-up appointments:  Follow-up Information     Talmage Coin, MD Follow up.   Specialty: Endocrinology Contact information: [address] [place] [address] [phone]         Reymundo Poll, MD Follow up.   Specialty: Internal Medicine Contact information: [address] [place] [address] [phone]                 Disposition and recommendations: [name] is a 38 y.o. year old hospitalized for thyrotoxicosis, discharged on hospital day 1 in good condition.  Thyrotoxicosis due to Graves' disease TSH essentially undetectable, free T4 is above the detection limit of our assay.  Diffuse goiter, hypervascularity on ultrasound.  Thyrotropin antibodies are positive, confirming diagnosis of Graves' disease. - Continue methimazole 20 mg twice daily - Continue atenolol 25 mg daily - Endocrinology follow-up  Pericarditis Pain fairly typical for pericarditis, EKG findings corroborate.  ACS was ruled out.  Echo was normal.  She was started on ibuprofen and colchicine. - Continue colchicine 0.6 mg twice daily for 3 months - Continue ibuprofen 600 mg 3 times daily for 2 weeks  Hospital course: Principal Problem:   Thyrotoxicosis due to Graves' disease  Admitted from her internal medicine clinic after workup for leg weakness and thyroid enlargement revealed thyrotoxicosis.  She was treated with methimazole and atenolol.  Leg weakness was attributed to thyroid myopathy.  She felt better by day of discharge.  She was discharged on methimazole, atenolol, and endocrinology referral for consideration of ablative therapy.  Active Problems:   Resistant hypertension Her outpatient amlodipine, lisinopril, and spironolactone were continued.  Her carvedilol was discontinued in favor of atenolol.    Chest pain   Acute pericarditis Reported sharp chest pain, intermittent for the last 3 weeks that  feels better when leaning forward.  Mild and stable troponin elevation.  EKG shows some anterior lead ST elevation.  Diagnosis of pericarditis was made and therapy was started with colchicine and ibuprofen.  Discharge exam: Feels better.  Chest is still sore.  Still anxious and upset.   Blood pressure (!) 152/93, pulse 91, temperature 97.6 F (36.4 C), temperature source Oral, resp. rate 18, last menstrual period 11/15/2023, SpO2 100%.  No distress, but  tearful at times Heart rate is normal, rhythm is regular Breathing comfortably on room air Skin is kind of warm and clammy  Pertinent studies and procedures: Recent Results (from the past 95284 hours)  ECHOCARDIOGRAM COMPLETE   Collection Time: 02/28/24  9:37 AM  Result Value   BP 145/96   S' Lateral 3.70   AR max vel 2.21   AV Peak grad 13.7   Ao pk vel 1.85   Area-P 1/2 4.60   MV VTI 2.20   Est EF 55 - 60%   Narrative      ECHOCARDIOGRAM REPORT       Patient Name:   Angela Manning Date of Exam: 02/28/2024 Medical Rec #:  [id number]       Height:       67.0 in Accession #:    [id number]      Weight:       145.2 lb Date of Birth:  12-Aug-1986        BSA:          1.765 m Patient Age:    38 years        BP:           145/96 mmHg Patient Gender: F               HR:           100 bpm. Exam Location:  Inpatient  Procedure: 2D Echo, Cardiac Doppler and Color Doppler (Both Spectral and Color            Flow Doppler were utilized during procedure).  Indications:    Chest Pain   History:        Patient has prior history of Echocardiogram examinations.                 Signs/Symptoms:Chest Pain; Risk Factors:Hypertension and Former                 Smoker.   Sonographer:    Lamont Snowball Referring Phys: [id number] JEFFREY C HATCHER  IMPRESSIONS    1. Left ventricular ejection fraction, by estimation, is 55 to 60%. The left ventricle has normal function. The left ventricle has no regional wall motion abnormalities. Left ventricular diastolic parameters were normal.  2. Right ventricular systolic function is normal. The right ventricular size is normal.  3. Left atrial size was mildly dilated.  4. The mitral valve is grossly normal. Mild to moderate mitral valve regurgitation. No evidence of mitral stenosis.  5. The aortic valve is tricuspid. Aortic valve regurgitation is not visualized. No aortic stenosis is present.  6. The inferior vena cava is normal in size with <50% respiratory  variability, suggesting right atrial pressure of 8 mmHg.  Comparison(s): A prior study was performed on 02/02/2024. Trivial MR is now Mild to moderate MR.  FINDINGS  Left Ventricle: Left ventricular ejection fraction, by estimation, is 55 to 60%. The left ventricle has normal function. The left ventricle has no regional wall motion abnormalities. The left ventricular internal cavity size  was normal in size. There is  borderline left ventricular hypertrophy. Left ventricular diastolic parameters were normal.  Right Ventricle: The right ventricular size is normal. No increase in right ventricular wall thickness. Right ventricular systolic function is normal.  Left Atrium: Left atrial size was mildly dilated.  Right Atrium: Right atrial size was normal in size.  Pericardium: There is no evidence of pericardial effusion.  Mitral Valve: The mitral valve is grossly normal. There is mild thickening of the mitral valve leaflet(s). Normal mobility of the mitral valve leaflets. Mild to moderate mitral valve regurgitation. No evidence of mitral valve stenosis. MV peak gradient,  9.4 mmHg. The mean mitral valve gradient is 4.0 mmHg.  Tricuspid Valve: The tricuspid valve is normal in structure. Tricuspid valve regurgitation is trivial. No evidence of tricuspid stenosis.  Aortic Valve: The aortic valve is tricuspid. Aortic valve regurgitation is not visualized. No aortic stenosis is present. Aortic valve peak gradient measures 13.7 mmHg.  Pulmonic Valve: The pulmonic valve was normal in structure. Pulmonic valve regurgitation is mild. No evidence of pulmonic stenosis.  Aorta: The aortic root and ascending aorta are structurally normal, with no evidence of dilitation.  Venous: The inferior vena cava is normal in size with less than 50% respiratory variability, suggesting right atrial pressure of 8 mmHg.  IAS/Shunts: There is redundancy of the interatrial septum. The atrial septum is grossly normal.     LEFT VENTRICLE PLAX 2D LVIDd:         4.70 cm   Diastology LVIDs:         3.70 cm   LV e' medial:    13.20 cm/s LV PW:         1.00 cm   LV E/e' medial:  9.9 LV IVS:        1.00 cm   LV e' lateral:   14.30 cm/s LVOT diam:     2.00 cm   LV E/e' lateral: 9.2 LV SV:         64 LV SV Index:   36 LVOT Area:     3.14 cm    RIGHT VENTRICLE             IVC RV Basal diam:  2.90 cm     IVC diam: 1.80 cm RV S prime:     14.10 cm/s TAPSE (M-mode): 3.0 cm  LEFT ATRIUM             Index        RIGHT ATRIUM           Index LA diam:        3.60 cm 2.04 cm/m   RA Area:     12.70 cm LA Vol (A2C):   64.9 ml 36.77 ml/m  RA Volume:   27.20 ml  15.41 ml/m LA Vol (A4C):   70.9 ml 40.17 ml/m LA Biplane Vol: 69.2 ml 39.21 ml/m  AORTIC VALVE AV Area (Vmax): 2.21 cm AV Vmax:        185.00 cm/s AV Peak Grad:   13.7 mmHg LVOT Vmax:      130.00 cm/s LVOT Vmean:     84.900 cm/s LVOT VTI:       0.203 m   AORTA Ao Root diam: 2.80 cm Ao Asc diam:  3.00 cm  MITRAL VALVE MV Area (PHT): 4.60 cm     SHUNTS MV Area VTI:   2.20 cm     Systemic VTI:  0.20 m MV Peak grad:  9.4 mmHg  Systemic Diam: 2.00 cm MV Mean grad:  4.0 mmHg MV Vmax:       1.53 m/s MV Vmean:      97.1 cm/s MV Decel Time: 165 msec MV E velocity: 131.00 cm/s MV A velocity: 114.00 cm/s MV E/A ratio:  1.15  Sunit Tolia Electronically signed by Tessa Lerner Signature Date/Time: 02/28/2024/10:07:20 AM       Final     *Note: Due to a large number of results and/or encounters for the requested time period, some results have not been displayed. A complete set of results can be found in Results Review.    Imaging Orders         US THYROID     Lab Orders         Thyrotropin receptor autoabs         hCG, quantitative, pregnancy         Basic metabolic panel         Basic metabolic panel         Sedimentation rate         C-reactive protein         Basic metabolic panel     Discharge Instructions:   Discharge  Instructions      To [name] or their caretakers,  They were admitted to [facility] on 02/27/2024 for evaluation and treatment of:  Principal Problem:   Hyperthyroidism Active Problems:   Thyroid enlargement   Resistant hypertension   Chest pain   Acute pericarditis  Resolved Problems:   * No resolved hospital problems. *  The evaluation suggested overactive thyroid, likely due to a condition called Graves' disease. They were treated medicines to decrease thyroid activity and improve symptoms.  They were discharged from the hospital on 02/29/24. I recommend the following after leaving the hospital:   Take any medicines as prescribed, including methimazole and atenolol.  You will stop taking carvedilol, as your new medicine called atenolol is very similar.  Follow-up with the internal medicine center soon as possible.  I have requested a referral for an urgent endocrinologist appointment for treatment of your thyroid disorder.  For your chest pain, continue taking ibuprofen and colchicine.  For questions about your care plan, until you are able to see your primary doctor: Call [phone]. Dial 0 for the operator. Ask for the internal medicine resident on call.  Marrianne Mood MD 02/29/2024, 1:00 PM      Marrianne Mood MD 02/29/2024, 6:12 PM

## 2024-02-29 NOTE — Discharge Instructions (Addendum)
 To [name] or their caretakers,  They were admitted to [facility] on 02/27/2024 for evaluation and treatment of:  Principal Problem:   Hyperthyroidism Active Problems:   Thyroid enlargement   Resistant hypertension   Chest pain   Acute pericarditis  Resolved Problems:   * No resolved hospital problems. *  The evaluation suggested overactive thyroid, likely due to a condition called Graves' disease. They were treated medicines to decrease thyroid activity and improve symptoms.  They were discharged from the hospital on 02/29/24. I recommend the following after leaving the hospital:   Take any medicines as prescribed, including methimazole and atenolol.  You will stop taking carvedilol, as your new medicine called atenolol is very similar.  Follow-up with the internal medicine center soon as possible.  I have requested a referral for an urgent endocrinologist appointment for treatment of your thyroid disorder.  For your chest pain, continue taking ibuprofen and colchicine.  For questions about your care plan, until you are able to see your primary doctor: Call [phone]. Dial 0 for the operator. Ask for the internal medicine resident on call.  Marrianne Mood MD 02/29/2024, 1:00 PM

## 2024-03-01 ENCOUNTER — Other Ambulatory Visit: Payer: Self-pay | Admitting: Student

## 2024-03-01 ENCOUNTER — Telehealth: Payer: Self-pay

## 2024-03-01 DIAGNOSIS — E0500 Thyrotoxicosis with diffuse goiter without thyrotoxic crisis or storm: Secondary | ICD-10-CM

## 2024-03-01 NOTE — Progress Notes (Signed)
 Orders for free T4 and T3 placed for follow-up, either lab only visit or in person visit.  Marrianne Mood MD 03/01/2024, 11:17 AM

## 2024-03-01 NOTE — Telephone Encounter (Signed)
 Spoke with patient, she will keep her appt 03/03/2024 for hfu and labs.

## 2024-03-01 NOTE — Telephone Encounter (Signed)
 Copied from CRM [id number]. Topic: Appointments - Appointment Scheduling >> Mar 01, 2024  1:20 PM Tiffany H wrote: Patient/patient representative is calling to schedule an appointment. Refer to attachments for appointment information.   Attempted to schedule. Workspace frozen and shut down. Unable to reconnect with patient. Dr. Benito Mccreedy ordered labs for patient. Patient also needs to be scheduled for discharge follow up.

## 2024-03-02 ENCOUNTER — Other Ambulatory Visit: Payer: Self-pay

## 2024-03-02 ENCOUNTER — Emergency Department (HOSPITAL_COMMUNITY)
Admission: EM | Admit: 2024-03-02 | Discharge: 2024-03-02 | Disposition: A | Discharge status: Home / Self Care | Attending: Emergency Medicine | Admitting: Emergency Medicine

## 2024-03-02 DIAGNOSIS — Z3202 Encounter for pregnancy test, result negative: Secondary | ICD-10-CM | POA: Diagnosis not present

## 2024-03-02 DIAGNOSIS — R1013 Epigastric pain: Secondary | ICD-10-CM | POA: Diagnosis present

## 2024-03-02 DIAGNOSIS — K2900 Acute gastritis without bleeding: Secondary | ICD-10-CM | POA: Insufficient documentation

## 2024-03-02 DIAGNOSIS — R109 Unspecified abdominal pain: Secondary | ICD-10-CM

## 2024-03-02 LAB — CBC
HCT: 37.7 % (ref 36.0–46.0)
Hemoglobin: 12 g/dL (ref 12.0–15.0)
MCH: 23.9 pg — ABNORMAL LOW (ref 26.0–34.0)
MCHC: 31.8 g/dL (ref 30.0–36.0)
MCV: 75 fL — ABNORMAL LOW (ref 80.0–100.0)
Platelets: 269 10*3/uL (ref 150–400)
RBC: 5.03 MIL/uL (ref 3.87–5.11)
RDW: 13.3 % (ref 11.5–15.5)
WBC: 5.3 10*3/uL (ref 4.0–10.5)
nRBC: 0 % (ref 0.0–0.2)

## 2024-03-02 LAB — COMPREHENSIVE METABOLIC PANEL WITH GFR
ALT: 57 U/L — ABNORMAL HIGH (ref 0–44)
AST: 38 U/L (ref 15–41)
Albumin: 3.4 g/dL — ABNORMAL LOW (ref 3.5–5.0)
Alkaline Phosphatase: 113 U/L (ref 38–126)
Anion gap: 9 (ref 5–15)
BUN: 24 mg/dL — ABNORMAL HIGH (ref 6–20)
CO2: 21 mmol/L — ABNORMAL LOW (ref 22–32)
Calcium: 10.4 mg/dL — ABNORMAL HIGH (ref 8.9–10.3)
Chloride: 109 mmol/L (ref 98–111)
Creatinine, Ser: 0.78 mg/dL (ref 0.44–1.00)
GFR, Estimated: >60 mL/min
Glucose, Bld: 91 mg/dL (ref 70–99)
Potassium: 4.8 mmol/L (ref 3.5–5.1)
Sodium: 139 mmol/L (ref 135–145)
Total Bilirubin: 0.6 mg/dL (ref 0.0–1.2)
Total Protein: 6.6 g/dL (ref 6.5–8.1)

## 2024-03-02 LAB — HCG, SERUM, QUALITATIVE: Preg, Serum: NEGATIVE

## 2024-03-02 LAB — LIPASE, BLOOD: Lipase: 34 U/L (ref 11–51)

## 2024-03-02 MED ORDER — SUCRALFATE 1 G PO TABS
1.0000 g | ORAL_TABLET | Freq: Once | ORAL | Status: AC
  Administered 2024-03-02: 1 g via ORAL
  Filled 2024-03-02: qty 1

## 2024-03-02 MED ORDER — DICYCLOMINE HCL 10 MG/5ML PO SOLN
10.0000 mg | Freq: Once | ORAL | Status: AC
  Administered 2024-03-02: 10 mg via ORAL
  Filled 2024-03-02: qty 5

## 2024-03-02 MED ORDER — ALUM & MAG HYDROXIDE-SIMETH 200-200-20 MG/5ML PO SUSP
30.0000 mL | Freq: Once | ORAL | Status: AC
  Administered 2024-03-02: 30 mL via ORAL
  Filled 2024-03-02: qty 30

## 2024-03-02 MED ORDER — SUCRALFATE 1 G PO TABS: 1.0000 g | ORAL_TABLET | Freq: Four times a day (QID) | ORAL | 0 refills | Status: DC

## 2024-03-02 MED ORDER — PANTOPRAZOLE SODIUM 40 MG PO TBEC: 40.0000 mg | DELAYED_RELEASE_TABLET | Freq: Every day | ORAL | 0 refills | Status: DC

## 2024-03-02 NOTE — ED Notes (Signed)
 Delay in d/c d/t influx of other critical pts in department

## 2024-03-02 NOTE — Discharge Instructions (Signed)
 Your symptoms are likely caused by the colchicine and ibuprofen that you are taking for your pericarditis.  Return here if you develop vomiting blood, severe pain, or any other problems

## 2024-03-02 NOTE — ED Provider Notes (Signed)
 [place] EM[facility]ENC[facility] DEPARTMENT AT [facility] Provider Note   CSN: [id number] Arrival date & time: 03/02/24  1043     History  Chief Complaint  Patient presents with   Abdominal Pain    Angela Manning is a 38 y.o. female.  38 year old female presents with abdominal discomfort since leaving hospital 2 days ago.  She was in the hospital for thyroid toxicosis.  Was also diagnosed with pericarditis and was prescribed colchicine and ibuprofen.  States she has had epigastric pain characterized as burning which occurs after she eats.  She has had nausea but no vomiting.  No black or bloody stools.  No fever or chills.  Has not taken any antacids.  She denies any chest pain or shortness of breath.  Also notes irregular menstrual period which she states is chronic for her and denies any lower pelvic pain.  No urinary symptoms      Home Medications Prior to Admission medications   Medication Sig Start Date End Date Taking? Authorizing Provider  amLODipine (NORVASC) 10 MG tablet Take 1 tablet (10 mg total) by mouth daily. 02/05/24   Rocky Morel, DO  atenolol (TENORMIN) 25 MG tablet Take 1 tablet (25 mg total) by mouth daily. 03/01/24   Marrianne Mood, MD  colchicine 0.6 MG tablet Take 1 tablet (0.6 mg total) by mouth 2 (two) times daily. 02/29/24   Marrianne Mood, MD  ibuprofen (ADVIL) 600 MG tablet Take 1 tablet (600 mg total) by mouth 3 (three) times daily. 02/29/24   Marrianne Mood, MD  lisinopril (ZESTRIL) 10 MG tablet Take 1 tablet (10 mg total) by mouth daily. 02/05/24 05/05/24  Rocky Morel, DO  methimazole (TAPAZOLE) 10 MG tablet Take 2 tablets (20 mg total) by mouth 2 (two) times daily. 02/29/24   Marrianne Mood, MD  spironolactone (ALDACTONE) 25 MG tablet Take 1 tablet (25 mg total) by mouth daily. 02/05/24   Rocky Morel, DO  norethindrone (MICRONOR,CAMILA,ERRIN) 0.35 MG tablet Take 1 tablet (0.35 mg total) by mouth daily. Patient not taking: Reported on  08/23/2019 10/07/18 09/29/19  Conan Bowens, MD      Allergies    Flagyl [metronidazole]    Review of Systems   Review of Systems  All other systems reviewed and are negative.   Physical Exam Updated Vital Signs BP (!) 161/113   Pulse 94   Temp 98.3 F (36.8 C)   Resp 19   Ht 1.702 m (5\' 7" )   Wt 65.9 kg   LMP 11/14/2023 (Exact Date)   SpO2 100%   BMI 22.75 kg/m  Physical Exam Vitals and nursing note reviewed.  Constitutional:      General: She is not in acute distress.    Appearance: Normal appearance. She is well-developed. She is not toxic-appearing.  HENT:     Head: Normocephalic and atraumatic.  Eyes:     General: Lids are normal.     Conjunctiva/sclera: Conjunctivae normal.     Pupils: Pupils are equal, round, and reactive to light.  Neck:     Thyroid: No thyroid mass.     Trachea: No tracheal deviation.  Cardiovascular:     Rate and Rhythm: Normal rate and regular rhythm.     Heart sounds: Normal heart sounds. No murmur heard.    No gallop.  Pulmonary:     Effort: Pulmonary effort is normal. No respiratory distress.     Breath sounds: Normal breath sounds. No stridor. No decreased breath sounds, wheezing, rhonchi or rales.  Abdominal:     General: There is no distension.     Palpations: Abdomen is soft.     Tenderness: There is no abdominal tenderness. There is no rebound.    Musculoskeletal:        General: No tenderness. Normal range of motion.     Cervical back: Normal range of motion and neck supple.  Skin:    General: Skin is warm and dry.     Findings: No abrasion or rash.  Neurological:     Mental Status: She is alert and oriented to person, place, and time. Mental status is at baseline.     GCS: GCS eye subscore is 4. GCS verbal subscore is 5. GCS motor subscore is 6.     Cranial Nerves: No cranial nerve deficit.     Sensory: No sensory deficit.     Motor: Motor function is intact.  Psychiatric:        Attention and Perception: Attention  normal.        Speech: Speech normal.        Behavior: Behavior normal.    ED Results / Procedures / Treatments   Labs (all labs ordered are listed, but only abnormal results are displayed) Labs Reviewed  CBC - Abnormal; Notable for the following components:      Result Value   MCV 75.0 (*)    MCH 23.9 (*)    All other components within normal limits  LIPASE, BLOOD  COMPREHENSIVE METABOLIC PANEL  URINALYSIS, ROUTINE W REFLEX MICROSCOPIC  HCG, SERUM, QUALITATIVE    EKG None  Radiology No results found.  Procedures Procedures    Medications Ordered in ED Medications  sucralfate (CARAFATE) tablet 1 g (has no administration in time range)  alum & mag hydroxide-simeth (MAALOX/MYLANTA) 200-200-20 MG/5ML suspension 30 mL (has no administration in time range)  dicyclomine (BENTYL) 10 MG/5ML solution 10 mg (has no administration in time range)    ED Course/ Medical Decision Making/ A&P                                 Medical Decision Making Amount and/or Complexity of Data Reviewed Labs: ordered.  Risk OTC drugs. Prescription drug management.   Patient with likely GI upset from her colchicine and ibuprofen she is taking for recent diagnosis of pericarditis.  Labs here performed and overall reassuring.  CBC is normal.  Low suspicion for infection.  Pregnancy test is negative.  She has had some dysfunctional bleeding which she states is on change to her prior.  Patient treated with Carafate as well as GI cocktail feels better at this time.  Will prescribe Carafate and discharge        Final Clinical Impression(s) / ED Diagnoses Final diagnoses:  None    Rx / DC Orders ED Discharge Orders     None         Lorre Nick, MD 03/02/24 1329

## 2024-03-02 NOTE — ED Notes (Signed)
 Pt ambulatory back to exam room from w/r, steady gait, alert, NAD, calm, interactive. EDP into room, at [facility].

## 2024-03-02 NOTE — ED Triage Notes (Addendum)
 Pt came to ED after a recent hospital admission for thyroid issue. Since DC pt has been having severe upper abd pain. Pain is stabbing and constant. Gets worse after eating. Endorses nausea but no vomiting of diarrhea. LBM was this morning and normal. Pt states that she had been on her period for 2 weeks which is not normal for her. Denies urinary symptoms

## 2024-03-03 ENCOUNTER — Ambulatory Visit: Payer: Self-pay | Admitting: Student

## 2024-03-03 ENCOUNTER — Encounter (HOSPITAL_BASED_OUTPATIENT_CLINIC_OR_DEPARTMENT_OTHER): Payer: Medicaid Other | Admitting: Cardiovascular Disease

## 2024-03-03 VITALS — BP 136/94 | HR 88 | Temp 98.2°F | Ht 67.0 in | Wt 141.0 lb

## 2024-03-03 DIAGNOSIS — E0500 Thyrotoxicosis with diffuse goiter without thyrotoxic crisis or storm: Secondary | ICD-10-CM | POA: Diagnosis not present

## 2024-03-03 DIAGNOSIS — I300 Acute nonspecific idiopathic pericarditis: Secondary | ICD-10-CM | POA: Diagnosis not present

## 2024-03-03 DIAGNOSIS — I10 Essential (primary) hypertension: Secondary | ICD-10-CM

## 2024-03-03 NOTE — Progress Notes (Signed)
 CC: Hospital follow up  HPI:  Angela Manning is a 38 y.o. female living with a history stated below and presents today for hospital follow up. Please see problem based assessment and plan for additional details.  Past Medical History:  Diagnosis Date   Bacterial vaginosis    Hypertension    Hypertension affecting pregnancy, antepartum 10/27/2019   Ovarian cyst    Preeclampsia 09/10/2023   Pregnancy induced hypertension    Primary hypertension 03/03/2020    Current Outpatient Medications on File Prior to Visit  Medication Sig Dispense Refill   amLODipine (NORVASC) 10 MG tablet Take 1 tablet (10 mg total) by mouth daily. 90 tablet 2   atenolol (TENORMIN) 25 MG tablet Take 1 tablet (25 mg total) by mouth daily. 30 tablet 2   colchicine 0.6 MG tablet Take 1 tablet (0.6 mg total) by mouth 2 (two) times daily. 180 tablet 0   ibuprofen (ADVIL) 600 MG tablet Take 1 tablet (600 mg total) by mouth 3 (three) times daily. 30 tablet 0   lisinopril (ZESTRIL) 10 MG tablet Take 1 tablet (10 mg total) by mouth daily. 90 tablet 3   methimazole (TAPAZOLE) 10 MG tablet Take 2 tablets (20 mg total) by mouth 2 (two) times daily. 120 tablet 2   pantoprazole (PROTONIX) 40 MG tablet Take 1 tablet (40 mg total) by mouth daily. 30 tablet 0   spironolactone (ALDACTONE) 25 MG tablet Take 1 tablet (25 mg total) by mouth daily. 90 tablet 3   sucralfate (CARAFATE) 1 g tablet Take 1 tablet (1 g total) by mouth 4 (four) times daily. 30 tablet 0   [DISCONTINUED] norethindrone (MICRONOR,CAMILA,ERRIN) 0.35 MG tablet Take 1 tablet (0.35 mg total) by mouth daily. (Patient not taking: Reported on 08/23/2019) 1 Package 11   No current facility-administered medications on file prior to visit.    Family History  Problem Relation Age of Onset   Stroke Mother    Hypertension Mother    Valvular heart disease Mother    Healthy Father    Hypertension Maternal Grandmother    Hypertension Paternal Grandmother      Social History   Socioeconomic History   Marital status: Single    Spouse name: Not on file   Number of children: Not on file   Years of education: Not on file   Highest education level: Not on file  Occupational History   Not on file  Tobacco Use   Smoking status: Some Days    Current packs/day: 0.10    Types: Cigarettes   Smokeless tobacco: Former    Quit date: 01/08/2015   Tobacco comments:    Smokes sometimes.  Vaping Use   Vaping status: Never Used  Substance and Sexual Activity   Alcohol use: Not Currently   Drug use: No   Sexual activity: Yes    Partners: Male    Birth control/protection: None  Other Topics Concern   Not on file  Social History Narrative   Not on file   Social Drivers of Health   Financial Resource Strain: Medium Risk (05/07/2023)   Overall Financial Resource Strain (CARDIA)    Difficulty of Paying Living Expenses: Somewhat hard  Food Insecurity: No Food Insecurity (02/27/2024)   Hunger Vital Sign    Worried About Running Out of Food in the Last Year: Never true    Ran Out of Food in the Last Year: Never true  Transportation Needs: Patient Declined (02/28/2024)   PRAPARE - Transportation  Lack of Transportation (Medical): Patient declined    Lack of Transportation (Non-Medical): Patient declined  Physical Activity: Inactive (09/10/2023)   Exercise Vital Sign    Days of Exercise per Week: 0 days    Minutes of Exercise per Session: 0 min  Stress: Not on file  Social Connections: Moderately Integrated (05/07/2023)   Social Connection and Isolation Panel [NHANES]    Frequency of Communication with Friends and Family: More than three times a week    Frequency of Social Gatherings with Friends and Family: More than three times a week    Attends Religious Services: More than 4 times per year    Active Member of [employer] or Organizations: No    Attends Banker Meetings: 1 to 4 times per year    Marital Status: Never married   Intimate Partner Violence: Unknown (02/28/2024)   Humiliation, Afraid, Rape, and Kick questionnaire    Fear of Current or Ex-Partner: Patient declined    Emotionally Abused: Patient declined    Physically Abused: No    Sexually Abused: No    Review of Systems: ROS negative except for what is noted on the assessment and plan.  Vitals:   03/03/24 1455 03/03/24 1513  BP: (!) 154/84 (!) 136/94  Pulse: 88 88  Temp: 98.2 F (36.8 C)   TempSrc: Oral   SpO2: 100%   Weight: 141 lb (64 kg)   Height: 5\' 7"  (1.702 m)     Physical Exam: Constitutional: well-appearing female  in no acute distress HENT: normocephalic atraumatic, mucous membranes moist Eyes: conjunctiva non-erythematous Neck: supple, tender thyroid, mildly swollen Cardiovascular: regular rate and rhythm, no m/r/g Pulmonary/Chest: normal work of breathing on room air, lungs clear to auscultation bilaterally Abdominal: soft, non-tender, non-distended  Follow up Hospitalization  Patient was admitted to [facility] on 02/28/24 and discharged on 02/29/24. She was treated for thyroid toxicosis. Treatment for this included methimazole and atenolol. Telephone follow up was done on 03/01/24 She reports excellent compliance with treatment. She reports this condition is improved.  ----------------------------------------------------------------------------------------- -    Assessment & Plan:   Acute pericarditis Seems to have resolved with the colchicine.   Thyrotoxicosis due to Graves' disease Pt was recently admitted to the hospital for thyrotoxicosis, found to have new Grave's Disease. She was started on methimazole 20mg  BID and atenolol 25mg . She states that most of her symptoms have resolved, although she still is having some nausea and vomiting. Her diet hasn't improved either, as a small amount of food makes her feel nauseous. She has follow up with endocrinology on 03/11/24, so will hold on repeat thyroid function  testings.   Plan:  - Continue methimazole 20mg  BID  - Continue Atenolol 25mg   - Pt to see endo on 03/11/24  Primary hypertension On atenolol, and lisinopril. Blood pressure elevated in clinic to 136/94. She does have room to go up on her lisinopril (currently at 10mg ), however will hold off for now until endo evaluation for thyroid storm.    Patient discussed with Dr. Lennon Alstrom Omesha Bowerman, M.D. [facility] Internal Medicine, PGY-2 Pager: [phone] Date 03/03/2024 Time 5:36 PM

## 2024-03-03 NOTE — Assessment & Plan Note (Signed)
 Seems to have resolved with the colchicine.

## 2024-03-03 NOTE — Patient Instructions (Signed)
 Thank you so much for coming to the clinic today!   I'm glad you're doing so much better! As of now we won't make any changes.   If you have any questions please feel free to the call the clinic at anytime at [phone]. It was a pleasure seeing you!  Best, Dr. Thomasene Ripple

## 2024-03-03 NOTE — Assessment & Plan Note (Signed)
 Pt was recently admitted to the hospital for thyrotoxicosis, found to have new Grave's Disease. She was started on methimazole 20mg  BID and atenolol 25mg . She states that most of her symptoms have resolved, although she still is having some nausea and vomiting. Her diet hasn't improved either, as a small amount of food makes her feel nauseous. She has follow up with endocrinology on 03/11/24, so will hold on repeat thyroid function testings.   Plan:  - Continue methimazole 20mg  BID  - Continue Atenolol 25mg   - Pt to see endo on 03/11/24

## 2024-03-03 NOTE — Assessment & Plan Note (Signed)
 On atenolol, and lisinopril. Blood pressure elevated in clinic to 136/94. She does have room to go up on her lisinopril (currently at 10mg ), however will hold off for now until endo evaluation for thyroid storm.

## 2024-03-05 ENCOUNTER — Encounter: Admitting: Student

## 2024-03-08 ENCOUNTER — Ambulatory Visit (HOSPITAL_COMMUNITY)

## 2024-03-08 NOTE — Progress Notes (Signed)
 Internal Medicine Clinic Attending  Case discussed with the resident at the time of the visit.  We reviewed the resident's history and exam and pertinent patient test results.  I agree with the assessment, diagnosis, and plan of care documented in the resident's note.

## 2024-03-10 DIAGNOSIS — Z113 Encounter for screening for infections with a predominantly sexual mode of transmission: Secondary | ICD-10-CM | POA: Diagnosis not present

## 2024-03-10 DIAGNOSIS — N760 Acute vaginitis: Secondary | ICD-10-CM | POA: Diagnosis not present

## 2024-03-10 DIAGNOSIS — Z114 Encounter for screening for human immunodeficiency virus [HIV]: Secondary | ICD-10-CM | POA: Diagnosis not present

## 2024-03-11 DIAGNOSIS — E0590 Thyrotoxicosis, unspecified without thyrotoxic crisis or storm: Secondary | ICD-10-CM | POA: Diagnosis not present

## 2024-03-30 ENCOUNTER — Emergency Department (HOSPITAL_COMMUNITY)

## 2024-03-30 ENCOUNTER — Other Ambulatory Visit: Payer: Self-pay

## 2024-03-30 ENCOUNTER — Observation Stay (HOSPITAL_COMMUNITY)
Admission: EM | Admit: 2024-03-30 | Discharge: 2024-03-31 | Disposition: A | Discharge status: Home / Self Care | Attending: Internal Medicine | Admitting: Internal Medicine

## 2024-03-30 ENCOUNTER — Encounter (HOSPITAL_COMMUNITY): Payer: Self-pay

## 2024-03-30 DIAGNOSIS — Z79899 Other long term (current) drug therapy: Secondary | ICD-10-CM | POA: Diagnosis not present

## 2024-03-30 DIAGNOSIS — I10 Essential (primary) hypertension: Secondary | ICD-10-CM | POA: Insufficient documentation

## 2024-03-30 DIAGNOSIS — R404 Transient alteration of awareness: Secondary | ICD-10-CM

## 2024-03-30 DIAGNOSIS — R4020 Unspecified coma: Secondary | ICD-10-CM | POA: Diagnosis not present

## 2024-03-30 DIAGNOSIS — E0500 Thyrotoxicosis with diffuse goiter without thyrotoxic crisis or storm: Secondary | ICD-10-CM | POA: Insufficient documentation

## 2024-03-30 DIAGNOSIS — R55 Syncope and collapse: Secondary | ICD-10-CM | POA: Diagnosis not present

## 2024-03-30 DIAGNOSIS — R410 Disorientation, unspecified: Secondary | ICD-10-CM | POA: Diagnosis not present

## 2024-03-30 DIAGNOSIS — E875 Hyperkalemia: Secondary | ICD-10-CM | POA: Insufficient documentation

## 2024-03-30 DIAGNOSIS — R220 Localized swelling, mass and lump, head: Secondary | ICD-10-CM | POA: Insufficient documentation

## 2024-03-30 DIAGNOSIS — N179 Acute kidney failure, unspecified: Secondary | ICD-10-CM | POA: Insufficient documentation

## 2024-03-30 DIAGNOSIS — L03213 Periorbital cellulitis: Secondary | ICD-10-CM | POA: Diagnosis not present

## 2024-03-30 DIAGNOSIS — E162 Hypoglycemia, unspecified: Secondary | ICD-10-CM | POA: Diagnosis not present

## 2024-03-30 DIAGNOSIS — R4182 Altered mental status, unspecified: Principal | ICD-10-CM | POA: Insufficient documentation

## 2024-03-30 DIAGNOSIS — D72829 Elevated white blood cell count, unspecified: Secondary | ICD-10-CM | POA: Insufficient documentation

## 2024-03-30 DIAGNOSIS — T50904A Poisoning by unspecified drugs, medicaments and biological substances, undetermined, initial encounter: Secondary | ICD-10-CM | POA: Diagnosis not present

## 2024-03-30 LAB — CBC WITH DIFFERENTIAL/PLATELET
Abs Immature Granulocytes: 0.2 10*3/uL — ABNORMAL HIGH (ref 0.00–0.07)
Basophils Absolute: 0 10*3/uL (ref 0.0–0.1)
Basophils Relative: 0 %
Eosinophils Absolute: 0 10*3/uL (ref 0.0–0.5)
Eosinophils Relative: 0 %
HCT: 35 % — ABNORMAL LOW (ref 36.0–46.0)
Hemoglobin: 10.7 g/dL — ABNORMAL LOW (ref 12.0–15.0)
Immature Granulocytes: 1 %
Lymphocytes Relative: 3 %
Lymphs Abs: 0.6 10*3/uL — ABNORMAL LOW (ref 0.7–4.0)
MCH: 23.7 pg — ABNORMAL LOW (ref 26.0–34.0)
MCHC: 30.6 g/dL (ref 30.0–36.0)
MCV: 77.6 fL — ABNORMAL LOW (ref 80.0–100.0)
Monocytes Absolute: 1 10*3/uL (ref 0.1–1.0)
Monocytes Relative: 4 %
Neutro Abs: 21.3 10*3/uL — ABNORMAL HIGH (ref 1.7–7.7)
Neutrophils Relative %: 92 %
Platelets: 268 10*3/uL (ref 150–400)
RBC: 4.51 MIL/uL (ref 3.87–5.11)
RDW: 16 % — ABNORMAL HIGH (ref 11.5–15.5)
WBC: 23.1 10*3/uL — ABNORMAL HIGH (ref 4.0–10.5)
nRBC: 0 % (ref 0.0–0.2)

## 2024-03-30 LAB — COMPREHENSIVE METABOLIC PANEL WITH GFR
ALT: 39 U/L (ref 0–44)
AST: 47 U/L — ABNORMAL HIGH (ref 15–41)
Albumin: 3.6 g/dL (ref 3.5–5.0)
Alkaline Phosphatase: 107 U/L (ref 38–126)
Anion gap: 15 (ref 5–15)
BUN: 21 mg/dL — ABNORMAL HIGH (ref 6–20)
CO2: 17 mmol/L — ABNORMAL LOW (ref 22–32)
Calcium: 8.8 mg/dL — ABNORMAL LOW (ref 8.9–10.3)
Chloride: 107 mmol/L (ref 98–111)
Creatinine, Ser: 1.38 mg/dL — ABNORMAL HIGH (ref 0.44–1.00)
GFR, Estimated: 51 mL/min — ABNORMAL LOW (ref >60)
Glucose, Bld: 155 mg/dL — ABNORMAL HIGH (ref 70–99)
Potassium: 5.6 mmol/L — ABNORMAL HIGH (ref 3.5–5.1)
Sodium: 139 mmol/L (ref 135–145)
Total Bilirubin: <0.2 mg/dL (ref 0.0–1.2)
Total Protein: 6.7 g/dL (ref 6.5–8.1)

## 2024-03-30 LAB — CBG MONITORING, ED: Glucose-Capillary: 131 mg/dL — ABNORMAL HIGH (ref 70–99)

## 2024-03-30 LAB — TSH: TSH: <0.01 u[IU]/mL — ABNORMAL LOW (ref 0.350–4.500)

## 2024-03-30 LAB — ETHANOL: Alcohol, Ethyl (B): <15 mg/dL (ref <15)

## 2024-03-30 LAB — T4, FREE: Free T4: 1.68 ng/dL — ABNORMAL HIGH (ref 0.61–1.12)

## 2024-03-30 LAB — HCG, SERUM, QUALITATIVE: Preg, Serum: NEGATIVE

## 2024-03-30 MED ORDER — COLCHICINE 0.6 MG PO TABS
0.6000 mg | ORAL_TABLET | Freq: Two times a day (BID) | ORAL | Status: DC
Start: 2024-03-30 — End: 2024-03-31
  Administered 2024-03-30 – 2024-03-31 (×2): 0.6 mg via ORAL
  Filled 2024-03-30 (×3): qty 1

## 2024-03-30 MED ORDER — ENOXAPARIN SODIUM 40 MG/0.4ML IJ SOSY
40.0000 mg | PREFILLED_SYRINGE | INTRAMUSCULAR | Status: DC
  Administered 2024-03-30: 40 mg via SUBCUTANEOUS
  Filled 2024-03-30: qty 0.4

## 2024-03-30 MED ORDER — SODIUM CHLORIDE 0.9 % IV SOLN: INTRAVENOUS | Status: DC

## 2024-03-30 MED ORDER — ACETAMINOPHEN 325 MG PO TABS: 650.0000 mg | ORAL_TABLET | Freq: Four times a day (QID) | ORAL | Status: DC | PRN

## 2024-03-30 MED ORDER — LISINOPRIL 20 MG PO TABS
10.0000 mg | ORAL_TABLET | Freq: Every day | ORAL | Status: DC
Start: 2024-03-31 — End: 2024-03-31
  Administered 2024-03-31: 10 mg via ORAL
  Filled 2024-03-30: qty 1

## 2024-03-30 MED ORDER — SODIUM CHLORIDE 0.9 % IV BOLUS
1000.0000 mL | Freq: Once | INTRAVENOUS | Status: AC
  Administered 2024-03-30: 1000 mL via INTRAVENOUS

## 2024-03-30 MED ORDER — SODIUM ZIRCONIUM CYCLOSILICATE 5 G PO PACK
5.0000 g | PACK | Freq: Every day | ORAL | Status: DC
  Administered 2024-03-31: 5 g via ORAL
  Filled 2024-03-30 (×2): qty 1

## 2024-03-30 MED ORDER — ACETAMINOPHEN 650 MG RE SUPP: 650.0000 mg | Freq: Four times a day (QID) | RECTAL | Status: DC | PRN

## 2024-03-30 MED ORDER — SODIUM ZIRCONIUM CYCLOSILICATE 5 G PO PACK
5.0000 g | PACK | Freq: Once | ORAL | Status: AC
  Administered 2024-03-30: 5 g via ORAL
  Filled 2024-03-30: qty 1

## 2024-03-30 MED ORDER — AMLODIPINE BESYLATE 10 MG PO TABS
10.0000 mg | ORAL_TABLET | Freq: Every day | ORAL | Status: DC
  Administered 2024-03-31: 10 mg via ORAL
  Filled 2024-03-30: qty 1

## 2024-03-30 MED ORDER — ONDANSETRON HCL 4 MG/2ML IJ SOLN
4.0000 mg | Freq: Once | INTRAMUSCULAR | Status: AC
  Administered 2024-03-30: 4 mg via INTRAVENOUS
  Filled 2024-03-30: qty 2

## 2024-03-30 MED ORDER — METHIMAZOLE 10 MG PO TABS
20.0000 mg | ORAL_TABLET | Freq: Two times a day (BID) | ORAL | Status: DC
Start: 2024-03-30 — End: 2024-03-31
  Administered 2024-03-30 – 2024-03-31 (×2): 20 mg via ORAL
  Filled 2024-03-30 (×3): qty 2

## 2024-03-30 MED ORDER — SPIRONOLACTONE 25 MG PO TABS
25.0000 mg | ORAL_TABLET | Freq: Every day | ORAL | Status: DC
  Administered 2024-03-31: 25 mg via ORAL
  Filled 2024-03-30: qty 1

## 2024-03-30 MED ORDER — ATENOLOL 25 MG PO TABS
25.0000 mg | ORAL_TABLET | Freq: Every day | ORAL | Status: DC
  Administered 2024-03-30 – 2024-03-31 (×2): 25 mg via ORAL
  Filled 2024-03-30 (×2): qty 1

## 2024-03-30 NOTE — Plan of Care (Signed)

## 2024-03-30 NOTE — ED Provider Notes (Addendum)
 [place] EMERGENCY DEPARTMENT AT [facility] Provider Note   CSN: [id number] Arrival date & time: 03/30/24  1216     History  Chief Complaint  Patient presents with   Drug Overdose   Hypoglycemia    Angela Manning is a 38 y.o. female.  HPI Patient presents via EMS with concern for altered mental status.  Patient herself is unsure of what occurred, states that she was at work, went home, and is slowly realizing what is happening.  She seemingly denies pain, denies dyspnea. Per EMS the patient was at home, unresponsive, family members may have done CPR for a few moments, and with apneic breathing patient received bag-valve-mask assistance.  Patient found to be hypoglycemic, 55, and received both dextrose  and Narcan subsequently patient awoke in transport.  Patient denies medical problems, as above is unsure why she was here.    Home Medications Prior to Admission medications   Medication Sig Start Date End Date Taking? Authorizing Provider  amLODipine  (NORVASC ) 10 MG tablet Take 1 tablet (10 mg total) by mouth daily. 02/05/24  Yes Cleven Dallas, DO  atenolol  (TENORMIN ) 25 MG tablet Take 1 tablet (25 mg total) by mouth daily. 03/01/24  Yes Adria Hopkins, MD  colchicine  0.6 MG tablet Take 1 tablet (0.6 mg total) by mouth 2 (two) times daily. 02/29/24  Yes Adria Hopkins, MD  ibuprofen  (ADVIL ) 600 MG tablet Take 1 tablet (600 mg total) by mouth 3 (three) times daily. 02/29/24  Yes Adria Hopkins, MD  lisinopril  (ZESTRIL ) 10 MG tablet Take 1 tablet (10 mg total) by mouth daily. 02/05/24 05/05/24 Yes Cleven Dallas, DO  methimazole  (TAPAZOLE ) 10 MG tablet Take 2 tablets (20 mg total) by mouth 2 (two) times daily. 02/29/24  Yes Adria Hopkins, MD  spironolactone  (ALDACTONE ) 25 MG tablet Take 1 tablet (25 mg total) by mouth daily. 02/05/24  Yes Cleven Dallas, DO  pantoprazole  (PROTONIX ) 40 MG tablet Take 1 tablet (40 mg total) by mouth daily. Patient not taking:  Reported on 03/30/2024 03/02/24   Lind Repine, MD  sucralfate  (CARAFATE ) 1 g tablet Take 1 tablet (1 g total) by mouth 4 (four) times daily. Patient not taking: Reported on 03/30/2024 03/02/24   Lind Repine, MD  norethindrone  (MICRONOR ,CAMILA ,ERRIN ) 0.35 MG tablet Take 1 tablet (0.35 mg total) by mouth daily. Patient not taking: Reported on 08/23/2019 10/07/18 09/29/19  Jan Mcgill, MD      Allergies    Flagyl  [metronidazole ]    Review of Systems   Review of Systems  Physical Exam Updated Vital Signs BP 111/77   Pulse 97   Temp (!) 97.4 F (36.3 C) (Oral)   Resp 15   Ht 5\' 7"  (1.702 m)   Wt 63.5 kg   LMP  (LMP Unknown)   SpO2 96%   BMI 21.93 kg/m  Physical Exam Vitals and nursing note reviewed.  Constitutional:      General: She is not in acute distress.    Appearance: She is well-developed.  HENT:     Head: Normocephalic and atraumatic.  Eyes:     Conjunctiva/sclera: Conjunctivae normal.  Cardiovascular:     Rate and Rhythm: Regular rhythm. Tachycardia present.  Pulmonary:     Effort: Pulmonary effort is normal. No respiratory distress.     Breath sounds: Normal breath sounds. No stridor.  Abdominal:     General: There is no distension.  Skin:    General: Skin is warm and dry.  Neurological:     Mental  Status: She is alert.     Cranial Nerves: No cranial nerve deficit.  Psychiatric:     Comments: Confused     ED Results / Procedures / Treatments   Labs (all labs ordered are listed, but only abnormal results are displayed) Labs Reviewed  COMPREHENSIVE METABOLIC PANEL WITH GFR - Abnormal; Notable for the following components:      Result Value   Potassium 5.6 (*)    CO2 17 (*)    Glucose, Bld 155 (*)    BUN 21 (*)    Creatinine, Ser 1.38 (*)    Calcium 8.8 (*)    AST 47 (*)    GFR, Estimated 51 (*)    All other components within normal limits  CBC WITH DIFFERENTIAL/PLATELET - Abnormal; Notable for the following components:   WBC 23.1 (*)     Hemoglobin 10.7 (*)    HCT 35.0 (*)    MCV 77.6 (*)    MCH 23.7 (*)    RDW 16.0 (*)    Neutro Abs 21.3 (*)    Lymphs Abs 0.6 (*)    Abs Immature Granulocytes 0.20 (*)    All other components within normal limits  T4, FREE - Abnormal; Notable for the following components:   Free T4 1.68 (*)    All other components within normal limits  TSH - Abnormal; Notable for the following components:   TSH <0.010 (*)    All other components within normal limits  CBG MONITORING, ED - Abnormal; Notable for the following components:   Glucose-Capillary 131 (*)    All other components within normal limits  HCG, SERUM, QUALITATIVE  ETHANOL  URINALYSIS, ROUTINE W REFLEX MICROSCOPIC  RAPID URINE DRUG SCREEN, HOSP PERFORMED    EKG EKG Interpretation Date/Time:  Tuesday March 30 2024 12:17:31 EDT Ventricular Rate:  122 PR Interval:  153 QRS Duration:  94 QT Interval:  314 QTC Calculation: 448 R Axis:   74  Text Interpretation: Sinus tachycardia LAE, consider biatrial enlargement Left ventricular hypertrophy similar to prior Confirmed by Dorenda Gandy [phone]) on 03/30/2024 1:16:56 PM  Radiology CT HEAD WO CONTRAST Result Date: 03/30/2024 CLINICAL DATA:  Mental status change, unknown cause.  Unresponsive EXAM: CT HEAD WITHOUT CONTRAST TECHNIQUE: Contiguous axial images were obtained from the base of the skull through the vertex without intravenous contrast. RADIATION DOSE REDUCTION: This exam was performed according to the departmental dose-optimization program which includes automated exposure control, adjustment of the mA and/or kV according to patient size and/or use of iterative reconstruction technique. COMPARISON:  05/07/2023 FINDINGS: Brain: No acute intracranial abnormality. Specifically, no hemorrhage, hydrocephalus, mass lesion, acute infarction, or significant intracranial injury. Vascular: No hyperdense vessel or unexpected calcification. Skull: No acute calvarial abnormality.  Sinuses/Orbits: No acute findings Other: None IMPRESSION: No acute intracranial abnormality. Electronically Signed   By: Janeece Mechanic M.D.   On: 03/30/2024 14:38   DG Chest Port 1 View Result Date: 03/30/2024 CLINICAL DATA:  ALOC EXAM: PORTABLE CHEST 1 VIEW COMPARISON:  None Available. FINDINGS: The heart size and mediastinal contours are within normal limits. Both lungs are clear. The visualized skeletal structures are unremarkable. IMPRESSION: No active disease. Electronically Signed   By: Fredrich Jefferson M.D.   On: 03/30/2024 14:09    Procedures Procedures    Medications Ordered in ED Medications  sodium chloride  0.9 % bolus 1,000 mL (0 mLs Intravenous Stopped 03/30/24 1359)    And  0.9 %  sodium chloride  infusion ( Intravenous New Bag/Given 03/30/24 1359)  sodium chloride  0.9 % bolus 1,000 mL (has no administration in time range)  sodium zirconium cyclosilicate  (LOKELMA ) packet 5 g (has no administration in time range)    ED Course/ Medical Decision Making/ A&P                                 Medical Decision Making Adult female presents with acute onset cognitive changes, initially reportedly hypoglycemic and with apnea, concerning for infection versus hypoglycemic condition versus overdose. Cardiac 128 sinus normal line pulse ox 99% room air normal With consideration as above patient placed on continuous monitoring head CT, labs all commenced.  Amount and/or Complexity of Data Reviewed Independent Historian: EMS Labs: ordered. Decision-making details documented in ED Course. Radiology: ordered and independent interpretation performed. Decision-making details documented in ED Course. ECG/medicine tests: ordered and independent interpretation performed. Decision-making details documented in ED Course.  Risk Prescription drug management. Decision regarding hospitalization.   3:30 PM On repeat exam patient much more awake, though without full recall of what occurred earlier in  the day.  She has been ambulatory with assistance, minimally. She is not covered by her father, who corroborates that the patient is not in her usual state of affairs.  Patient's initial labs noted for leukocytosis, thyroid  panel pending, CHEM panel notable for acute kidney injury, mild hyperkalemia, head CT reviewed, no acute intracranial abnormality.  Suspicion for syncope versus endocrinologic etiology versus intoxication/overdose.  Given unclear circumstances for her episode of altered mental status, patient will be admitted for further monitoring, management.  CRITICAL CARE Performed by: Dorenda Gandy Total critical care time: 35 minutes Critical care time was exclusive of separately billable procedures and treating other patients. Critical care was necessary to treat or prevent imminent or life-threatening deterioration. Critical care was time spent personally by me on the following activities: development of treatment plan with patient and/or surrogate as well as nursing, discussions with consultants, evaluation of patient's response to treatment, examination of patient, obtaining history from patient or surrogate, ordering and performing treatments and interventions, ordering and review of laboratory studies, ordering and review of radiographic studies, pulse oximetry and re-evaluation of patient's condition.   Final Clinical Impression(s) / ED Diagnoses Final diagnoses:  Delirium  AKI (acute kidney injury) (HCC)  Hyperkalemia     Dorenda Gandy, MD 03/30/24 1512    Dorenda Gandy, MD 03/30/24 1530

## 2024-03-30 NOTE — Progress Notes (Addendum)
 Went to evaluate patient as she noticed that she was having some left side facial swelling and a rash to her left face. She states it has been present since before she came to the hospital. She denies it being itchy and she denies any new creams or detergents or facial washes. She has not been hiking. On exam she has a red patch noted to the left medical portion of her face that does not cross the nasal midline. No warmth noted and no drainage noted. While examining the patient she became nauseated and had to go to the bathroom to vomit. I ordered some Zofran  for her and likely the erythematous patch is likely an insect bite. Continue conservative approach at this time.

## 2024-03-30 NOTE — H&P (Cosign Needed Addendum)
 Date: 03/30/2024               Patient Name:  Angela Manning MRN: [id number]  DOB: 1986/05/17 Age / Sex: 38 y.o., female   PCP: Lasandra Points, MD (Inactive)         Medical Service: Internal Medicine Teaching Service         Attending Physician: Dr. Dorenda Gandy, MD      First Contact: Dr. Jayson Michael, MD Pager [phone]    Second Contact: Dr Lorelle Roll         After Hours (After 5p/  First Contact Pager: [phone]  weekends / holidays): Second Contact Pager: [phone]   SUBJECTIVE   Chief Complaint: AMS  History of Present Illness:  Patient is a 38 yo F with history significant for Grave's disease with previous admission for thyrotoxicosis, HTN, presenting with altered mental status. Patient reports yesterday she was at a friend's house where she had several alcoholic drinks. Shortly after she started feeling tired and overheated and laid down on the couch. This was the last thing she remembers before she woke up briefly in the ambulance and then in the ED. Prior to this she was in her usual state of health. She reports numbness in her 4th and 5th fingers of her left hand, left side of her face and in her left foot. Denies chest pain, shortness of breath, palpitations, anxiety.   Per EMS report, patient initially admitted to narcotic usage as well as drinking a few bottles of wine last night. She was given dextrose  for a CBG of 55 as well as narcan.   Notably, she was recently admitted to IMTS for new diagnosis of Grave's disease with thyrotoxicosis as well as pericarditis. She was discharged with methimazole , which she claims she has taken every day since discharge.   ED Course: Afeb, tachycardic 90-100s, normotensive A&O x 3  WBC 23 Ethanol <15 TSH <0.01 K: 5.6 CT head negative CXR negative  Past Medical History: Past Medical History:  Diagnosis Date   Bacterial vaginosis    Hypertension    Hypertension affecting pregnancy, antepartum 10/27/2019   Ovarian cyst     Preeclampsia 09/10/2023   Pregnancy induced hypertension    Primary hypertension 03/03/2020   Meds:  Methiamize 20 mg BID -- only taking once a day  Colchicine  0.6 mg bid Amlodipine  10mg  Atenolol  25mg  Spironolactone  25mg  Lisinopril  10mg   Past Surgical History:  Procedure Laterality Date   DILATION AND EVACUATION N/A 02/21/2013   Procedure: DILATATION AND EVACUATION;  Surgeon: Granville Layer, MD;  Location: [facility];  Service: Gynecology;  Laterality: N/A;   Social:  Living Situation: home with 4 sons PCP: Lasandra Points, MD (Inactive) Tobacco: none Alcohol: rare alcohol use Drugs: none  Family History: noncontributory  Allergies: Allergies as of 03/30/2024 - Review Complete 03/30/2024  Allergen Reaction Noted   Flagyl  [metronidazole ] Rash and Other (See Comments) 12/26/2015   Review of Systems: A complete ROS was negative except as per HPI.   OBJECTIVE:   Physical Exam: Blood pressure 111/77, pulse 97, temperature (!) 97.4 F (36.3 C), temperature source Oral, resp. rate 15, height 5\' 7"  (1.702 m), weight 63.5 kg, SpO2 96%.  Constitutional: fatigued appearing. No acute distress. Fell asleep during exam. Photophobia Cardiovascular: RRR Pulmonary/Chest: CTAB. No respiratory distress Abdominal: soft, nontender, nondistended Neurological: 5/5 strength in bilateral upper and lower extremities. Decreased sensation to light touch of left midface, left 4th and 5th fingers, left foot  Labs: CBC  Component Value Date/Time   WBC 23.1 (H) 03/30/2024 1221   RBC 4.51 03/30/2024 1221   HGB 10.7 (L) 03/30/2024 1221   HGB 11.4 02/26/2024 1023   HCT 35.0 (L) 03/30/2024 1221   HCT 35.7 02/26/2024 1023   PLT 268 03/30/2024 1221   PLT 247 02/26/2024 1023   MCV 77.6 (L) 03/30/2024 1221   MCV 74 (L) 02/26/2024 1023   MCH 23.7 (L) 03/30/2024 1221   MCHC 30.6 03/30/2024 1221   RDW 16.0 (H) 03/30/2024 1221   RDW 12.5 02/26/2024 1023   LYMPHSABS 0.6 (L) 03/30/2024 1221    LYMPHSABS 1.4 02/26/2024 1023   MONOABS 1.0 03/30/2024 1221   EOSABS 0.0 03/30/2024 1221   EOSABS 0.2 02/26/2024 1023   BASOSABS 0.0 03/30/2024 1221   BASOSABS 0.0 02/26/2024 1023     CMP     Component Value Date/Time   NA 139 03/30/2024 1221   NA 138 09/18/2023 1158   K 5.6 (H) 03/30/2024 1221   CL 107 03/30/2024 1221   CO2 17 (L) 03/30/2024 1221   GLUCOSE 155 (H) 03/30/2024 1221   BUN 21 (H) 03/30/2024 1221   BUN 11 09/18/2023 1158   CREATININE 1.38 (H) 03/30/2024 1221   CALCIUM 8.8 (L) 03/30/2024 1221   PROT 6.7 03/30/2024 1221   ALBUMIN 3.6 03/30/2024 1221   AST 47 (H) 03/30/2024 1221   ALT 39 03/30/2024 1221   ALKPHOS 107 03/30/2024 1221   BILITOT <0.2 03/30/2024 1221   GFRNONAA 51 (L) 03/30/2024 1221   GFRAA >60 08/23/2019 0444   Imaging: CT HEAD WO CONTRAST Result Date: 03/30/2024 CLINICAL DATA:  Mental status change, unknown cause.  Unresponsive EXAM: CT HEAD WITHOUT CONTRAST TECHNIQUE: Contiguous axial images were obtained from the base of the skull through the vertex without intravenous contrast. RADIATION DOSE REDUCTION: This exam was performed according to the departmental dose-optimization program which includes automated exposure control, adjustment of the mA and/or kV according to patient size and/or use of iterative reconstruction technique. COMPARISON:  05/07/2023 FINDINGS: Brain: No acute intracranial abnormality. Specifically, no hemorrhage, hydrocephalus, mass lesion, acute infarction, or significant intracranial injury. Vascular: No hyperdense vessel or unexpected calcification. Skull: No acute calvarial abnormality. Sinuses/Orbits: No acute findings Other: None IMPRESSION: No acute intracranial abnormality. Electronically Signed   By: Janeece Mechanic M.D.   On: 03/30/2024 14:38   DG Chest Port 1 View Result Date: 03/30/2024 CLINICAL DATA:  ALOC EXAM: PORTABLE CHEST 1 VIEW COMPARISON:  None Available. FINDINGS: The heart size and mediastinal contours are within  normal limits. Both lungs are clear. The visualized skeletal structures are unremarkable. IMPRESSION: No active disease. Electronically Signed   By: Fredrich Jefferson M.D.   On: 03/30/2024 14:09    ASSESSMENT & PLAN:   ROSILYN COACHMAN is a 38 y.o. person living with a history of Grave's disease who presented with confusion and admitted for workup of altered mental status on hospital day 0  Altered mental status Currently A&O x 3, though significantly drowsy with poor recollection of events leading up to coming to the ED. CT head with no intracranial abnormality. T4 only slightly elevated, no reason to suspect thyrotoxicosis. No other obvious abnormalities on metabolic panel that could be causing altered mental status. Intoxicants, whether intentional or otherwise is possible. Low suspicion for infection as patient is afebrile and otherwise symptomatic though with significantly elevated WBC. CXR negative - UDS, extended urine drugs of abuse panel pending - UA ordered  Grave's disease Hx thyrotoxicosis With relatively normal  T4, low suspicion for current thyrotoxicosis.  - Continue home methimazole  20mg  BID, atenolol  25mg  daily  Hyperkalemia K initially elevated to 5.6. Received Lokelma  5g x 2 in the ED. Some peaked T waves on EKG, no other acute changes from previous.  - Trend with daily labs  AKI Creatinine 1.38 from baseline <1. Unsure etiology, patient reports normal po intake.  - Encourage po intake - Trend creatinine with daily labs  Leukocytosis WBC 23. Less than 10 during previous admissions. Unknown etiology. Overall low suspicion for infectious source given history and current presentation. Possibly reactive in the setting of whatever insult is causing current symptoms.  - Trend CBC  Hypertension Currently normotensive - Restart home amlodipine  10mg , atenolol  25mg , lisinopril  10mg , spironolactone  25mg  starting tomorrow  Hx pericarditis Patient denying any current chest pain or  cardiac symptoms - Continue home colchicine  0.6mg  BID, planned for 3 months from 3/23  Diet: Regular  VTE: Lovenox  Code: Full  Prior to Admission Living Arrangement: Home Anticipated Discharge Location: home Barriers to Discharge: pending medical stability  Signed: Jayson Michael, MD Internal Medicine Resident PGY-1  03/30/2024, 4:04 PM

## 2024-03-30 NOTE — ED Notes (Signed)
 Pt taken to restroom by Ruthann Cover NT but pt unable to urinate at this time.

## 2024-03-30 NOTE — ED Triage Notes (Signed)
 Pt presents to ED via EMS from home. EMS called out for unresponsiveness. Pt agonal respirations on EMS arrival. Pt bagged for approximately 10 minutes. Pinpoint pupils on arrival. Initial CBG 55, 25 mg d10 given, follow-up CBG 227. 1.5 mg narcan given. ETOH on board, few bottles of wine. Initially admitted to narcotic usage but then denies.

## 2024-03-30 NOTE — Hospital Course (Signed)
 Angela Manning is a 38 yo female with   Had syncope with AMS OD? Endocrine?  Back and mentating fine  Not overtly infected

## 2024-03-31 ENCOUNTER — Other Ambulatory Visit (HOSPITAL_COMMUNITY): Payer: Self-pay

## 2024-03-31 DIAGNOSIS — E875 Hyperkalemia: Secondary | ICD-10-CM | POA: Diagnosis not present

## 2024-03-31 DIAGNOSIS — N179 Acute kidney failure, unspecified: Secondary | ICD-10-CM | POA: Diagnosis not present

## 2024-03-31 DIAGNOSIS — R404 Transient alteration of awareness: Secondary | ICD-10-CM | POA: Diagnosis not present

## 2024-03-31 HISTORY — DX: Acute kidney failure, unspecified: N17.9

## 2024-03-31 LAB — RAPID URINE DRUG SCREEN, HOSP PERFORMED
Amphetamines: NOT DETECTED
Barbiturates: NOT DETECTED
Benzodiazepines: NOT DETECTED
Cocaine: POSITIVE — AB
Opiates: NOT DETECTED
Tetrahydrocannabinol: NOT DETECTED

## 2024-03-31 LAB — BASIC METABOLIC PANEL WITH GFR
Anion gap: 10 (ref 5–15)
BUN: 14 mg/dL (ref 6–20)
CO2: 23 mmol/L (ref 22–32)
Calcium: 8.5 mg/dL — ABNORMAL LOW (ref 8.9–10.3)
Chloride: 103 mmol/L (ref 98–111)
Creatinine, Ser: 0.84 mg/dL (ref 0.44–1.00)
GFR, Estimated: >60 mL/min (ref >60)
Glucose, Bld: 90 mg/dL (ref 70–99)
Potassium: 4.3 mmol/L (ref 3.5–5.1)
Sodium: 136 mmol/L (ref 135–145)

## 2024-03-31 LAB — URINALYSIS, ROUTINE W REFLEX MICROSCOPIC
Bilirubin Urine: NEGATIVE
Glucose, UA: NEGATIVE mg/dL
Hgb urine dipstick: NEGATIVE
Ketones, ur: NEGATIVE mg/dL
Leukocytes,Ua: NEGATIVE
Nitrite: NEGATIVE
Protein, ur: NEGATIVE mg/dL
Specific Gravity, Urine: 1.006 (ref 1.005–1.030)
pH: 6 (ref 5.0–8.0)

## 2024-03-31 LAB — CBC
HCT: 29.9 % — ABNORMAL LOW (ref 36.0–46.0)
Hemoglobin: 9.3 g/dL — ABNORMAL LOW (ref 12.0–15.0)
MCH: 23.2 pg — ABNORMAL LOW (ref 26.0–34.0)
MCHC: 31.1 g/dL (ref 30.0–36.0)
MCV: 74.6 fL — ABNORMAL LOW (ref 80.0–100.0)
Platelets: 213 10*3/uL (ref 150–400)
RBC: 4.01 MIL/uL (ref 3.87–5.11)
RDW: 15.9 % — ABNORMAL HIGH (ref 11.5–15.5)
WBC: 11 10*3/uL — ABNORMAL HIGH (ref 4.0–10.5)
nRBC: 0 % (ref 0.0–0.2)

## 2024-03-31 MED ORDER — CARMEX CLASSIC LIP BALM EX OINT
TOPICAL_OINTMENT | CUTANEOUS | Status: DC | PRN
  Filled 2024-03-31: qty 10

## 2024-03-31 MED ORDER — AMOXICILLIN-POT CLAVULANATE 875-125 MG PO TABS
1.0000 | ORAL_TABLET | Freq: Two times a day (BID) | ORAL | 0 refills | Status: DC
  Filled 2024-03-31: qty 8, 4d supply, fill #0

## 2024-03-31 MED ORDER — DIPHENHYDRAMINE HCL 25 MG PO CAPS
25.0000 mg | ORAL_CAPSULE | Freq: Once | ORAL | Status: AC
  Administered 2024-03-31: 25 mg via ORAL
  Filled 2024-03-31: qty 1

## 2024-03-31 MED ORDER — AMOXICILLIN-POT CLAVULANATE 875-125 MG PO TABS
1.0000 | ORAL_TABLET | Freq: Two times a day (BID) | ORAL | Status: DC
  Administered 2024-03-31: 1 via ORAL
  Filled 2024-03-31: qty 1

## 2024-03-31 NOTE — TOC Initial Note (Signed)
 Transition of Care [facility]) - Initial/Assessment Note    Patient Details  Name: Angela Manning MRN: [id number] Date of Birth: 01/10/1986  [facility]) CM/SW Contact:    Juliane Och, LCSW Phone Number: 03/31/2024, 10:07 AM  Clinical Narrative:                  10:07 AM CSW introduced self and role to patient at bedside. Patient's significant other was also present at bedside. Patient consented CSW to speak in front of significant other. Patient confirmed she resides at home with significant other and three minor children. Patient confirmed she is to return home upon discharge via significant other, who could provide home support if needed, as well. Patient declined SNF/HH/DME history. No TOC needs identified at this time.  Expected Discharge Plan: Home/Self Care Barriers to Discharge: Continued Medical Work up   Patient Goals and CMS Choice Patient states their goals for this hospitalization and ongoing recovery are:: to return home          Expected Discharge Plan and Services       Living arrangements for the past 2 months: Single Family Home                                      Prior Living Arrangements/Services Living arrangements for the past 2 months: Single Family Home Lives with:: Minor Children, Significant Other Patient language and need for interpreter reviewed:: Yes Do you feel safe going back to the place where you live?: Yes      Need for Family Participation in Patient Care: No (Comment) Care giver support system in place?: Yes (comment)   Criminal Activity/Legal Involvement Pertinent to Current Situation/Hospitalization: No - Comment as needed  Activities of Daily Living   ADL Screening (condition at time of admission) Independently performs ADLs?: No Does the patient have a NEW difficulty with bathing/dressing/toileting/self-feeding that is expected to last >3 days?: No Does the patient have a NEW difficulty with getting in/out  of bed, walking, or climbing stairs that is expected to last >3 days?: No Does the patient have a NEW difficulty with communication that is expected to last >3 days?: No Is the patient deaf or have difficulty hearing?: No Does the patient have difficulty seeing, even when wearing glasses/contacts?: No Does the patient have difficulty concentrating, remembering, or making decisions?: No  Permission Sought/Granted Permission sought to share information with : Family Supports Permission granted to share information with : No (Contact information on chart)  Share Information with NAME: Mae Denunzio     Permission granted to share info w Relationship: Grandmother  Permission granted to share info w Contact Information: [phone]  Emotional Assessment Appearance:: Appears stated age Attitude/Demeanor/Rapport: Engaged Affect (typically observed): Accepting, Pleasant, Adaptable, Calm, Appropriate, Stable Orientation: : Oriented to Self, Oriented to Place, Oriented to  Time, Oriented to Situation Alcohol / Substance Use: Not Applicable Psych Involvement: No (comment)  Admission diagnosis:  Hyperkalemia [E87.5] Delirium [R41.0] Altered mental status [R41.82] AKI (acute kidney injury) (HCC) [N17.9] Patient Active Problem List   Diagnosis Date Noted   Altered mental status 03/30/2024   Acute pericarditis 02/29/2024   Thyrotoxicosis due to Graves' disease 02/29/2024   Hyperthyroidism 02/27/2024   Resistant hypertension 02/27/2024   Chest pain 02/27/2024   Weakness of both lower extremities 02/09/2024   Thyroid  enlargement 02/09/2024   Abnormal Pap smear of cervix 10/23/2021  Generalized anxiety disorder 04/19/2021   Primary hypertension 03/03/2020   Ovarian cyst, left 03/01/2015   Menorrhagia with irregular cycle 03/01/2015   Dyspareunia 03/01/2015   PCP:  Lasandra Points, MD (Inactive) Pharmacy:   [phone] [address], [place] - [address] [place] [address] Phone: [phone] Fax: [phone]     Social Drivers of Health (SDOH) Social History: SDOH Screenings   Food Insecurity: No Food Insecurity (03/30/2024)  Housing: Unknown (03/30/2024)  Transportation Needs: No Transportation Needs (03/30/2024)  Utilities: Not At Risk (03/30/2024)  Alcohol Screen: Low Risk  (09/10/2023)  Depression (PHQ2-9): Low Risk  (02/26/2024)  Financial Resource Strain: Medium Risk (05/07/2023)  Physical Activity: Inactive (09/10/2023)  Social Connections: Moderately Integrated (03/30/2024)  Tobacco Use: Medium Risk (03/30/2024)   SDOH Interventions:     Readmission Risk Interventions     No data to display

## 2024-03-31 NOTE — Progress Notes (Signed)
                  Subjective:   Summary: 38 yo F with hx of Grave's disease w/previous admission for thyrotoxicosis, admitted for altered mental status in the setting of alcohol use, now improved Hospital Day 1  Patient feels her mental status is at baseline today, friend at the bedside agrees. Her primary concern now is swelling of her left face. She denies insect sting or bite. She is amenable to being discharged today if her symptoms continue to improve.   Objective:  Vital signs in last 24 hours: Vitals:   03/30/24 1923 03/30/24 1957 03/31/24 0359 03/31/24 0737  BP: 129/88 129/88 (!) 126/92 123/86  Pulse: 84 84 84 75  Resp: 16  16   Temp: 97.7 F (36.5 C)  98.1 F (36.7 C) 98.1 F (36.7 C)  TempSrc:      SpO2: 98%  92% 98%  Weight:      Height:       Supplemental O2: Room Air SpO2: 98 %   Physical Exam:  Constitutional: well appearing, no acute distress. Normoactive, improved from yesterday HEENT: edema of L cheek surround the eye. Small crusting, no discharge. EOM and pupillary response intact.  Neuro: A&O x 3  Assessment/Plan:   Altered mental status, resolved Patient is at her baseline mental status today. Initially altered mentation appears likely due to whatever alcohol or other intoxicants she consumed. UDS still pending, though encouraged by spontaneous improvement. She can be discharged today if her symptoms and new facial swelling continue to improve.   L facial swelling Preseptal cellulitis Patient has had new left facial swelling around her eye. EOM and pupillary responses are intact. It appears like focal swelling due to perhaps insect bite or sting, though she denies any bites and there is no central punctate lesion. She has received diphenhydramine  25mg  x 2 with little improvement in her symptoms. We will treat empirically for preseptal cellulitis - Augmentin  875-125mg  BID for anticipated 5 day course - Discontinue lisinopril  in case of angioedema - S/p  benadryl  25mg  x 2  Grave's disease Hx thyrotoxicosis Continue home methimazole  20mg  BID. No evidence of thyrotoxicosis.   Hyperkalemia K improved today at 4.3 from 5.6 at admission after receiving Lokelma  5mg  x 2. Continue to monitor.   Leukocytosis, improved WBC 11 today from 23 at admission. Unsure etiology of initial elevation, possibly reactive to some insult.   AKI, resolved Creatinine at baseline today, 0.84. Continue to monitor.   Hx pericarditis: Asymptomatic. Continuing home colchicine  0.6mg  BID.  Hypertension: Continue home amlodipine  10mg , atenolol  25mg , spironolactone  25mg . Holding lisinopril   Diet: Regular VTE: Lovenox  Code: Full  Dispo: Anticipated discharge to Home pending medical stability.   Jayson Michael, MD PGY-1 Internal Medicine Resident Pager Number [phone] Please contact the on call pager after 5 pm and on weekends at [phone].

## 2024-03-31 NOTE — Discharge Summary (Cosign Needed Addendum)
 Name: Angela Manning MRN: [id number] DOB: 06/22/1986 38 y.o. PCP: Lasandra Points, MD (Inactive)  Date of Admission: 03/30/2024 12:16 PM Date of Discharge: 03/31/24 Attending Physician: Dr. Lelia Putnam  Discharge Diagnosis: Principal Problem:   Altered mental status Active Problems:   AKI (acute kidney injury) (HCC)   Hyperkalemia    Discharge Medications: Allergies as of 03/31/2024       Reactions   Flagyl  [metronidazole ] Rash, Other (See Comments)   "Breaks out in blisters"        Medication List     STOP taking these medications    lisinopril  10 MG tablet Commonly known as: ZESTRIL        TAKE these medications    amLODipine  10 MG tablet Commonly known as: NORVASC  Take 1 tablet (10 mg total) by mouth daily.   amoxicillin -clavulanate 875-125 MG tablet Commonly known as: AUGMENTIN  Take 1 tablet by mouth 2 (two) times daily.   atenolol  25 MG tablet Commonly known as: TENORMIN  Take 1 tablet (25 mg total) by mouth daily.   colchicine  0.6 MG tablet Take 1 tablet (0.6 mg total) by mouth 2 (two) times daily.   ibuprofen  600 MG tablet Commonly known as: ADVIL  Take 1 tablet (600 mg total) by mouth 3 (three) times daily.   methimazole  10 MG tablet Commonly known as: TAPAZOLE  Take 2 tablets (20 mg total) by mouth 2 (two) times daily.   pantoprazole  40 MG tablet Commonly known as: Protonix  Take 1 tablet (40 mg total) by mouth daily.   spironolactone  25 MG tablet Commonly known as: Aldactone  Take 1 tablet (25 mg total) by mouth daily.   sucralfate  1 g tablet Commonly known as: Carafate  Take 1 tablet (1 g total) by mouth 4 (four) times daily.        Disposition and follow-up:   Angela Manning was discharged from [facility] in Stable condition.  At the hospital follow up visit please address:  1.  Follow-up:  Preseptal cellulitis ?: developed L facial swelling during admission, possibly due to preseptal cellulitis. Started 5 day  course of Augmentin . Evaluate for improvement/worsening of symptoms. See picture in chart.    HTN: Held lisinopril  due to possible concern for angioedema. Check BP and add on additional agent as needed  2.  Labs / imaging needed at time of follow-up: None  3.  Pending labs/ test needing follow-up: None  4.  Medication Changes  Started:   Stopped: lisinopril   Changed:  Abx -  Augmentin  875-125mg  BID End Date: 4/27  Follow-up Appointments: [facility] on 4/30 at [facility] Course by problem list:   Altered mental status Patient presented to the ED due to altered mental status. Per the patient, she had several alcoholic drinks the previous night, after which she felt ill and fell asleep. This was the last thing she remembered before waking up in the ambulance. Per EMS report, she admitted to drinking several bottles of wine as well as narcotic use. She received dextrose  and narcan en route to the hospital. In the ED patient was HDS and A&O x 3, though very drowsy. On hospital day 1 she was at her baseline mental status and feeling better. She was discharged with [facility] follow up.   L facial swelling Preseptal cellulitis During the admission patient developed L facial swelling around her eye. EOM and pupillary response were intact. She had mild blurry peripheral vision. No significant discharge or crusting. No significant change with oral benadryl  x 2. She  was treated empirically for preseptal cellulitis with Augmentin  875-125mg  BID for 5 days, to be completed 4/27. Will hold lisinopril  as well may not have contributed but also a poor medication choice can add back ARB later if concern is low for ACEI causation and good response to abx.    Grave's disease Hx thyrotoxicosis On admission TSH and free T4 were within normal limits. Home methimazole  was continued.   Hyperkalemia Mildly hyperkalemic on admission, received lokelma  with improvement in potassium level.   Leukocytosis WBC initially  elevated to 23. On hospital day one WBC had spontaneously improved to 11. Possibly reactive due to whatever intoxicants were consumed leading to her altered mental status.   AKI Creatinine initially elevated to 1.38. Returned to baseline by hospital day 1.   Hx pericarditis Home colchicine  was resumed to continue planned 3 month course following an episode of acute pericarditis.   Hypertension History of treatment resistant hypertension. Home antihypertensive regimen was resumed during admission, however lisinopril  was discontinued at discharge to eliminate possible causes of facial swelling. PCP will need to check BP at hospital follow up and adjust antihypertensive regimen accordingly.    Discharge Subjective: Patient feels her mental status is at baseline today, friend at the bedside agrees. Her primary concern now is swelling of her left face. She denies insect sting or bite. She is amenable to being discharged today if her symptoms continue to improve.   Discharge Exam:   BP 123/86 (BP Location: Left Arm)   Pulse 75   Temp 98.1 F (36.7 C)   Resp 16   Ht 5\' 7"  (1.702 m)   Wt 63.5 kg   LMP  (LMP Unknown)   SpO2 98%   BMI 21.93 kg/m  Constitutional: well-appearing, in no acute distress HEENT: edema of L face surrounding the eye. No loss of EOM or pupillary response Neurological: alert & oriented x 3 Skin: warm and dry  Pertinent Labs, Studies, and Procedures:     Latest Ref Rng & Units 03/31/2024    4:57 AM 03/30/2024   12:21 PM 03/02/2024   11:10 AM  CBC  WBC 4.0 - 10.5 K/uL 11.0  23.1  5.3   Hemoglobin 12.0 - 15.0 g/dL 9.3  13.0  86.5   Hematocrit 36.0 - 46.0 % 29.9  35.0  37.7   Platelets 150 - 400 K/uL 213  268  269        Latest Ref Rng & Units 03/31/2024    4:57 AM 03/30/2024   12:21 PM 03/02/2024   11:10 AM  CMP  Glucose 70 - 99 mg/dL 90  784  91   BUN 6 - 20 mg/dL 14  21  24    Creatinine 0.44 - 1.00 mg/dL 6.96  2.95  2.84   Sodium 135 - 145 mmol/L 136  139   139   Potassium 3.5 - 5.1 mmol/L 4.3  5.6  4.8   Chloride 98 - 111 mmol/L 103  107  109   CO2 22 - 32 mmol/L 23  17  21    Calcium 8.9 - 10.3 mg/dL 8.5  8.8  13.2   Total Protein 6.5 - 8.1 g/dL  6.7  6.6   Total Bilirubin 0.0 - 1.2 mg/dL  <4.4  0.6   Alkaline Phos 38 - 126 U/L  107  113   AST 15 - 41 U/L  47  38   ALT 0 - 44 U/L  39  57     CT HEAD WO  CONTRAST Result Date: 03/30/2024 CLINICAL DATA:  Mental status change, unknown cause.  Unresponsive EXAM: CT HEAD WITHOUT CONTRAST TECHNIQUE: Contiguous axial images were obtained from the base of the skull through the vertex without intravenous contrast. RADIATION DOSE REDUCTION: This exam was performed according to the departmental dose-optimization program which includes automated exposure control, adjustment of the mA and/or kV according to patient size and/or use of iterative reconstruction technique. COMPARISON:  05/07/2023 FINDINGS: Brain: No acute intracranial abnormality. Specifically, no hemorrhage, hydrocephalus, mass lesion, acute infarction, or significant intracranial injury. Vascular: No hyperdense vessel or unexpected calcification. Skull: No acute calvarial abnormality. Sinuses/Orbits: No acute findings Other: None IMPRESSION: No acute intracranial abnormality. Electronically Signed   By: Janeece Mechanic M.D.   On: 03/30/2024 14:38   DG Chest Port 1 View Result Date: 03/30/2024 CLINICAL DATA:  ALOC EXAM: PORTABLE CHEST 1 VIEW COMPARISON:  None Available. FINDINGS: The heart size and mediastinal contours are within normal limits. Both lungs are clear. The visualized skeletal structures are unremarkable. IMPRESSION: No active disease. Electronically Signed   By: Fredrich Jefferson M.D.   On: 03/30/2024 14:09    Signed: Jayson Michael, MD PGY-1 03/31/2024, 2:11 PM   Pager: [phone]

## 2024-03-31 NOTE — Progress Notes (Signed)
 Evaluated patient at bedside again as patient was having more swelling noted under left eye. Patient reports that vision was also getting a little blurry. She denies any drainage or warmth. She reports having some pain to the area. On exam periorbital area does having increased swelling noted. EOM is intact with no pain with EOM. Vision intact as well. No drainage on my exam. Do not think this is periorbital cellulitis or orbital cellulitis. Likely an insect bite causing a reaction. Will give one dose of benadryl  and recommend ice to the area. Informed RN of my plan who went to get patient ice.

## 2024-03-31 NOTE — Discharge Instructions (Addendum)
 Ms Jasmarie, Coppock were hospitalized for confusion and facial swelling. At this time your confusion is much better, and I feel comfortable discharging you with outpatient follow up. I have additionally prescribed an antibiotic called Augmentin  to treat what might be a skin infection. Thank you for allowing us  to be part of your care.   We arranged for you to follow up at: [facility] on 4/30 at 10:45 with Dr Rozelle Corning  Please note these changes made to your medications:   *Please START taking:  Augmentin  375-125mg  twice daily for four more days, ending 4/27 I also recommend a daily oral antihistamine to help with the swelling. You can get this over the counter at any pharmacy.   *Please STOP taking:  Lisinopril , as this may contribute to facial swelling  Please make sure to return to the hospital if you develop a fever or worsening facial swelling or pain.   Please call our clinic if you have any questions or concerns, we may be able to help and keep you from a long and expensive emergency room wait. Our clinic and after hours phone number is [phone], the best time to call is Monday through Friday 9 am to 4 pm but there is always someone available 24/7 if you have an emergency. If you need medication refills please notify your pharmacy one week in advance and they will send us  a request.

## 2024-04-07 ENCOUNTER — Encounter: Admitting: Internal Medicine

## 2024-04-07 NOTE — Progress Notes (Deleted)
 Patient hospitalized 04/22-0404/23 2/2 AMS, and after gathering more information about events preceding hospital presentation, she endorsed several alcoholic beverages (multiple bottles of wine) in addition to a narcotic medication.   Plan:  Preseptal cellulitis Patient developed L facial swelling and periorbital swelling during her hospital stay. Due to concern for preseptal cellulitis she was treated with a 5 day course of augmentin  875-125 mg BID with last day 04/27. Lisinopril  was held due to concern that it may have contributed.  Plan:    HTN BP ***. OP regimen is currently amlodipine  10 mg daily, spironolactone  25 mg daily, atenolol  25 mg daily and lisinopril  was held at discharge due to concerns for angioedema.*** BMP last checked 0/2023 with normal renal function. Plan:  Thyroid  disease During recent admission thyroid  function remained altered with TSH of 1.94*** and T4 of <0.010. Her home methimazole  was continued for admission. Plan:

## 2024-04-08 ENCOUNTER — Encounter: Admitting: Internal Medicine

## 2024-04-10 ENCOUNTER — Ambulatory Visit (HOSPITAL_COMMUNITY)
Admission: EM | Admit: 2024-04-10 | Discharge: 2024-04-10 | Disposition: A | Discharge status: Home / Self Care | Attending: Physician Assistant | Admitting: Physician Assistant

## 2024-04-10 ENCOUNTER — Encounter (HOSPITAL_COMMUNITY): Payer: Self-pay | Admitting: Emergency Medicine

## 2024-04-10 ENCOUNTER — Other Ambulatory Visit: Payer: Self-pay

## 2024-04-10 DIAGNOSIS — I10 Essential (primary) hypertension: Secondary | ICD-10-CM | POA: Diagnosis not present

## 2024-04-10 DIAGNOSIS — N760 Acute vaginitis: Secondary | ICD-10-CM | POA: Diagnosis not present

## 2024-04-10 DIAGNOSIS — M25572 Pain in left ankle and joints of left foot: Secondary | ICD-10-CM | POA: Diagnosis not present

## 2024-04-10 MED ORDER — PREDNISONE 20 MG PO TABS: 40.0000 mg | ORAL_TABLET | Freq: Every day | ORAL | 0 refills | Status: AC

## 2024-04-10 MED ORDER — ATENOLOL 25 MG PO TABS
25.0000 mg | ORAL_TABLET | Freq: Every day | ORAL | 0 refills | Status: DC
Start: 2024-04-10 — End: 2024-05-04

## 2024-04-10 NOTE — ED Provider Notes (Signed)
 [facility]   MRN: [id number] DOB: 1985/12/29  Subjective:   Angela Manning is a 38 y.o. female presenting for mainly for concerns of possible recurrent bacterial vaginosis infection.  Patient reports that she has vaginal discharge that started last night.  It is clear and has some odor to it.  She reports that she has had several episodes of BV in the past and she is very confident she has another infection.  She is requesting to do a self swab here at the urgent care.  She is sexually active.  She denies any new partners, but is open to STD testing.  Her last menstrual period was on the 13th of last month.  She denies chance of pregnancy.  She denies any urinary symptoms.  No flank pain.  Slight lower abdominal pain.  No diarrhea.  No nausea or vomiting.  Patient also reports that she ran out of atenolol  and is requesting a prescription refill to get her to her primary care provider appointment on the 19th of this month.  She reports that she is compliant with her blood pressure medication on a regular basis, except for today she did forget to take her medicine.  She reports she checks her blood pressure at home and it is usually running in a normal range.  She denies any headache.  No chest pain or shortness of breath.  She had a recent hospitalization a few weeks ago, but patient reports that she has been feeling fine since that hospitalization.  She will follow-up with her PCP as scheduled.  Patient also reports a history of gout.  She states that her left ankle has been feeling sore and somewhat swollen.  Denies any recent injury.  She would like something to take to help with the pain in her left ankle.  No current facility-administered medications for this encounter.  Current Outpatient Medications:    predniSONE (DELTASONE) 20 MG tablet, Take 2 tablets (40 mg total) by mouth daily with breakfast for 5 days., Disp: 10 tablet, Rfl: 0   amLODipine  (NORVASC ) 10 MG tablet,  Take 1 tablet (10 mg total) by mouth daily., Disp: 90 tablet, Rfl: 2   amoxicillin -clavulanate (AUGMENTIN ) 875-125 MG tablet, Take 1 tablet by mouth 2 (two) times daily., Disp: 8 tablet, Rfl: 0   atenolol  (TENORMIN ) 25 MG tablet, Take 1 tablet (25 mg total) by mouth daily., Disp: 30 tablet, Rfl: 0   colchicine  0.6 MG tablet, Take 1 tablet (0.6 mg total) by mouth 2 (two) times daily., Disp: 180 tablet, Rfl: 0   ibuprofen  (ADVIL ) 600 MG tablet, Take 1 tablet (600 mg total) by mouth 3 (three) times daily., Disp: 30 tablet, Rfl: 0   methimazole  (TAPAZOLE ) 10 MG tablet, Take 2 tablets (20 mg total) by mouth 2 (two) times daily., Disp: 120 tablet, Rfl: 2   pantoprazole  (PROTONIX ) 40 MG tablet, Take 1 tablet (40 mg total) by mouth daily. (Patient not taking: Reported on 03/30/2024), Disp: 30 tablet, Rfl: 0   spironolactone  (ALDACTONE ) 25 MG tablet, Take 1 tablet (25 mg total) by mouth daily., Disp: 90 tablet, Rfl: 3   sucralfate  (CARAFATE ) 1 g tablet, Take 1 tablet (1 g total) by mouth 4 (four) times daily. (Patient not taking: Reported on 03/30/2024), Disp: 30 tablet, Rfl: 0   Allergies  Allergen Reactions   Flagyl  [Metronidazole ] Rash and Other (See Comments)    "Breaks out in blisters"    Past Medical History:  Diagnosis Date  Bacterial vaginosis    Hypertension    Hypertension affecting pregnancy, antepartum 10/27/2019   Ovarian cyst    Preeclampsia 09/10/2023   Pregnancy induced hypertension    Primary hypertension 03/03/2020     Past Surgical History:  Procedure Laterality Date   DILATION AND EVACUATION N/A 02/21/2013   Procedure: DILATATION AND EVACUATION;  Surgeon: Granville Layer, MD;  Location: [facility];  Service: Gynecology;  Laterality: N/A;    Family History  Problem Relation Age of Onset   Stroke Mother    Hypertension Mother    Valvular heart disease Mother    Healthy Father    Hypertension Maternal Grandmother    Hypertension Paternal Grandmother     Social History    Tobacco Use   Smoking status: Former    Current packs/day: 0.10    Types: Cigarettes   Smokeless tobacco: Former    Quit date: 01/08/2015   Tobacco comments:    Smokes sometimes.  Vaping Use   Vaping status: Never Used  Substance Use Topics   Alcohol use: Not Currently   Drug use: Not Currently    ROS REFER TO HPI FOR PERTINENT POSITIVES AND NEGATIVES   Objective:   Vitals: BP (!) 152/108 (BP Location: Right Arm) Comment: repositioned.  patient has not had medications today  Pulse 99   Temp 98.2 F (36.8 C) (Oral)   Resp 18   LMP 03/21/2024 (Approximate)   SpO2 98%   Physical Exam Vitals and nursing note reviewed.  Constitutional:      Appearance: Normal appearance. She is normal weight. She is not toxic-appearing.  HENT:     Head: Normocephalic and atraumatic.     Mouth/Throat:     Mouth: Mucous membranes are moist.  Eyes:     Extraocular Movements: Extraocular movements intact.     Conjunctiva/sclera: Conjunctivae normal.     Pupils: Pupils are equal, round, and reactive to light.  Cardiovascular:     Rate and Rhythm: Normal rate and regular rhythm.     Pulses: Normal pulses.     Heart sounds: Normal heart sounds.  Pulmonary:     Effort: Pulmonary effort is normal.     Breath sounds: Normal breath sounds.  Abdominal:     General: Abdomen is flat. Bowel sounds are normal.     Palpations: Abdomen is soft.     Tenderness: There is abdominal tenderness in the suprapubic area. There is no right CVA tenderness, left CVA tenderness, guarding or rebound. Negative signs include Murphy's sign and McBurney's sign.  Musculoskeletal:        General: Tenderness (diffuse L ankle pain, trace edema compared to the R ankle. normal ROM. N/V intact.) present. Normal range of motion.     Cervical back: Normal range of motion and neck supple.  Skin:    General: Skin is warm and dry.  Neurological:     General: No focal deficit present.     Mental Status: She is alert and  oriented to person, place, and time.  Psychiatric:        Mood and Affect: Mood normal.        Behavior: Behavior normal.        Thought Content: Thought content normal.        Judgment: Judgment normal.     No results found for this or any previous visit (from the past 24 hours).  Assessment and Plan :   PDMP not reviewed this encounter.  1. Acute vaginitis  2. Acute left ankle pain   3. Essential hypertension     Patient reports recurrent BV and is symptomatic today. Aptima self-swab per patient preference. Will treat pending results (Clindamycin  preferred as she has Flagyl  allergy). Minimal pain noted on exam to left ankle. Possible early gout as she has a history. Treat with prednisone at this time as directed. Pt aware of risks vs benefits and possible adverse reactions. Monitor. Follow up with PCP / sports med if worse or not improving. Didn't feel imaging necessary based on her exam today and no injury. Elevated BP, didn't take her medication. No red flags. No symptoms. Refilled her atenolol  25 mg to get her to next PCP appt. Monitor at home and take all medication as directed.    AllwardtDeleta Felix, PA-C 04/10/24 1342

## 2024-04-10 NOTE — ED Triage Notes (Signed)
 Patient also states she ran out of atenolol  2 days ago.  The soonest she can see pcp is 5/19

## 2024-04-10 NOTE — ED Triage Notes (Signed)
 Vaginal discharge started last night.  Reports lower abdominal pain started today. Patient reports a history of BV and thinks this is the issues.  Denies urinary symptoms.  Requesting to "do a swab"  Patient reports soreness of bilateral ankles, left worse than right ankle

## 2024-04-10 NOTE — Discharge Instructions (Signed)
 Good to meet you today.  I have sent a refill for your atenolol .  Please take your blood pressure medications as prescribed.  Monitor your blood pressure readings at home and be sure to take your log to your primary care provider.  Should you develop any symptoms such as chest pain or shortness of breath, worst headache of life, severe dizziness, weakness on one side, please proceed to the nearest ER.  We will treat your results as they come back if needed.  Take the prednisone as prescribed to help with your ankle pain.  If worse or not improving, follow-up with your PCP or sports medicine. Ice or heat as tolerated and gentle stretches.

## 2024-04-12 ENCOUNTER — Telehealth (HOSPITAL_COMMUNITY): Payer: Self-pay

## 2024-04-12 LAB — CERVICOVAGINAL ANCILLARY ONLY
Bacterial Vaginitis (gardnerella): POSITIVE — AB
Candida Glabrata: NEGATIVE
Candida Vaginitis: POSITIVE — AB
Chlamydia: POSITIVE — AB
Comment: NEGATIVE
Comment: NEGATIVE
Comment: NEGATIVE
Comment: NEGATIVE
Comment: NEGATIVE
Comment: NORMAL
Neisseria Gonorrhea: NEGATIVE
Trichomonas: NEGATIVE

## 2024-04-12 MED ORDER — FLUCONAZOLE 150 MG PO TABS: 150.0000 mg | ORAL_TABLET | Freq: Once | ORAL | 0 refills | Status: AC

## 2024-04-12 MED ORDER — DOXYCYCLINE HYCLATE 100 MG PO TABS: 100.0000 mg | ORAL_TABLET | Freq: Two times a day (BID) | ORAL | 0 refills | Status: AC

## 2024-04-12 MED ORDER — CLINDAMYCIN HCL 150 MG PO CAPS
300.0000 mg | ORAL_CAPSULE | Freq: Two times a day (BID) | ORAL | 0 refills | Status: DC
Start: 2024-04-12 — End: 2024-05-10

## 2024-04-12 NOTE — Telephone Encounter (Signed)
 Per protocol, pt requires tx with Diflucan , Doxycycline , and Clindamycin .  Reviewed with patient, verified pharmacy, prescription sent.

## 2024-04-19 ENCOUNTER — Encounter (HOSPITAL_COMMUNITY): Payer: Self-pay

## 2024-04-26 ENCOUNTER — Inpatient Hospital Stay: Payer: Self-pay | Admitting: Student

## 2024-05-04 ENCOUNTER — Other Ambulatory Visit: Payer: Self-pay | Admitting: Internal Medicine

## 2024-05-04 DIAGNOSIS — I10 Essential (primary) hypertension: Secondary | ICD-10-CM

## 2024-05-04 MED ORDER — ATENOLOL 25 MG PO TABS: 25.0000 mg | ORAL_TABLET | Freq: Every day | ORAL | 2 refills | Status: DC

## 2024-05-04 MED ORDER — AMLODIPINE BESYLATE 10 MG PO TABS: 10.0000 mg | ORAL_TABLET | Freq: Every day | ORAL | 2 refills | Status: DC

## 2024-05-04 NOTE — Telephone Encounter (Signed)
 Copied from CRM [id number]. Topic: Clinical - Medication Refill >> May 04, 2024  9:04 AM Blair Bumpers wrote: Medication: amLODipine  (NORVASC ) 10 MG tablet & atenolol  (TENORMIN ) 25 MG tablet  Has the patient contacted their pharmacy? No (Agent: If no, request that the patient contact the pharmacy for the refill. If patient does not wish to contact the pharmacy document the reason why and proceed with request.) (Agent: If yes, when and what did the pharmacy advise?)  This is the patient's preferred pharmacy:  [id number] Jonette Nestle, [place] - [address] [place] [address] Phone: [phone] Fax: [phone]  [facility] [address] [place] [address] Phone: [phone] Fax: [phone]  Is this the correct pharmacy for this prescription? Yes If no, delete pharmacy and type the correct one.   Has the prescription been filled recently? Yes  Is the patient out of the medication? Yes  Has the patient been seen for an appointment in the last year OR does the patient have an upcoming appointment? Yes  Can we respond through MyChart? Yes  Agent: Please be advised that Rx refills may take up to 3 business days. We ask that you follow-up with your pharmacy.

## 2024-05-04 NOTE — Telephone Encounter (Signed)
 Last Fill: Amlodipine : 02/05/24     Atenolol : 04/10/24  Last OV: 03/03/24 Next OV: 05/10/24  Routing to provider for review/authorization.

## 2024-05-10 ENCOUNTER — Encounter: Payer: Self-pay | Admitting: Internal Medicine

## 2024-05-10 ENCOUNTER — Ambulatory Visit (INDEPENDENT_AMBULATORY_CARE_PROVIDER_SITE_OTHER): Payer: Self-pay | Admitting: Internal Medicine

## 2024-05-10 VITALS — BP 143/90 | HR 66 | Temp 99.1°F | Ht 67.0 in | Wt 146.2 lb

## 2024-05-10 DIAGNOSIS — I10 Essential (primary) hypertension: Secondary | ICD-10-CM | POA: Diagnosis present

## 2024-05-10 DIAGNOSIS — E0500 Thyrotoxicosis with diffuse goiter without thyrotoxic crisis or storm: Secondary | ICD-10-CM

## 2024-05-10 DIAGNOSIS — M25572 Pain in left ankle and joints of left foot: Secondary | ICD-10-CM | POA: Diagnosis not present

## 2024-05-10 DIAGNOSIS — G8929 Other chronic pain: Secondary | ICD-10-CM

## 2024-05-10 MED ORDER — ATENOLOL 25 MG PO TABS: 25.0000 mg | ORAL_TABLET | Freq: Every day | ORAL | 2 refills | Status: AC

## 2024-05-10 MED ORDER — AMLODIPINE BESYLATE 10 MG PO TABS: 10.0000 mg | ORAL_TABLET | Freq: Every day | ORAL | 2 refills | Status: AC

## 2024-05-10 NOTE — Progress Notes (Signed)
 CC: routine  HPI:  Angela Manning is a 38 y.o. female living with a history stated below and presents today for a follow up of her chronic medication conditions. Please see problem based assessment and plan for additional details.  Past Medical History:  Diagnosis Date   AKI (acute kidney injury) (HCC) 03/31/2024   Bacterial vaginosis    Hypertension    Hypertension affecting pregnancy, antepartum 10/27/2019   Ovarian cyst    Preeclampsia 09/10/2023   Pregnancy induced hypertension    Primary hypertension 03/03/2020    Current Outpatient Medications on File Prior to Visit  Medication Sig Dispense Refill   colchicine  0.6 MG tablet Take 1 tablet (0.6 mg total) by mouth 2 (two) times daily. 180 tablet 0   ibuprofen  (ADVIL ) 600 MG tablet Take 1 tablet (600 mg total) by mouth 3 (three) times daily. 30 tablet 0   methimazole  (TAPAZOLE ) 10 MG tablet Take 2 tablets (20 mg total) by mouth 2 (two) times daily. 120 tablet 2   spironolactone  (ALDACTONE ) 25 MG tablet Take 1 tablet (25 mg total) by mouth daily. 90 tablet 3   [DISCONTINUED] norethindrone  (MICRONOR ,CAMILA ,ERRIN ) 0.35 MG tablet Take 1 tablet (0.35 mg total) by mouth daily. (Patient not taking: Reported on 08/23/2019) 1 Package 11   No current facility-administered medications on file prior to visit.    Family History  Problem Relation Age of Onset   Stroke Mother    Hypertension Mother    Valvular heart disease Mother    Healthy Father    Hypertension Maternal Grandmother    Hypertension Paternal Grandmother     Social History   Socioeconomic History   Marital status: Single    Spouse name: Not on file   Number of children: Not on file   Years of education: Not on file   Highest education level: Not on file  Occupational History   Not on file  Tobacco Use   Smoking status: Former    Current packs/day: 0.10    Types: Cigarettes   Smokeless tobacco: Former    Quit date: 01/08/2015   Tobacco comments:     Smokes sometimes.  Vaping Use   Vaping status: Never Used  Substance and Sexual Activity   Alcohol use: Not Currently   Drug use: Not Currently   Sexual activity: Yes    Partners: Male    Birth control/protection: None  Other Topics Concern   Not on file  Social History Narrative   Not on file   Social Drivers of Health   Financial Resource Strain: Medium Risk (05/07/2023)   Overall Financial Resource Strain (CARDIA)    Difficulty of Paying Living Expenses: Somewhat hard  Food Insecurity: No Food Insecurity (03/30/2024)   Hunger Vital Sign    Worried About Running Out of Food in the Last Year: Never true    Ran Out of Food in the Last Year: Never true  Transportation Needs: No Transportation Needs (03/30/2024)   PRAPARE - Administrator, Civil Service (Medical): No    Lack of Transportation (Non-Medical): No  Physical Activity: Inactive (09/10/2023)   Exercise Vital Sign    Days of Exercise per Week: 0 days    Minutes of Exercise per Session: 0 min  Stress: No Stress Concern Present (05/10/2024)   [employer] of Occupational Health - Occupational Stress Questionnaire    Feeling of Stress : Not at all  Social Connections: Moderately Integrated (03/30/2024)   Social Connection and Isolation Panel [NHANES]  Frequency of Communication with Friends and Family: Three times a week    Frequency of Social Gatherings with Friends and Family: Three times a week    Attends Religious Services: 1 to 4 times per year    Active Member of Clubs or Organizations: No    Attends Banker Meetings: Never    Marital Status: Living with partner  Intimate Partner Violence: Unknown (03/30/2024)   Humiliation, Afraid, Rape, and Kick questionnaire    Fear of Current or Ex-Partner: No    Emotionally Abused: No    Physically Abused: Not on file    Sexually Abused: No    Review of Systems: ROS negative except for what is noted on the assessment and plan.  Vitals:    05/10/24 1608 05/10/24 1622 05/10/24 1623  BP: (!) 151/100 (!) 158/90 (!) 143/90  Pulse: 65 66 66  Temp: 99.1 F (37.3 C)    TempSrc: Oral    SpO2: 98%    Weight: 146 lb 3.2 oz (66.3 kg)    Height: 5\' 7"  (1.702 m)      Physical Exam: Constitutional: well-appearing female sitting in chair, in no acute distress Cardiovascular: regular rate and rhythm, no m/r/g Pulmonary/Chest: normal work of breathing on room air MSK: normal bulk and tone, no ankle swelling or tenderness. Normal ROM and no pain with passive inversion/eversion of ankle.  Neurological: alert & oriented x 3, no focal deficit Skin: warm and dry Psych: normal mood and behavior  Assessment & Plan:   Patient discussed with Dr. Broadus Canes  Primary hypertension Blood pressure is not abated to 151/100 and 143/90 upon recheck.  The patient takes atenolol  25 mg daily, spironolactone  25 mg daily, and amlodipine  10 mg daily.  She has only been taking her atenolol  and spironolactone , as she has been out of her amlodipine .  Additionally, the patient used to take lisinopril , but this medication was discontinued when the patient experienced facial swelling (facial swelling could have been secondary to preseptal cellulitis, but it was discontinued due to concern for angioedema).  Plan: - Continue atenolol  and spironolactone  - Restart amlodipine  10 mg daily - If blood pressure is elevated at follow-up in 4 weeks, would start ARB  Thyrotoxicosis due to Graves' disease The patient follows up with endocrinology and has been taking methimazole  20 mg twice daily and atenolol  25 mg daily.  No acute concerns at this time and the patient feels significantly improved compared to her hospitalization in March.  Left ankle pain The patient is experiencing left ankle pain around her lateral malleolus.  She was treated empirically for a gout flare with prednisone  about 1 month ago and states that she had an improvement in her symptoms, but her pain  did not completely resolve.  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  She currently wears slip on shoes throughout the day, noting that they do not have much support.  At the end of the day, she has been soaking her feet with Epsom salts and notes that this has helped her pain.  She denies any recent injuries and states that she is still able to bear weight on her foot and walk without any issues. On exam, the patient has no erythema or swelling of her ankle, she has no pain with passive inversion/eversion of the ankle, and has normal range of motion. She has no bony tenderness around her lateral  or medial malleolus and her gait is normal. I advised her to continue with supportive care, and to get sneakers with good support to wear while she is at work and at school. No imaging indicated at this time.    Nashae Maudlin, D.O. [facility], PGY-3 Phone: [phone] Date 05/10/2024 Time 4:44 PM

## 2024-05-10 NOTE — Assessment & Plan Note (Addendum)
 Blood pressure is not abated to 151/100 and 143/90 upon recheck.  The patient takes atenolol  25 mg daily, spironolactone  25 mg daily, and amlodipine  10 mg daily.  She has only been taking her atenolol  and spironolactone , as she has been out of her amlodipine .  Additionally, the patient used to take lisinopril , but this medication was discontinued when the patient experienced facial swelling (facial swelling could have been secondary to preseptal cellulitis, but it was discontinued due to concern for angioedema).  Plan: - Continue atenolol  and spironolactone  - Restart amlodipine  10 mg daily - If blood pressure is elevated at follow-up in 4 weeks, would start ARB

## 2024-05-10 NOTE — Patient Instructions (Addendum)
 Thank you, Ms.Myrtice Athens for allowing us  to provide your care today. Today we discussed:  High blood pressure Keep taking spironolactone  and atenolol  once a day. Restart amlodipine  and take this everyday, as well.  If your blood pressure stays high after you restart the amlodipine , we will have to start an additional medication  Keep taking methimazole  20 mg twice a day for your thyroid   Your left ankle does not look like you are having a gout flare. Continue to soak your ankle with epsom salts since that is helping and be sure to get sneakers with good support, since you are on your feet all day!  Good luck with the summer semester!  I have ordered the following labs for you:  Lab Orders  No laboratory test(s) ordered today      Referrals ordered today:   Referral Orders  No referral(s) requested today     I have ordered the following medication/changed the following medications:   Stop the following medications: Medications Discontinued During This Encounter  Medication Reason   amoxicillin -clavulanate (AUGMENTIN ) 875-125 MG tablet    pantoprazole  (PROTONIX ) 40 MG tablet    sucralfate  (CARAFATE ) 1 g tablet    clindamycin  (CLEOCIN ) 150 MG capsule      Start the following medications: No orders of the defined types were placed in this encounter.    Follow up: 1 month for hypertension    Should you have any questions or concerns please call the internal medicine clinic at [phone].     Decklan Mau, D.O. [facility]

## 2024-05-10 NOTE — Assessment & Plan Note (Addendum)
 The patient follows up with endocrinology and has been taking methimazole  20 mg twice daily and atenolol  25 mg daily.  No acute concerns at this time and the patient feels significantly improved compared to her hospitalization in March.

## 2024-05-10 NOTE — Assessment & Plan Note (Addendum)
 The patient is experiencing left ankle pain around her lateral malleolus.  She was treated empirically for a gout flare with prednisone  about 1 month ago and states that she had an improvement in her symptoms, but her pain did not completely resolve.  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  She currently wears slip on shoes throughout the day, noting that they do not have much support.  At the end of the day, she has been soaking her feet with Epsom salts and notes that this has helped her pain.  She denies any recent injuries and states that she is still able to bear weight on her foot and walk without any issues. On exam, the patient has no erythema or swelling of her ankle, she has no pain with passive inversion/eversion of the ankle, and has normal range of motion. She has no bony tenderness around her lateral or medial malleolus and her gait is normal. I advised her to continue with supportive care, and to get sneakers with good support to wear while she is at work and at school. No imaging indicated at this time.

## 2024-05-11 NOTE — Progress Notes (Signed)
 Internal Medicine Clinic Attending  Case discussed with the resident at the time of the visit.  We reviewed the resident's history and exam and pertinent patient test results.  I agree with the assessment, diagnosis, and plan of care documented in the resident's note.

## 2024-05-18 ENCOUNTER — Ambulatory Visit (HOSPITAL_COMMUNITY)

## 2024-05-24 ENCOUNTER — Ambulatory Visit (HOSPITAL_COMMUNITY)
Admission: RE | Admit: 2024-05-24 | Discharge: 2024-05-24 | Disposition: A | Discharge status: Home / Self Care | Source: Ambulatory Visit | Attending: Emergency Medicine | Admitting: Emergency Medicine

## 2024-05-24 ENCOUNTER — Encounter (HOSPITAL_COMMUNITY): Payer: Self-pay

## 2024-05-24 VITALS — BP 153/97 | HR 64 | Temp 97.9°F | Resp 16

## 2024-05-24 DIAGNOSIS — N898 Other specified noninflammatory disorders of vagina: Secondary | ICD-10-CM | POA: Insufficient documentation

## 2024-05-24 DIAGNOSIS — N760 Acute vaginitis: Secondary | ICD-10-CM | POA: Insufficient documentation

## 2024-05-24 MED ORDER — CLINDAMYCIN HCL 300 MG PO CAPS: 300.0000 mg | ORAL_CAPSULE | Freq: Two times a day (BID) | ORAL | 0 refills | Status: AC

## 2024-05-24 NOTE — ED Provider Notes (Signed)
 [facility]    CSN: [id number] Arrival date & time: 05/24/24  1726      History   Chief Complaint Chief Complaint  Patient presents with   appt 530p    HPI Angela Manning is a 38 y.o. female.   Patient presents with malodorous vaginal discharge for 3 to 4 days.  Patient reports history of recurrent bacterial vaginosis with her menstrual cycles.  Patient reports that she just finished her menstrual cycle today.  Patient reports that she is sexually active.  Denies any known exposures to STDs.  Denies abnormal vaginal discharge, vaginal pain, abdominal pain, flank pain, fever, dysuria, urinary frequency/urgency, and hematuria.  Patient is allergic to metronidazole  and states that she normally takes clindamycin  with relief of bacterial vaginosis.   The history is provided by the patient and medical records.    Past Medical History:  Diagnosis Date   AKI (acute kidney injury) (HCC) 03/31/2024   Bacterial vaginosis    Hypertension    Hypertension affecting pregnancy, antepartum 10/27/2019   Ovarian cyst    Preeclampsia 09/10/2023   Pregnancy induced hypertension    Primary hypertension 03/03/2020    Patient Active Problem List   Diagnosis Date Noted   Left ankle pain 05/10/2024   Hyperkalemia 03/31/2024   Altered mental status 03/30/2024   Acute pericarditis 02/29/2024   Resistant hypertension 02/27/2024   Chest pain 02/27/2024   Thyrotoxicosis due to Graves' disease 02/27/2024   Weakness of both lower extremities 02/09/2024   Thyroid  enlargement 02/09/2024   Abnormal Pap smear of cervix 10/23/2021   Generalized anxiety disorder 04/19/2021   Primary hypertension 03/03/2020   Ovarian cyst, left 03/01/2015   Menorrhagia with irregular cycle 03/01/2015   Dyspareunia 03/01/2015    Past Surgical History:  Procedure Laterality Date   DILATION AND EVACUATION N/A 02/21/2013   Procedure: DILATATION AND EVACUATION;  Surgeon: Granville Layer, MD;  Location: [facility];  Service: Gynecology;  Laterality: N/A;    OB History     Gravida  6   Para  4   Term  3   Preterm  1   AB      Living  4      SAB      IAB      Ectopic      Multiple      Live Births  4            Home Medications    Prior to Admission medications   Medication Sig Start Date End Date Taking? Authorizing Provider  clindamycin  (CLEOCIN ) 300 MG capsule Take 1 capsule (300 mg total) by mouth 2 (two) times daily for 7 days. 05/24/24 05/31/24 Yes Aeneas Longsworth A, NP  amLODipine  (NORVASC ) 10 MG tablet Take 1 tablet (10 mg total) by mouth daily. 05/10/24   Atway, Rayann N, DO  atenolol  (TENORMIN ) 25 MG tablet Take 1 tablet (25 mg total) by mouth daily. 05/10/24   Atway, Rayann N, DO  colchicine  0.6 MG tablet Take 1 tablet (0.6 mg total) by mouth 2 (two) times daily. 02/29/24   McLendon, Michael, MD  ibuprofen  (ADVIL ) 600 MG tablet Take 1 tablet (600 mg total) by mouth 3 (three) times daily. 02/29/24   Adria Hopkins, MD  methimazole  (TAPAZOLE ) 10 MG tablet Take 2 tablets (20 mg total) by mouth 2 (two) times daily. 02/29/24   Adria Hopkins, MD  spironolactone  (ALDACTONE ) 25 MG tablet Take 1 tablet (25 mg total) by mouth daily. 02/05/24  Cleven Dallas, DO  norethindrone  (MICRONOR ,CAMILA ,ERRIN ) 0.35 MG tablet Take 1 tablet (0.35 mg total) by mouth daily. Patient not taking: Reported on 08/23/2019 10/07/18 09/29/19  Jan Mcgill, MD    Family History Family History  Problem Relation Age of Onset   Stroke Mother    Hypertension Mother    Valvular heart disease Mother    Healthy Father    Hypertension Maternal Grandmother    Hypertension Paternal Grandmother     Social History Social History   Tobacco Use   Smoking status: Former    Current packs/day: 0.10    Types: Cigarettes   Smokeless tobacco: Former    Quit date: 01/08/2015   Tobacco comments:    Smokes sometimes.  Vaping Use   Vaping status: Never Used  Substance Use Topics   Alcohol use:  Not Currently   Drug use: Not Currently     Allergies   Ace inhibitors and Flagyl  [metronidazole ]   Review of Systems Review of Systems  Per HPI  Physical Exam Triage Vital Signs ED Triage Vitals  Encounter Vitals Group     BP 05/24/24 1739 (!) 153/97     Girls Systolic BP Percentile --      Girls Diastolic BP Percentile --      Boys Systolic BP Percentile --      Boys Diastolic BP Percentile --      Pulse Rate 05/24/24 1739 64     Resp 05/24/24 1739 16     Temp 05/24/24 1739 97.9 F (36.6 C)     Temp Source 05/24/24 1739 Oral     SpO2 05/24/24 1739 98 %     Weight --      Height --      Head Circumference --      Peak Flow --      Pain Score 05/24/24 1738 0     Pain Loc --      Pain Education --      Exclude from Growth Chart --    No data found.  Updated Vital Signs BP (!) 153/97 (BP Location: Right Arm)   Pulse 64   Temp 97.9 F (36.6 C) (Oral)   Resp 16   LMP 05/19/2024 (Exact Date)   SpO2 98%   Visual Acuity Right Eye Distance:   Left Eye Distance:   Bilateral Distance:    Right Eye Near:   Left Eye Near:    Bilateral Near:     Physical Exam Vitals and nursing note reviewed.  Constitutional:      General: She is awake. She is not in acute distress.    Appearance: Normal appearance. She is well-developed and well-groomed. She is not ill-appearing.  Genitourinary:    Comments: Exam deferred  Skin:    General: Skin is warm and dry.   Neurological:     Mental Status: She is alert.   Psychiatric:        Behavior: Behavior is cooperative.      UC Treatments / Results  Labs (all labs ordered are listed, but only abnormal results are displayed) Labs Reviewed  CERVICOVAGINAL ANCILLARY ONLY    EKG   Radiology No results found.  Procedures Procedures (including critical care time)  Medications Ordered in UC Medications - No data to display  Initial Impression / Assessment and Plan / UC Course  I have reviewed the triage vital  signs and the nursing notes.  Pertinent labs & imaging results that were available during my care  of the patient were reviewed by me and considered in my medical decision making (see chart for details).     Patient is well-appearing.  Vitals are stable.  GU exam deferred.  Patient perform self swab for STD/STI.  HIV and RPR declined.  Prescribed clindamycin  for empiric treatment of bacterial vaginosis.  Discussed follow-up and return precautions. Final Clinical Impressions(s) / UC Diagnoses   Final diagnoses:  Vaginal discharge  Acute vaginitis     Discharge Instructions      Start taking clindamycin  twice daily for 7 days for bacterial vaginosis coverage. Your results will come back over the next few days and someone will call if results are positive and require any additional treatment or adjustment to treatment plan.  Return here as needed.     ED Prescriptions     Medication Sig Dispense Auth. Provider   clindamycin  (CLEOCIN ) 300 MG capsule Take 1 capsule (300 mg total) by mouth 2 (two) times daily for 7 days. 14 capsule Levora Reas A, NP      PDMP not reviewed this encounter.   Levora Reas A, NP 05/24/24 1754

## 2024-05-24 NOTE — Discharge Instructions (Signed)
 Start taking clindamycin  twice daily for 7 days for bacterial vaginosis coverage. Your results will come back over the next few days and someone will call if results are positive and require any additional treatment or adjustment to treatment plan.  Return here as needed.

## 2024-05-24 NOTE — ED Triage Notes (Signed)
 Pt reports hx BV. Reports had the odor for several days. Just finished her menstrual.

## 2024-05-25 ENCOUNTER — Ambulatory Visit (HOSPITAL_COMMUNITY): Payer: Self-pay

## 2024-05-25 LAB — CERVICOVAGINAL ANCILLARY ONLY
Bacterial Vaginitis (gardnerella): POSITIVE — AB
Candida Glabrata: NEGATIVE
Candida Vaginitis: NEGATIVE
Chlamydia: NEGATIVE
Comment: NEGATIVE
Comment: NEGATIVE
Comment: NEGATIVE
Comment: NEGATIVE
Comment: NEGATIVE
Comment: NORMAL
Neisseria Gonorrhea: NEGATIVE
Trichomonas: POSITIVE — AB

## 2024-05-25 MED ORDER — TINIDAZOLE 500 MG PO TABS: 500.0000 mg | ORAL_TABLET | Freq: Two times a day (BID) | ORAL | 0 refills | Status: AC

## 2024-05-31 ENCOUNTER — Encounter: Payer: Self-pay | Admitting: Diagnostic Neuroimaging

## 2024-05-31 ENCOUNTER — Ambulatory Visit: Admitting: Diagnostic Neuroimaging

## 2024-06-02 ENCOUNTER — Other Ambulatory Visit: Payer: Self-pay | Admitting: Student

## 2024-06-10 ENCOUNTER — Encounter (HOSPITAL_COMMUNITY): Payer: Self-pay

## 2024-06-10 ENCOUNTER — Ambulatory Visit (HOSPITAL_COMMUNITY)
Admission: RE | Admit: 2024-06-10 | Discharge: 2024-06-10 | Disposition: A | Discharge status: Home / Self Care | Source: Ambulatory Visit | Attending: Internal Medicine | Admitting: Internal Medicine

## 2024-06-10 VITALS — BP 165/100 | HR 65 | Temp 98.7°F | Resp 16

## 2024-06-10 DIAGNOSIS — R102 Pelvic and perineal pain: Secondary | ICD-10-CM | POA: Insufficient documentation

## 2024-06-10 DIAGNOSIS — Z113 Encounter for screening for infections with a predominantly sexual mode of transmission: Secondary | ICD-10-CM | POA: Diagnosis not present

## 2024-06-10 LAB — POCT URINALYSIS DIP (MANUAL ENTRY)
Bilirubin, UA: NEGATIVE
Blood, UA: NEGATIVE
Glucose, UA: NEGATIVE mg/dL
Ketones, POC UA: NEGATIVE mg/dL
Leukocytes, UA: NEGATIVE
Nitrite, UA: NEGATIVE
Protein Ur, POC: NEGATIVE mg/dL
Spec Grav, UA: 1.015 (ref 1.010–1.025)
Urobilinogen, UA: 0.2 U/dL
pH, UA: 6 (ref 5.0–8.0)

## 2024-06-10 NOTE — Discharge Instructions (Addendum)
 Urinalysis done today. This negative. Screening swab done today and results will be available in 24-48 hours. We will contact you if we need to arrange additional treatment based on your testing. Negative results will be on your MyChart account. Use a condom for sexual encounters. If you have any worsening or changing symptoms including abnormal discharge, pelvic pain, abdominal pain, fever, nausea, or vomiting, then you should be reevaluated.

## 2024-06-10 NOTE — ED Provider Notes (Signed)
 [facility]    CSN: [id number] Arrival date & time: 06/10/24  1127      History   Chief Complaint Chief Complaint  Patient presents with   SEXUALLY TRANSMITTED DISEASE    HPI Angela Manning is a 38 y.o. female.   38 y.o. female who presents to urgent care with complaints of persistent symptoms after being treated for bacterial vaginitis and trichomonas.  She reports that the only symptom she has now is some mild lower abdominal/suprapubic pressure.  She denies vaginal discharge, dysuria, hematuria, fevers, chills, diarrhea, vaginal pain.  There is still a little vaginal irritation.     Past Medical History:  Diagnosis Date   AKI (acute kidney injury) (HCC) 03/31/2024   Bacterial vaginosis    Hypertension    Hypertension affecting pregnancy, antepartum 10/27/2019   Ovarian cyst    Preeclampsia 09/10/2023   Pregnancy induced hypertension    Primary hypertension 03/03/2020    Patient Active Problem List   Diagnosis Date Noted   Left ankle pain 05/10/2024   Hyperkalemia 03/31/2024   Altered mental status 03/30/2024   Acute pericarditis 02/29/2024   Resistant hypertension 02/27/2024   Chest pain 02/27/2024   Thyrotoxicosis due to Graves' disease 02/27/2024   Weakness of both lower extremities 02/09/2024   Thyroid  enlargement 02/09/2024   Abnormal Pap smear of cervix 10/23/2021   Generalized anxiety disorder 04/19/2021   Primary hypertension 03/03/2020   Ovarian cyst, left 03/01/2015   Menorrhagia with irregular cycle 03/01/2015   Dyspareunia 03/01/2015    Past Surgical History:  Procedure Laterality Date   DILATION AND EVACUATION N/A 02/21/2013   Procedure: DILATATION AND EVACUATION;  Surgeon: Glenys GORMAN Birk, MD;  Location: [facility];  Service: Gynecology;  Laterality: N/A;    OB History     Gravida  6   Para  4   Term  3   Preterm  1   AB      Living  4      SAB      IAB      Ectopic      Multiple      Live Births  4             Home Medications    Prior to Admission medications   Medication Sig Start Date End Date Taking? Authorizing Provider  amLODipine  (NORVASC ) 10 MG tablet Take 1 tablet (10 mg total) by mouth daily. 05/10/24   Atway, Rayann N, DO  atenolol  (TENORMIN ) 25 MG tablet Take 1 tablet (25 mg total) by mouth daily. 05/10/24   Atway, Rayann N, DO  colchicine  0.6 MG tablet Take 1 tablet (0.6 mg total) by mouth 2 (two) times daily. 02/29/24   Norrine Sharper, MD  ibuprofen  (ADVIL ) 600 MG tablet Take 1 tablet (600 mg total) by mouth 3 (three) times daily. 02/29/24   Norrine Sharper, MD  methimazole  (TAPAZOLE ) 10 MG tablet TAKE 2 TABLETS BY MOUTH 2 TIMES DAILY. 06/02/24   Zheng, Michael, DO  spironolactone  (ALDACTONE ) 25 MG tablet Take 1 tablet (25 mg total) by mouth daily. 02/05/24   Jolaine Pac, DO  norethindrone  (MICRONOR ,CAMILA ,ERRIN ) 0.35 MG tablet Take 1 tablet (0.35 mg total) by mouth daily. Patient not taking: Reported on 08/23/2019 10/07/18 09/29/19  Nicholaus Burnard HERO, MD    Family History Family History  Problem Relation Age of Onset   Stroke Mother    Hypertension Mother    Valvular heart disease Mother    Healthy Father  Hypertension Maternal Grandmother    Hypertension Paternal Grandmother     Social History Social History   Tobacco Use   Smoking status: Former    Current packs/day: 0.10    Types: Cigarettes   Smokeless tobacco: Former    Quit date: 01/08/2015   Tobacco comments:    Smokes sometimes.  Vaping Use   Vaping status: Never Used  Substance Use Topics   Alcohol use: Not Currently   Drug use: Not Currently     Allergies   Ace inhibitors and Flagyl  [metronidazole ]   Review of Systems Review of Systems  Constitutional:  Negative for chills and fever.  HENT:  Negative for ear pain and sore throat.   Eyes:  Negative for pain and visual disturbance.  Respiratory:  Negative for cough and shortness of breath.   Cardiovascular:  Negative for chest pain and  palpitations.  Gastrointestinal:  Negative for abdominal pain and vomiting.  Genitourinary:  Positive for pelvic pain. Negative for dysuria and hematuria.       Mild irritation vaginally  Musculoskeletal:  Negative for arthralgias and back pain.  Skin:  Negative for color change and rash.  Neurological:  Negative for seizures and syncope.  All other systems reviewed and are negative.    Physical Exam Triage Vital Signs ED Triage Vitals  Encounter Vitals Group     BP 06/10/24 1152 (!) 165/100     Girls Systolic BP Percentile --      Girls Diastolic BP Percentile --      Boys Systolic BP Percentile --      Boys Diastolic BP Percentile --      Pulse Rate 06/10/24 1152 65     Resp 06/10/24 1152 16     Temp 06/10/24 1152 98.7 F (37.1 C)     Temp Source 06/10/24 1152 Oral     SpO2 06/10/24 1152 100 %     Weight --      Height --      Head Circumference --      Peak Flow --      Pain Score 06/10/24 1153 0     Pain Loc --      Pain Education --      Exclude from Growth Chart --    No data found.  Updated Vital Signs BP (!) 165/100 (BP Location: Right Arm)   Pulse 65   Temp 98.7 F (37.1 C) (Oral)   Resp 16   LMP 05/19/2024 (Exact Date)   SpO2 100%   Visual Acuity Right Eye Distance:   Left Eye Distance:   Bilateral Distance:    Right Eye Near:   Left Eye Near:    Bilateral Near:     Physical Exam Vitals and nursing note reviewed.  Constitutional:      General: She is not in acute distress.    Appearance: She is well-developed.  HENT:     Head: Normocephalic and atraumatic.  Eyes:     Conjunctiva/sclera: Conjunctivae normal.  Cardiovascular:     Rate and Rhythm: Normal rate and regular rhythm.     Heart sounds: No murmur heard. Pulmonary:     Effort: Pulmonary effort is normal. No respiratory distress.     Breath sounds: Normal breath sounds.  Abdominal:     Palpations: Abdomen is soft.     Tenderness: There is no abdominal tenderness.   Musculoskeletal:        General: No swelling.     Cervical back: Neck  supple.  Skin:    General: Skin is warm and dry.     Capillary Refill: Capillary refill takes less than 2 seconds.  Neurological:     Mental Status: She is alert.  Psychiatric:        Mood and Affect: Mood normal.      UC Treatments / Results  Labs (all labs ordered are listed, but only abnormal results are displayed) Labs Reviewed  POCT URINALYSIS DIP (MANUAL ENTRY)  CERVICOVAGINAL ANCILLARY ONLY    EKG   Radiology No results found.  Procedures Procedures (including critical care time)  Medications Ordered in UC Medications - No data to display  Initial Impression / Assessment and Plan / UC Course  I have reviewed the triage vital signs and the nursing notes.  Pertinent labs & imaging results that were available during my care of the patient were reviewed by me and considered in my medical decision making (see chart for details).     Screening examination for STI  Suprapubic pain   Urinalysis done today due to the pressure and suprapubic pressure. This negative. Screening swab done today and results will be available in 24-48 hours. We will contact you if we need to arrange additional treatment based on your testing. Negative results will be on your MyChart account. Use a condom for sexual encounters. If you have any worsening or changing symptoms including abnormal discharge, pelvic pain, abdominal pain, fever, nausea, or vomiting, then you should be reevaluated.     Final Clinical Impressions(s) / UC Diagnoses   Final diagnoses:  Screening examination for STI  Suprapubic pain     Discharge Instructions      Urinalysis done today. This negative. Screening swab done today and results will be available in 24-48 hours. We will contact you if we need to arrange additional treatment based on your testing. Negative results will be on your MyChart account. Use a condom for sexual encounters.  If you have any worsening or changing symptoms including abnormal discharge, pelvic pain, abdominal pain, fever, nausea, or vomiting, then you should be reevaluated.       ED Prescriptions   None    PDMP not reviewed this encounter.   Teresa Almarie LABOR, PA-C 06/10/24 1255

## 2024-06-10 NOTE — ED Triage Notes (Signed)
 Pt states was treat for BV and Trich a few weeks ago and still having discomfort. Requesting retesting. Denies discharge.

## 2024-06-14 LAB — CERVICOVAGINAL ANCILLARY ONLY
Bacterial Vaginitis (gardnerella): NEGATIVE
Chlamydia: NEGATIVE
Comment: NEGATIVE
Comment: NEGATIVE
Comment: NEGATIVE
Comment: NORMAL
Neisseria Gonorrhea: NEGATIVE
Trichomonas: POSITIVE — AB

## 2024-06-15 ENCOUNTER — Ambulatory Visit (HOSPITAL_COMMUNITY): Payer: Self-pay

## 2024-06-15 MED ORDER — METRONIDAZOLE 500 MG PO TABS: 500.0000 mg | ORAL_TABLET | Freq: Two times a day (BID) | ORAL | 0 refills | Status: AC

## 2024-06-16 ENCOUNTER — Telehealth (HOSPITAL_COMMUNITY): Payer: Self-pay

## 2024-06-16 MED ORDER — PREDNISONE 20 MG PO TABS: 40.0000 mg | ORAL_TABLET | Freq: Every day | ORAL | 0 refills | Status: AC

## 2024-06-16 NOTE — Telephone Encounter (Signed)
 No clinical indication for re-treatment for trichomonas as she is no longer experiencing vaginitis symptoms.  Allergic reaction to flagyl  will be treated with prednisone  40mg  once daily for the next 5 days.  No signs of anaphylaxis with telephone assessment by CMA.  ER and urgent care return precautions discussed by CMA.

## 2024-06-16 NOTE — Addendum Note (Signed)
 Addended by: Terik Haughey M on: 06/16/2024 12:00 PM   Modules accepted: Orders

## 2024-06-16 NOTE — Telephone Encounter (Signed)
 Patient arrived at clinic today upset about an allergic reaction she is having due to the wrong medication being prescribe on 06/15/2024. Patient states she has an allergy to Flagyl  and this medication was sent in yesterday. Patient states she took the medication yesterday and did not realize this was the same medication she has an allergies to. Patient states we are the health care professional and we should be sued for this.  I apologize to the patient and offered to check her in so that we can access her allergic reaction in the clinic at this time. Patient refuses and left angry. Discuss with provider on the next steps to take regarding patient care.Advised to give patient experience number and we would send in a steroid to help with allergic reaction.   Called patient explain to her Dominga does not need to be retreated as it was a couple days to early and the infection should be clear at her next testing. Advised patient we would be sending  in a new prescription to help with allergic blister on her hand and vagina area.  Patient stated okay and hung up the phone.

## 2024-07-01 ENCOUNTER — Ambulatory Visit (HOSPITAL_COMMUNITY)

## 2024-07-02 ENCOUNTER — Encounter (HOSPITAL_COMMUNITY): Payer: Self-pay

## 2024-07-02 ENCOUNTER — Ambulatory Visit (HOSPITAL_COMMUNITY)
Admission: RE | Admit: 2024-07-02 | Discharge: 2024-07-02 | Disposition: A | Discharge status: Home / Self Care | Source: Ambulatory Visit | Attending: Physician Assistant | Admitting: Physician Assistant

## 2024-07-02 VITALS — BP 139/85 | HR 57 | Temp 98.1°F | Resp 18

## 2024-07-02 DIAGNOSIS — N898 Other specified noninflammatory disorders of vagina: Secondary | ICD-10-CM | POA: Insufficient documentation

## 2024-07-02 MED ORDER — CLINDAMYCIN HCL 300 MG PO CAPS: 300.0000 mg | ORAL_CAPSULE | Freq: Three times a day (TID) | ORAL | 0 refills | Status: DC

## 2024-07-02 NOTE — Discharge Instructions (Addendum)
 We are treating you for bacterial vaginosis.  Take clindamycin  3 times daily for 1 week.  Take this with food as it can upset your stomach.  We will contact you if we need to arrange any additional treatment.  If you develop any pelvic pain, abdominal pain, fever, nausea, vomiting you need to be seen immediately.  As we discussed, you can use boric acid suppositories into the vagina over-the-counter to help decrease the frequency of BV.  You could also have your partner treated.  I recommend following up with your OB/GYN if symptoms recur to investigate possible treatment options.

## 2024-07-02 NOTE — ED Provider Notes (Signed)
 [facility]    CSN: [id number] Arrival date & time: 07/02/24  9068      History   Chief Complaint Chief Complaint  Patient presents with   Abdominal Pain    Entered by patient   Vaginal Discharge    HPI Angela Manning is a 38 y.o. female.   Patient presents today with a several day history of increased vaginal discharge that is malodorous.  She has a history of recurrent bacterial vaginosis with similar presentation.  She denies any additional symptoms including genital lesion, pelvic pain, abdominal pain, fever, nausea, vomiting.  She reports recently having testing for HIV and syphilis and so declined repeat testing today.  She is confident that she is not pregnant as she just had her menstrual cycle.  She denies any recent antibiotics.  Does not take SGLT2 inhibitor.  She has an allergy listed to metronidazole  in her chart but was treated earlier this month for trichomonas.  She reports getting blisters with this medication and so request that we not prescribe this class of medicine again.    Past Medical History:  Diagnosis Date   AKI (acute kidney injury) (HCC) 03/31/2024   Bacterial vaginosis    Hypertension    Hypertension affecting pregnancy, antepartum 10/27/2019   Ovarian cyst    Preeclampsia 09/10/2023   Pregnancy induced hypertension    Primary hypertension 03/03/2020    Patient Active Problem List   Diagnosis Date Noted   Left ankle pain 05/10/2024   Hyperkalemia 03/31/2024   Altered mental status 03/30/2024   Acute pericarditis 02/29/2024   Resistant hypertension 02/27/2024   Chest pain 02/27/2024   Thyrotoxicosis due to Graves' disease 02/27/2024   Weakness of both lower extremities 02/09/2024   Thyroid  enlargement 02/09/2024   Abnormal Pap smear of cervix 10/23/2021   Generalized anxiety disorder 04/19/2021   Primary hypertension 03/03/2020   Ovarian cyst, left 03/01/2015   Menorrhagia with irregular cycle 03/01/2015   Dyspareunia  03/01/2015    Past Surgical History:  Procedure Laterality Date   DILATION AND EVACUATION N/A 02/21/2013   Procedure: DILATATION AND EVACUATION;  Surgeon: Glenys GORMAN Birk, MD;  Location: [facility];  Service: Gynecology;  Laterality: N/A;    OB History     Gravida  6   Para  4   Term  3   Preterm  1   AB      Living  4      SAB      IAB      Ectopic      Multiple      Live Births  4            Home Medications    Prior to Admission medications   Medication Sig Start Date End Date Taking? Authorizing Provider  amLODipine  (NORVASC ) 10 MG tablet Take 1 tablet (10 mg total) by mouth daily. 05/10/24  Yes Atway, Rayann N, DO  atenolol  (TENORMIN ) 25 MG tablet Take 1 tablet (25 mg total) by mouth daily. 05/10/24  Yes Atway, Rayann N, DO  clindamycin  (CLEOCIN ) 300 MG capsule Take 1 capsule (300 mg total) by mouth 3 (three) times daily. 07/02/24  Yes Kensy Blizard K, PA-C  methimazole  (TAPAZOLE ) 10 MG tablet TAKE 2 TABLETS BY MOUTH 2 TIMES DAILY. 06/02/24  Yes Zheng, Michael, DO  spironolactone  (ALDACTONE ) 25 MG tablet Take 1 tablet (25 mg total) by mouth daily. 02/05/24  Yes Jolaine Pac, DO  colchicine  0.6 MG tablet Take 1 tablet (0.6 mg  total) by mouth 2 (two) times daily. 02/29/24   Norrine Sharper, MD  ibuprofen  (ADVIL ) 600 MG tablet Take 1 tablet (600 mg total) by mouth 3 (three) times daily. 02/29/24   Norrine Sharper, MD  norethindrone  (MICRONOR ,CAMILA ,ERRIN ) 0.35 MG tablet Take 1 tablet (0.35 mg total) by mouth daily. Patient not taking: Reported on 08/23/2019 10/07/18 09/29/19  Nicholaus Burnard HERO, MD    Family History Family History  Problem Relation Age of Onset   Stroke Mother    Hypertension Mother    Valvular heart disease Mother    Healthy Father    Hypertension Maternal Grandmother    Hypertension Paternal Grandmother     Social History Social History   Tobacco Use   Smoking status: Former    Current packs/day: 0.10    Types: Cigarettes   Smokeless  tobacco: Former    Quit date: 01/08/2015   Tobacco comments:    Smokes sometimes.  Vaping Use   Vaping status: Never Used  Substance Use Topics   Alcohol use: Not Currently   Drug use: Not Currently     Allergies   Ace inhibitors and Flagyl  [metronidazole ]   Review of Systems Review of Systems  Constitutional:  Negative for activity change, appetite change, fatigue and fever.  Gastrointestinal:  Negative for abdominal pain, diarrhea, nausea and vomiting.  Genitourinary:  Positive for vaginal discharge. Negative for dysuria, frequency, genital sores, urgency, vaginal bleeding and vaginal pain.     Physical Exam Triage Vital Signs ED Triage Vitals  Encounter Vitals Group     BP 07/02/24 0943 139/85     Girls Systolic BP Percentile --      Girls Diastolic BP Percentile --      Boys Systolic BP Percentile --      Boys Diastolic BP Percentile --      Pulse Rate 07/02/24 0943 (!) 57     Resp 07/02/24 0943 18     Temp 07/02/24 0943 98.1 F (36.7 C)     Temp Source 07/02/24 0943 Oral     SpO2 07/02/24 0943 99 %     Weight --      Height --      Head Circumference --      Peak Flow --      Pain Score 07/02/24 0949 0     Pain Loc --      Pain Education --      Exclude from Growth Chart --    No data found.  Updated Vital Signs BP 139/85 (BP Location: Left Arm)   Pulse (!) 57   Temp 98.1 F (36.7 C) (Oral)   Resp 18   LMP 06/12/2024 (Approximate)   SpO2 99%   Visual Acuity Right Eye Distance:   Left Eye Distance:   Bilateral Distance:    Right Eye Near:   Left Eye Near:    Bilateral Near:     Physical Exam Vitals reviewed.  Constitutional:      General: She is awake. She is not in acute distress.    Appearance: Normal appearance. She is well-developed. She is not ill-appearing.     Comments: Very pleasant female appears stated age in no acute distress sitting comfortably in exam room  HENT:     Head: Normocephalic and atraumatic.  Cardiovascular:      Rate and Rhythm: Normal rate and regular rhythm.     Heart sounds: Normal heart sounds, S1 normal and S2 normal. No murmur heard. Pulmonary:  Effort: Pulmonary effort is normal.     Breath sounds: Normal breath sounds. No wheezing, rhonchi or rales.     Comments: Clear to auscultation bilaterally Abdominal:     Palpations: Abdomen is soft.     Tenderness: There is no abdominal tenderness. There is no right CVA tenderness, left CVA tenderness, guarding or rebound.  Genitourinary:    Comments: Exam deferred Psychiatric:        Behavior: Behavior is cooperative.      UC Treatments / Results  Labs (all labs ordered are listed, but only abnormal results are displayed) Labs Reviewed  CERVICOVAGINAL ANCILLARY ONLY    EKG   Radiology No results found.  Procedures Procedures (including critical care time)  Medications Ordered in UC Medications - No data to display  Initial Impression / Assessment and Plan / UC Course  I have reviewed the triage vital signs and the nursing notes.  Pertinent labs & imaging results that were available during my care of the patient were reviewed by me and considered in my medical decision making (see chart for details).     Patient is well-appearing, afebrile, nontoxic, nontachycardic.  Symptoms are consistent with previous episodes of BV.  We will treat empirically with clindamycin  given her history of allergy to Flagyl .  Discussed that she should take this with food to prevent GI upset and if she develops any diarrhea she is to stop the medication and be seen.  STI swab was collected and is pending.  We will contact her if we need to arrange additional treatment.  We did discuss strategies to decrease the frequency of recurrent BV including boric acid suppositories and potentially having her partner treated and recommend that she follow-up with OB/GYN if this continues to be a recurrent problem.  If she develops any pelvic pain, abdominal pain,  fever, nausea, vomiting she needs to be seen immediately.  Strict return precautions given.  She declined a work excuse note.  Final Clinical Impressions(s) / UC Diagnoses   Final diagnoses:  Vaginal discharge  Vaginal odor     Discharge Instructions      We are treating you for bacterial vaginosis.  Take clindamycin  3 times daily for 1 week.  Take this with food as it can upset your stomach.  We will contact you if we need to arrange any additional treatment.  If you develop any pelvic pain, abdominal pain, fever, nausea, vomiting you need to be seen immediately.  As we discussed, you can use boric acid suppositories into the vagina over-the-counter to help decrease the frequency of BV.  You could also have your partner treated.  I recommend following up with your OB/GYN if symptoms recur to investigate possible treatment options.     ED Prescriptions     Medication Sig Dispense Auth. Provider   clindamycin  (CLEOCIN ) 300 MG capsule Take 1 capsule (300 mg total) by mouth 3 (three) times daily. 21 capsule Janene Yousuf K, PA-C      PDMP not reviewed this encounter.   Sherrell Rocky POUR, PA-C 07/02/24 1028

## 2024-07-02 NOTE — ED Triage Notes (Signed)
 Patient presents to the office for vaginal discharge and odor. Patient has a history of BV.  Patient is highly allergic to FLAGYL .

## 2024-07-05 ENCOUNTER — Ambulatory Visit (HOSPITAL_COMMUNITY): Payer: Self-pay

## 2024-07-05 LAB — CERVICOVAGINAL ANCILLARY ONLY
Bacterial Vaginitis (gardnerella): POSITIVE — AB
Candida Glabrata: POSITIVE — AB
Candida Vaginitis: NEGATIVE
Chlamydia: NEGATIVE
Comment: NEGATIVE
Comment: NEGATIVE
Comment: NEGATIVE
Comment: NEGATIVE
Comment: NEGATIVE
Comment: NORMAL
Neisseria Gonorrhea: NEGATIVE
Trichomonas: POSITIVE — AB

## 2024-07-05 MED ORDER — TINIDAZOLE 500 MG PO TABS: 500.0000 mg | ORAL_TABLET | Freq: Two times a day (BID) | ORAL | 0 refills | Status: AC

## 2024-07-05 NOTE — Addendum Note (Signed)
 Addended by: JOESPH MANUELITA RAMAN on: 07/05/2024 05:50 PM   Modules accepted: Orders

## 2024-07-05 NOTE — Telephone Encounter (Addendum)
 There is no alternative treatment for trichomonas than the metronidazole  type drugs.  Because patient has allergic reaction to this medication, specifically develops blisters, she will need to be referred to an allergist for desensitization therapy.  I recommend that she reach out to [place] allergy partners, they have locations in [place] in [place].  The [place] locations for a number is [phone].  Personally, I believe that all of these providers are very good but I personally prefer either Dr. Ardith or Dr. Elizabeth.  In the meantime, she should abstain from sexual intercourse until she has completed treatment.  Addendum: Triage nurse contacted patient earlier today to share the above information.  Patient states that she does tolerate Tinidazole .    After reviewing her chart, I do see in her chart that she tested positive for trichomonas on May 24, 2024, was treated with Tinidazole  500 mg twice daily.  She returned for repeat testing on June 10, 2024 and tested positive again for trichomonas which is not surprising given that this was less than 3 weeks from her initial diagnosis.  Patient was inadvertently prescribed metronidazole  to which she had an cutaneous allergic reaction.  Patient was provided with prednisone  for treatment and, because patient was no longer having any symptoms of trichomonas, no further treatment was recommended.  Patient returned to urgent care on July 02, 2024 complaining of increased malodorous vaginal discharge concerned for BV.  Repeat testing revealed BV, trichomonas and candida glabrata.  Because it has been greater than 3 weeks since her initial diagnosis of trichomonas, recommend repeat treatment with denied as well.  Patient was again strongly encouraged to reach out to an allergy specialist for desensitization to metronidazole  given that this is the standard of care for treatment of trichomonas.    I have resent prescription for Tinidazole  500  mg twice daily x 5 days as this will be effective for treating not only possible continued or new trichomonas infection but will also be effective in treating bacterial vaginosis.  Will defer treatment for Candida glabrata until other treatments have been completed, treat as needed if vaginal symptoms have not resolved.  Recommend no further testing or treatment for trichomonas unless patient continues to have symptoms consistent with trichomonas and tests positive for trichomonas on or after August 24, 2024, 3 months after her initial positive result on May 24, 2024.

## 2024-07-12 DIAGNOSIS — H5213 Myopia, bilateral: Secondary | ICD-10-CM | POA: Diagnosis not present

## 2024-07-21 ENCOUNTER — Telehealth: Payer: Self-pay

## 2024-07-21 DIAGNOSIS — E039 Hypothyroidism, unspecified: Secondary | ICD-10-CM

## 2024-07-21 NOTE — Progress Notes (Signed)
 Complex Care Management Note  Care Guide Note 07/21/2024 Name: Angela Manning MRN: [id number] DOB: [birth year]  Angela Manning is a 38 y.o. year old female who sees Leontine Lapine, MD for primary care. I reached out to Clayborne GORMAN Lie by phone today to offer complex care management services.  Ms. Guzek was given information about Complex Care Management services today including:   The Complex Care Management services include support from the care team which includes your Nurse Care Manager, Clinical Social Worker, or Pharmacist.  The Complex Care Management team is here to help remove barriers to the health concerns and goals most important to you. Complex Care Management services are voluntary, and the patient may decline or stop services at any time by request to their care team member.   Complex Care Management Consent Status: Patient agreed to services and verbal consent obtained.   Follow up plan:  Telephone appointment with complex care management team member scheduled for:  07/26/24 at 9:00 a.m.   Encounter Outcome:  Patient Scheduled  Dream[facility]  [facility], [facility] Management Assistant Direct Dial: [phone]  Fax: [phone]

## 2024-07-26 ENCOUNTER — Other Ambulatory Visit: Payer: Self-pay

## 2024-07-26 NOTE — Patient Outreach (Signed)
 Complex Care Management   Visit Note  07/26/2024  Name:  JANECIA PALAU MRN: [id number] DOB: May 28, 1986  Situation: Referral received for Complex Care Management related to Hypothyroidism and Hypertension I obtained verbal consent from Patient.  Visit completed with patient   on the phone  Background:   Past Medical History:  Diagnosis Date   AKI (acute kidney injury) (HCC) 03/31/2024   Bacterial vaginosis    Hypertension    Hypertension affecting pregnancy, antepartum 10/27/2019   Ovarian cyst    Preeclampsia 09/10/2023   Pregnancy induced hypertension    Primary hypertension 03/03/2020    Assessment: Patient Reported Symptoms:  Cognitive Cognitive Status: No symptoms reported   Health Maintenance Behaviors: Exercise Healing Pattern: Unsure Health Facilitated by: Prayer/meditation  Neurological   Neurological Management Strategies: Coping strategies, Routine screening Neurological Self-Management Outcome: 4 (good)  HEENT HEENT Symptoms Reported: No symptoms reported HEENT Management Strategies: Coping strategies HEENT Self-Management Outcome: 4 (good)    Cardiovascular Cardiovascular Symptoms Reported: No symptoms reported Does patient have uncontrolled Hypertension?: Yes Patient's Recent BP reading at home: no BP cuff at home- Wil lget one Cardiovascular Management Strategies: Coping strategies, Routine screening Cardiovascular Self-Management Outcome: 4 (good)  Respiratory Respiratory Symptoms Reported: No symptoms reported Respiratory Management Strategies: Exercise Respiratory Self-Management Outcome: 4 (good)  Endocrine Endocrine Symptoms Reported: No symptoms reported Is patient diabetic?: No Endocrine Self-Management Outcome: 4 (good)  Gastrointestinal Gastrointestinal Symptoms Reported: No symptoms reported Gastrointestinal Management Strategies: Adequate rest, Coping strategies Gastrointestinal Self-Management Outcome: 4 (good)    Genitourinary  Genitourinary Symptoms Reported: No symptoms reported Genitourinary Management Strategies: Coping strategies, Fluid modification Genitourinary Self-Management Outcome: 4 (good)  Integumentary Integumentary Symptoms Reported: No symptoms reported Skin Management Strategies: Coping strategies, Exercise Skin Self-Management Outcome: 4 (good)  Musculoskeletal Musculoskelatal Symptoms Reviewed: No symptoms reported Musculoskeletal Management Strategies: Coping strategies, Exercise Musculoskeletal Self-Management Outcome: 4 (good) Falls in the past year?: No Number of falls in past year: 1 or less Was there an injury with Fall?: No Fall Risk Category Calculator: 0 Patient Fall Risk Level: Low Fall Risk Patient at Risk for Falls Due to: No Fall Risks Fall risk Follow up: Falls evaluation completed  Psychosocial Psychosocial Symptoms Reported: No symptoms reported Behavioral Management Strategies: Coping strategies, Adequate rest, Exercise Behavioral Health Self-Management Outcome: 4 (good) Major Change/Loss/Stressor/Fears (CP): Denies Techniques to Cope with Loss/Stress/Change: Exercise, Spiritual practice(s) Quality of Family Relationships: helpful, involved, supportive Do you feel physically threatened by others?: No      07/26/2024    9:28 AM  Depression screen PHQ 2/9  Decreased Interest 1  Down, Depressed, Hopeless 0  PHQ - 2 Score 1    There were no vitals filed for this visit.  Medications Reviewed Today     Reviewed by Kay Hendricks MATSU, RN (Case Manager) on 07/26/24 at 0913  Med List Status: <None>   Medication Order Taking? Sig Documenting Provider Last Dose Status Informant  amLODipine  (NORVASC ) 10 MG tablet [id number] Yes Take 1 tablet (10 mg total) by mouth daily. Atway, Rayann N, DO  Active   atenolol  (TENORMIN ) 25 MG tablet [id number] Yes Take 1 tablet (25 mg total) by mouth daily. Atway, Rayann N, DO  Active   clindamycin  (CLEOCIN ) 300 MG capsule [id number]  Take 1  capsule (300 mg total) by mouth 3 (three) times daily.  Patient not taking: Reported on 07/26/2024   Raspet, Erin K, PA-C  Active   colchicine  0.6 MG tablet [id number]  Take 1 tablet (0.6 mg total) by mouth 2 (  two) times daily.  Patient not taking: Reported on 07/26/2024   Norrine Sharper, MD  Active Self, Pharmacy Records  ibuprofen  (ADVIL ) 600 MG tablet [id number] Yes Take 1 tablet (600 mg total) by mouth 3 (three) times daily. Norrine Sharper, MD  Active Self, Pharmacy Records  methimazole  (TAPAZOLE ) 10 MG tablet [id number] Yes TAKE 2 TABLETS BY MOUTH 2 TIMES DAILY. Zheng, Michael, DO  Active    Patient not taking:   Discontinued 09/29/19 1234 spironolactone  (ALDACTONE ) 25 MG tablet [id number] Yes Take 1 tablet (25 mg total) by mouth daily. Jolaine Pac, DO  Active Self, Pharmacy Records            Recommendation:   Continue Current Plan of Care  Follow Up Plan:   Telephone follow-up 2 weeks   Hendricks Her RN, BSN  [place] [facility] RN Case Manager   Direct [phone]

## 2024-07-26 NOTE — Patient Instructions (Signed)
 Visit Information  Angela Manning was given information about Medicaid Managed Care team care coordination services as a part of their [facility] Community Plan Medicaid benefit.   If you would like to schedule transportation through your [facility], please call the following number at least 2 days in advance of your appointment: [phone]   Rides for urgent appointments can also be made after hours by calling Member Services.  Call the Behavioral Health Crisis Line at [phone], at any time, 24 hours a day, 7 days a week. If you are in danger or need immediate medical attention call 911.   Angela Manning - following are the goals we discussed in your visit today:   Goals Addressed             This Visit's Progress    VBCI RN Care Plan   On track    Problems:  Chronic Disease Management support and education needs related to HTN  Goal: Over the next 90  days the Patient will attend all scheduled medical appointments: with PCP  as evidenced by adhering to any follow up appointments         continue to work with RN Care Manager and/or Social Worker to address care management and care coordination needs related to HTN as evidenced by adherence to care management team scheduled appointments     demonstrate ongoing self health care management ability to monitor blood pressure as evidenced by taking BP daily and keeping a blood pressure log      not experience hospital admission as evidenced by review of electronic medical record. Hospital Admissions in last 6 months = 2 take all medications exactly as prescribed and will call provider for medication related questions as evidenced by communication with provider regarding any medication questions    verbalize understanding of plan for management of hypertension as evidenced by verbalization of daily BP log and DASH diet  Interventions:   Hypertension Interventions: Last practice recorded BP readings:  BP  Readings from Last 3 Encounters:  06/10/24 (!) 165/100  05/24/24 (!) 153/97  05/10/24 (!) 143/90   Most recent eGFR/CrCl:  Lab Results  Component Value Date   EGFR 95 09/18/2023    No components found for: CRCL  Evaluation of current treatment plan related to hypertension self management and patient's adherence to plan as established by provider Provided education to patient re: stroke prevention, s/s of heart attack and stroke Reviewed medications with patient and discussed importance of compliance Provided assistance with obtaining home blood pressure monitor via [facility] Medicaid benefit; Counseled on the importance of exercise goals with target of 150 minutes per week Discussed plans with patient for ongoing care management follow up and provided patient with direct contact information for care management team Advised patient, providing education and rationale, to monitor blood pressure daily and record, calling PCP for findings outside established parameters <140/90 mmHg Provided education on prescribed diet DASH  Screening for signs and symptoms of depression related to chronic disease state  Assessed social determinant of health barriers  Patient Self-Care Activities:  Attend all scheduled provider appointments Call pharmacy for medication refills 3-7 days in advance of running out of medications Call provider office for new concerns or questions  Notify RN Care Manager of TOC call rescheduling needs Take medications as prescribed   check blood pressure daily choose a place to take my blood pressure (home, clinic or office, retail store) write blood pressure results in a log or diary take  blood pressure log to all doctor appointments call doctor for signs and symptoms of high blood pressure keep all doctor appointments take medications for blood pressure exactly as prescribed eat more whole grains, fruits and vegetables, lean meats and healthy fats limit salt intake to 1500  mg/day  Plan:  Telephone follow up appointment with care management team member scheduled for:  08/11/2024 at 9:45 am           VBCI RN Care Plan   On track    Problems:  Chronic Disease Management support and education needs related to hypothyroidism  Goal: Over the next 90 days the Patient will attend all scheduled medical appointments: with provider as evidenced by adhering to appointment schedule        demonstrate a decrease Hypothyroidism in exacerbations as evidenced by feeling less fatiqued demonstrate ongoing self health care management ability of hypothyroidism as evidenced by adhering to medical care plan recommended by provider to include medication management and calling provider for any exacerbations    not experience hospital admission as evidenced by review of electronic medical record. Hospital Admissions in last 6 months = 2 take all medications exactly as prescribed and will call provider for medication related questions as evidenced by communication with provider regarding medication questions or concerns     verbalize basic understanding of hypothyroidism disease process and self health management plan as evidenced by verbalizing importance of medication adherence, following a healthy  diet and increasing daily water intake.   Interventions:   Evaluation of current treatment plan related to Hypothyroidism, patient self-management and patient's adherence to plan as established by provider. Discussed plans with patient for ongoing care management follow up and provided patient with direct contact information for care management team Evaluation of current treatment plan related to hypothyroidism and patient's adherence to plan as established by provider Provided education to patient and/or caregiver about advanced directives Reviewed medications with patient and discussed side effects vs severe adverse affects Provided patient with educational materials related to DASH  Diet  Discussed plans with patient for ongoing care management follow up and provided patient with direct contact information for care management team Screening for signs and symptoms of depression related to chronic disease state  Assessed social determinant of health barriers  Patient Self-Care Activities:  Attend all scheduled provider appointments Call pharmacy for medication refills 3-7 days in advance of running out of medications Call provider office for new concerns or questions  Notify RN Care Manager of TOC call rescheduling needs Perform all self care activities independently  Perform IADL's (shopping, preparing meals, housekeeping, managing finances) independently Take medications as prescribed    Plan:  Telephone follow up appointment with care management team member scheduled for:  08-11-2024 at 9:45 AM              Please see education materials related to Hypothyroidism and Hypertension provided by MyChart link.  Patient verbalizes understanding of instructions and care plan provided today and agrees to view in MyChart. Active MyChart status and patient understanding of how to access instructions and care plan via MyChart confirmed with patient.     The Managed Medicaid care management team will reach out to the patient again over the next 2 weeks Telephone follow up appointment with Managed Medicaid care management team member scheduled for: 08-11-2024 9:45 AM  Hendricks Her RN, BSN  [place] I VBCI-Population Health RN Case Manager   Direct [phone]   Following is a copy of your plan of  care:  There are no care plans that you recently modified to display for this patient.

## 2024-08-11 ENCOUNTER — Telehealth: Payer: Self-pay

## 2024-08-11 NOTE — Patient Instructions (Signed)
 Clayborne GORMAN Lie - I am sorry I was unable to reach you today for our scheduled appointment. I work with Nguyen, Diana, MD and am calling to support your healthcare needs. Please contact me at [phone] at your earliest convenience. I look forward to speaking with you soon.   Thank you,  Hendricks Her RN, BSN  [place] I VBCI-Population Health RN Case Manager   Direct [phone]

## 2024-08-18 ENCOUNTER — Telehealth: Payer: Self-pay

## 2024-08-18 NOTE — Patient Instructions (Signed)
 Clayborne GORMAN Lie - I am sorry I was unable to reach you today for our scheduled appointment. I work with Nguyen, Diana, MD and am calling to support your healthcare needs. Please contact me at [phone] at your earliest convenience. I look forward to speaking with you soon.   Thank you,  Hendricks Her RN, BSN  [place] I VBCI-Population Health RN Case Manager   Direct [phone]

## 2024-08-19 ENCOUNTER — Other Ambulatory Visit: Payer: Self-pay

## 2024-08-19 NOTE — Patient Instructions (Signed)
 Visit Information  Angela Manning was given information about Medicaid Managed Care team care coordination services as a part of their [facility] Community Plan Medicaid benefit.   If you would like to schedule transportation through your [facility], please call the following number at least 2 days in advance of your appointment: [phone]   Rides for urgent appointments can also be made after hours by calling Member Services.  Call the Behavioral Health Crisis Line at [phone], at any time, 24 hours a day, 7 days a week. If you are in danger or need immediate medical attention call 911.   Angela Manning - following are the goals we discussed in your visit today:   Goals Addressed             This Visit's Progress    VBCI RN Care Plan       Problems:  Chronic Disease Management support and education needs related to HTN  Goal: Over the next 90  days the Patient will attend all scheduled medical appointments: with PCP  as evidenced by adhering to any follow up appointments         continue to work with RN Care Manager and/or Social Worker to address care management and care coordination needs related to HTN as evidenced by adherence to care management team scheduled appointments     demonstrate ongoing self health care management ability to monitor blood pressure as evidenced by taking BP daily and keeping a blood pressure log      not experience hospital admission as evidenced by review of electronic medical record. Hospital Admissions in last 6 months = 2 take all medications exactly as prescribed and will call provider for medication related questions as evidenced by communication with provider regarding any medication questions    verbalize understanding of plan for management of hypertension as evidenced by verbalization of daily BP log and DASH diet  Interventions:   Hypertension Interventions: Last practice recorded BP readings:  BP Readings from  Last 3 Encounters:  08/19/24 (!) 145/89  06/10/24 (!) 165/100  05/24/24 (!) 153/97   Most recent eGFR/CrCl:  Lab Results  Component Value Date   EGFR 95 09/18/2023    No components found for: CRCL  Evaluation of current treatment plan related to hypertension self management and patient's adherence to plan as established by provider Provided education to patient re: stroke prevention, s/s of heart attack and stroke Reviewed medications with patient and discussed importance of compliance Provided assistance with obtaining home blood pressure monitor via [facility] Medicaid benefit; Counseled on the importance of exercise goals with target of 150 minutes per week Discussed plans with patient for ongoing care management follow up and provided patient with direct contact information for care management team Advised patient, providing education and rationale, to monitor blood pressure daily and record, calling PCP for findings outside established parameters <140/90 mmHg Provided education on prescribed diet DASH  Screening for signs and symptoms of depression related to chronic disease state  Assessed social determinant of health barriers  Patient Self-Care Activities:  Attend all scheduled provider appointments Call pharmacy for medication refills 3-7 days in advance of running out of medications Call provider office for new concerns or questions  Notify RN Care Manager of TOC call rescheduling needs Take medications as prescribed   check blood pressure daily choose a place to take my blood pressure (home, clinic or office, retail store) write blood pressure results in a log or diary take blood  pressure log to all doctor appointments call doctor for signs and symptoms of high blood pressure keep all doctor appointments take medications for blood pressure exactly as prescribed eat more whole grains, fruits and vegetables, lean meats and healthy fats limit salt intake to 1500 mg/day  Plan:   Telephone follow up appointment with care management team member scheduled for:  09-20-2024 at 9:00 am           VBCI RN Care Plan       Problems:  Chronic Disease Management support and education needs related to hypothyroidism  Goal: Over the next 90 days the Patient will attend all scheduled medical appointments: with provider as evidenced by adhering to appointment schedule        demonstrate a decrease Hypothyroidism in exacerbations as evidenced by feeling less fatiqued demonstrate ongoing self health care management ability of hypothyroidism as evidenced by adhering to medical care plan recommended by provider to include medication management and calling provider for any exacerbations    not experience hospital admission as evidenced by review of electronic medical record. Hospital Admissions in last 6 months = 2 take all medications exactly as prescribed and will call provider for medication related questions as evidenced by communication with provider regarding medication questions or concerns     verbalize basic understanding of hypothyroidism disease process and self health management plan as evidenced by verbalizing importance of medication adherence, following a healthy  diet and increasing daily water intake.   Interventions:   Evaluation of current treatment plan related to Hypothyroidism, patient self-management and patient's adherence to plan as established by provider. Discussed plans with patient for ongoing care management follow up and provided patient with direct contact information for care management team Evaluation of current treatment plan related to hypothyroidism and patient's adherence to plan as established by provider Provided education to patient and/or caregiver about advanced directives Reviewed medications with patient and discussed side effects vs severe adverse affects Provided patient with educational materials related to DASH Diet  Discussed plans  with patient for ongoing care management follow up and provided patient with direct contact information for care management team Screening for signs and symptoms of depression related to chronic disease state  Assessed social determinant of health barriers  Patient Self-Care Activities:  Attend all scheduled provider appointments Call pharmacy for medication refills 3-7 days in advance of running out of medications Call provider office for new concerns or questions  Notify RN Care Manager of TOC call rescheduling needs Perform all self care activities independently  Perform IADL's (shopping, preparing meals, housekeeping, managing finances) independently Take medications as prescribed    Plan:  Telephone follow up appointment with care management team member scheduled for:  09-20-2024 at 9:00 AM              Please see education materials related to hypertension and hyperthyroidism provided by MyChart link.  Patient verbalizes understanding of instructions and care plan provided today and agrees to view in MyChart. Active MyChart status and patient understanding of how to access instructions and care plan via MyChart confirmed with patient.     Telephone follow up appointment with Managed Medicaid care management team member scheduled for:08-21-2024 at 9:00 AM   Hendricks Her RN, BSN  [place] I VBCI-Population Health RN Case Manager   Direct [phone]   Following is a copy of your plan of care:  There are no care plans that you recently modified to display for this patient.

## 2024-08-20 ENCOUNTER — Ambulatory Visit (HOSPITAL_COMMUNITY)
Admission: RE | Admit: 2024-08-20 | Discharge: 2024-08-20 | Disposition: A | Discharge status: Home / Self Care | Source: Ambulatory Visit | Attending: Emergency Medicine | Admitting: Emergency Medicine

## 2024-08-20 ENCOUNTER — Encounter (HOSPITAL_COMMUNITY): Payer: Self-pay

## 2024-08-20 VITALS — BP 143/91 | HR 58 | Temp 98.2°F | Resp 16

## 2024-08-20 DIAGNOSIS — B9689 Other specified bacterial agents as the cause of diseases classified elsewhere: Secondary | ICD-10-CM | POA: Insufficient documentation

## 2024-08-20 DIAGNOSIS — N760 Acute vaginitis: Secondary | ICD-10-CM | POA: Insufficient documentation

## 2024-08-20 MED ORDER — CLINDAMYCIN HCL 300 MG PO CAPS: 300.0000 mg | ORAL_CAPSULE | Freq: Three times a day (TID) | ORAL | 0 refills | Status: AC

## 2024-08-20 NOTE — ED Provider Notes (Signed)
 [facility]    CSN: [id number] Arrival date & time: 08/20/24  0940      History   Chief Complaint Chief Complaint  Patient presents with   Vaginal Discharge    HPI MIREL HUNDAL is a 38 y.o. female.   Patient presents today with vaginal discharge and foul odor for the past 2 days.  Patient states she.  Gets BV symptoms very frequently last time was in July.  Would like to have STD testing.  She has not been with her last partner since last positive results.  Denies any fevers nausea vomiting or diarrhea.  Has not taken anything prior to arrival    Past Medical History:  Diagnosis Date   AKI (acute kidney injury) (HCC) 03/31/2024   Bacterial vaginosis    Hypertension    Hypertension affecting pregnancy, antepartum 10/27/2019   Ovarian cyst    Preeclampsia 09/10/2023   Pregnancy induced hypertension    Primary hypertension 03/03/2020    Patient Active Problem List   Diagnosis Date Noted   Left ankle pain 05/10/2024   Hyperkalemia 03/31/2024   Altered mental status 03/30/2024   Acute pericarditis 02/29/2024   Resistant hypertension 02/27/2024   Chest pain 02/27/2024   Thyrotoxicosis due to Graves' disease 02/27/2024   Weakness of both lower extremities 02/09/2024   Thyroid  enlargement 02/09/2024   Abnormal Pap smear of cervix 10/23/2021   Generalized anxiety disorder 04/19/2021   Primary hypertension 03/03/2020   Ovarian cyst, left 03/01/2015   Menorrhagia with irregular cycle 03/01/2015   Dyspareunia 03/01/2015    Past Surgical History:  Procedure Laterality Date   DILATION AND EVACUATION N/A 02/21/2013   Procedure: DILATATION AND EVACUATION;  Surgeon: Glenys GORMAN Birk, MD;  Location: [facility];  Service: Gynecology;  Laterality: N/A;    OB History     Gravida  6   Para  4   Term  3   Preterm  1   AB      Living  4      SAB      IAB      Ectopic      Multiple      Live Births  4            Home Medications    Prior to  Admission medications   Medication Sig Start Date End Date Taking? Authorizing Provider  amLODipine  (NORVASC ) 10 MG tablet Take 1 tablet (10 mg total) by mouth daily. 05/10/24   Atway, Rayann N, DO  atenolol  (TENORMIN ) 25 MG tablet Take 1 tablet (25 mg total) by mouth daily. 05/10/24   Atway, Rayann N, DO  clindamycin  (CLEOCIN ) 300 MG capsule Take 1 capsule (300 mg total) by mouth 3 (three) times daily. Patient not taking: Reported on 08/19/2024 07/02/24   Raspet, Erin K, PA-C  colchicine  0.6 MG tablet Take 1 tablet (0.6 mg total) by mouth 2 (two) times daily. Patient not taking: Reported on 08/19/2024 02/29/24   McLendon, Michael, MD  ibuprofen  (ADVIL ) 600 MG tablet Take 1 tablet (600 mg total) by mouth 3 (three) times daily. 02/29/24   Norrine Sharper, MD  methimazole  (TAPAZOLE ) 10 MG tablet TAKE 2 TABLETS BY MOUTH 2 TIMES DAILY. 06/02/24   Zheng, Michael, DO  spironolactone  (ALDACTONE ) 25 MG tablet Take 1 tablet (25 mg total) by mouth daily. 02/05/24   Jolaine Pac, DO  norethindrone  (MICRONOR ,CAMILA ,ERRIN ) 0.35 MG tablet Take 1 tablet (0.35 mg total) by mouth daily. Patient not taking: Reported on 08/23/2019 10/07/18  09/29/19  Nicholaus Burnard HERO, MD    Family History Family History  Problem Relation Age of Onset   Stroke Mother    Hypertension Mother    Valvular heart disease Mother    Healthy Father    Hypertension Maternal Grandmother    Hypertension Paternal Grandmother     Social History Social History   Tobacco Use   Smoking status: Former    Current packs/day: 0.10    Types: Cigarettes   Smokeless tobacco: Former    Quit date: 01/08/2015   Tobacco comments:    Smokes sometimes.  Vaping Use   Vaping status: Never Used  Substance Use Topics   Alcohol use: Not Currently   Drug use: Not Currently     Allergies   Ace inhibitors and Flagyl  [metronidazole ]   Review of Systems Review of Systems  Constitutional:  Negative for chills and fever.  Respiratory: Negative.     Cardiovascular: Negative.   Gastrointestinal: Negative.   Genitourinary:  Positive for vaginal discharge. Negative for vaginal bleeding and vaginal pain.       Foul odor.  Last menstrual cycle was last week.  Does noticed that the symptoms occur more frequently with timing of cycle  Neurological: Negative.      Physical Exam Triage Vital Signs ED Triage Vitals  Encounter Vitals Group     BP 08/20/24 1003 (!) 143/91     Girls Systolic BP Percentile --      Girls Diastolic BP Percentile --      Boys Systolic BP Percentile --      Boys Diastolic BP Percentile --      Pulse Rate 08/20/24 1003 (!) 58     Resp 08/20/24 1003 16     Temp 08/20/24 1003 98.2 F (36.8 C)     Temp Source 08/20/24 1003 Oral     SpO2 08/20/24 1003 97 %     Weight --      Height --      Head Circumference --      Peak Flow --      Pain Score 08/20/24 1002 0     Pain Loc --      Pain Education --      Exclude from Growth Chart --    No data found.  Updated Vital Signs BP (!) 143/91 (BP Location: Right Arm)   Pulse (!) 58   Temp 98.2 F (36.8 C) (Oral)   Resp 16   LMP 08/09/2024 (Exact Date)   SpO2 97%   Visual Acuity Right Eye Distance:   Left Eye Distance:   Bilateral Distance:    Right Eye Near:   Left Eye Near:    Bilateral Near:     Physical Exam Constitutional:      Appearance: Normal appearance.  Cardiovascular:     Rate and Rhythm: Normal rate.  Pulmonary:     Effort: Pulmonary effort is normal.  Abdominal:     General: Abdomen is flat.     Tenderness: There is no right CVA tenderness or left CVA tenderness.  Neurological:     General: No focal deficit present.     Mental Status: She is alert.      UC Treatments / Results  Labs (all labs ordered are listed, but only abnormal results are displayed) Labs Reviewed - No data to display  EKG   Radiology No results found.  Procedures Procedures (including critical care time)  Medications Ordered in UC Medications  - No  data to display  Initial Impression / Assessment and Plan / UC Course  I have reviewed the triage vital signs and the nursing notes.  Pertinent labs & imaging results that were available during my care of the patient were reviewed by me and considered in my medical decision making (see chart for details).     Will give a longer dose of antibiotics take with food Discussed with patient if it is with the same partner partner may need to be treated Using boric acid over-the-counter as needed Discussed with OB/GYN possible causes or treatment if she is getting continuously  Final Clinical Impressions(s) / UC Diagnoses   Final diagnoses:  None   Discharge Instructions   None    ED Prescriptions   None    PDMP not reviewed this encounter.   Merilee Andrea CROME, NP 08/20/24 1028

## 2024-08-20 NOTE — ED Triage Notes (Signed)
 Pt states clear vaginal discharge for the past 2 days.  States she has a history of BV.

## 2024-08-20 NOTE — Discharge Instructions (Addendum)
 Check your MyChart for results in 3 days of testing if any positive results we will call with treatment Discussed using boric acid vaginal suppositories Take full dose of antibiotics with food

## 2024-08-23 LAB — CERVICOVAGINAL ANCILLARY ONLY
Bacterial Vaginitis (gardnerella): POSITIVE — AB
Candida Glabrata: NEGATIVE
Candida Vaginitis: NEGATIVE
Chlamydia: NEGATIVE
Comment: NEGATIVE
Comment: NEGATIVE
Comment: NEGATIVE
Comment: NEGATIVE
Comment: NEGATIVE
Comment: NORMAL
Neisseria Gonorrhea: NEGATIVE
Trichomonas: POSITIVE — AB

## 2024-08-25 ENCOUNTER — Ambulatory Visit (HOSPITAL_COMMUNITY): Payer: Self-pay

## 2024-08-27 ENCOUNTER — Telehealth: Payer: Self-pay | Admitting: Internal Medicine

## 2024-08-27 MED ORDER — BORIC ACID 600 MG VA SUPP: 1.0000 | Freq: Two times a day (BID) | VAGINAL | 0 refills | Status: AC

## 2024-08-27 NOTE — Telephone Encounter (Signed)
 Patient tested positive for trichomoniasis as well as BV. Patient is allergic to flagyl . Another provided gave the patient the option to either try desenization to the flagyl  or to take a vaginal boric acid for at least 60 days. She chose the boric acid. Nurse requesting RX be sent for the vaginal boric acid. I reviewed the practice of using boric acid as treatment course for trichomoniasis and per UpToDate patient will need Vaginal Boric Acid 600mg  intravaginally twice daily for at least 60 days. Recommend condom use during this time and follow up for retesting one long term treatment as been completed. At any time, will pregnancy be a possibility RX needs to be discontinued. Nurse will verify that patient is not currently pregnant.

## 2024-08-30 ENCOUNTER — Ambulatory Visit: Payer: Self-pay

## 2024-08-30 NOTE — Telephone Encounter (Signed)
 FYI Only or Action Required?: FYI only for provider.  Patient was last seen in primary care on 05/10/2024 by Atway, Rayann N, DO.  Called Nurse Triage reporting eye droop.  Symptoms began several weeks ago.  Interventions attempted: Other: new eyeglasses.  Symptoms are: unchanged.  Triage Disposition: Go to ED Now (or PCP Triage)  Patient/caregiver understands and will follow disposition?: Yes  Copied from CRM #[id number]. Topic: Clinical - Red Word Triage >> Aug 30, 2024  4:08 PM Angela Manning wrote: Red Word that prompted transfer to Nurse Triage: Patient called in stating that she's not sure of what's going on with the right side of her face. States her eye is drooping and she's not sure why. States it has been ongoing for about 3 weeks. Got new glasses but it's not helping the issue. Reason for Disposition  Bell's palsy suspected (i.e., weakness on only one side of the face, developing over hours to days, no other symptoms)  Answer Assessment - Initial Assessment Questions Additional info: Symptoms of eye drooping for approximately 3 weeks, she thought she needed new glasses, she has new glasses now but symptoms are not improving. Patient is proceeding to urgent care. Per chart review patient had negative neurologic exam on 08/20/24 at urgent care-urgent care evaluation was for other symptoms.   1. SYMPTOM: What is the main symptom you are concerned about? (e.g., weakness, numbness)     Right eye drooping 2. ONSET: When did this start? (e.g., minutes, hours, days; while sleeping)     3 weeks ago 3. LAST NORMAL: When was the last time you (the patient) were normal (no symptoms)?     3 weeks ago 4. PATTERN Does this come and go, or has it been constant since it started?  Is it present now?     constant 5. CARDIAC SYMPTOMS: Have you had any of the following symptoms: chest pain, difficulty breathing, palpitations?     denies 6. NEUROLOGIC SYMPTOMS: Have you had any of the  following symptoms: headache, dizziness, vision loss, double vision, changes in speech, unsteady on your feet?     denies 7. OTHER SYMPTOMS: Do you have any other symptoms?     denies  Protocols used: Neurologic Deficit-A-AH

## 2024-08-31 ENCOUNTER — Ambulatory Visit (HOSPITAL_COMMUNITY)

## 2024-09-01 ENCOUNTER — Ambulatory Visit (HOSPITAL_COMMUNITY)
Admission: RE | Admit: 2024-09-01 | Discharge: 2024-09-01 | Disposition: A | Discharge status: Home / Self Care | Source: Ambulatory Visit | Attending: Internal Medicine | Admitting: Internal Medicine

## 2024-09-01 ENCOUNTER — Encounter (HOSPITAL_COMMUNITY): Payer: Self-pay

## 2024-09-01 VITALS — BP 137/95 | HR 54 | Temp 98.5°F | Resp 16

## 2024-09-01 DIAGNOSIS — G510 Bell's palsy: Secondary | ICD-10-CM | POA: Diagnosis not present

## 2024-09-01 MED ORDER — PREDNISONE 20 MG PO TABS: 60.0000 mg | ORAL_TABLET | Freq: Every day | ORAL | 0 refills | Status: AC

## 2024-09-01 NOTE — Discharge Instructions (Signed)
 I believe you have Bell's Palsy. I prescribed prednisone  60 mg daily x 1 week. I recommend using lubricating eye drops in your right eye. Please call your primary care doctor to schedule follow up next week. Return to care if symptoms worsen or do not improve.

## 2024-09-01 NOTE — ED Triage Notes (Signed)
 Pt states right sided facial droop for 3 weeks. States she called her doctor and was not able to get an appointment.  Slight right sided facial droop noted.

## 2024-09-01 NOTE — ED Provider Notes (Signed)
 [facility]    CSN: [id number] Arrival date & time: 09/01/24  1201      History   Chief Complaint Chief Complaint  Patient presents with   Facial Droop    HPI ZURIYAH SHATZ is a 38 y.o. female presenting to urgent care endorsing a 3-week history of right-sided facial droop and eye irritation.  Symptoms began abruptly 3 weeks ago.  She denies numbness along the right side of her face.  She notices a discrepancy when she looks herself in the mirror.  Denies difficulty swallowing.  Denies numbness or weakness in the upper and lower extremities.  No visual disturbance noted.  She states that this has not happened previously.  Overall she feels that her symptoms have slightly worsened.  She states that her grandmother has a history of Bell's palsy.   Past Medical History:  Diagnosis Date   AKI (acute kidney injury) 03/31/2024   Bacterial vaginosis    Hypertension    Hypertension affecting pregnancy, antepartum 10/27/2019   Ovarian cyst    Preeclampsia 09/10/2023   Pregnancy induced hypertension    Primary hypertension 03/03/2020    Patient Active Problem List   Diagnosis Date Noted   Left ankle pain 05/10/2024   Hyperkalemia 03/31/2024   Altered mental status 03/30/2024   Acute pericarditis 02/29/2024   Resistant hypertension 02/27/2024   Chest pain 02/27/2024   Thyrotoxicosis due to Graves' disease 02/27/2024   Weakness of both lower extremities 02/09/2024   Thyroid  enlargement 02/09/2024   Abnormal Pap smear of cervix 10/23/2021   Generalized anxiety disorder 04/19/2021   Primary hypertension 03/03/2020   Ovarian cyst, left 03/01/2015   Menorrhagia with irregular cycle 03/01/2015   Dyspareunia 03/01/2015    Past Surgical History:  Procedure Laterality Date   DILATION AND EVACUATION N/A 02/21/2013   Procedure: DILATATION AND EVACUATION;  Surgeon: Glenys GORMAN Birk, MD;  Location: [facility];  Service: Gynecology;  Laterality: N/A;    OB History     Gravida   6   Para  4   Term  3   Preterm  1   AB      Living  4      SAB      IAB      Ectopic      Multiple      Live Births  4            Home Medications    Prior to Admission medications   Medication Sig Start Date End Date Taking? Authorizing Provider  predniSONE  (DELTASONE ) 20 MG tablet Take 3 tablets (60 mg total) by mouth daily with breakfast for 7 days. 09/01/24 09/08/24 Yes Melvenia Manus BRAVO, MD  amLODipine  (NORVASC ) 10 MG tablet Take 1 tablet (10 mg total) by mouth daily. 05/10/24   Atway, Rayann N, DO  atenolol  (TENORMIN ) 25 MG tablet Take 1 tablet (25 mg total) by mouth daily. 05/10/24   Atway, Rayann N, DO  Boric Acid 600 MG SUPP Place 1 Application vaginally in the morning and at bedtime. 08/27/24 10/26/24  Hermanns, Ashlee P, PA-C  colchicine  0.6 MG tablet Take 1 tablet (0.6 mg total) by mouth 2 (two) times daily. Patient not taking: Reported on 08/19/2024 02/29/24   McLendon, Michael, MD  ibuprofen  (ADVIL ) 600 MG tablet Take 1 tablet (600 mg total) by mouth 3 (three) times daily. 02/29/24   Norrine Sharper, MD  methimazole  (TAPAZOLE ) 10 MG tablet TAKE 2 TABLETS BY MOUTH 2 TIMES DAILY. 06/02/24  Zheng, Michael, DO  spironolactone  (ALDACTONE ) 25 MG tablet Take 1 tablet (25 mg total) by mouth daily. 02/05/24   Jolaine Pac, DO  norethindrone  (MICRONOR ,CAMILA ,ERRIN ) 0.35 MG tablet Take 1 tablet (0.35 mg total) by mouth daily. Patient not taking: Reported on 08/23/2019 10/07/18 09/29/19  Nicholaus Burnard HERO, MD    Family History Family History  Problem Relation Age of Onset   Stroke Mother    Hypertension Mother    Valvular heart disease Mother    Healthy Father    Hypertension Maternal Grandmother    Hypertension Paternal Grandmother     Social History Social History   Tobacco Use   Smoking status: Former    Current packs/day: 0.10    Types: Cigarettes   Smokeless tobacco: Former    Quit date: 01/08/2015   Tobacco comments:    Smokes sometimes.  Vaping Use    Vaping status: Never Used  Substance Use Topics   Alcohol use: Not Currently   Drug use: Not Currently     Allergies   Ace inhibitors and Flagyl  [metronidazole ]   Review of Systems Review of Systems  Neurological:  Positive for facial asymmetry.     Physical Exam Triage Vital Signs ED Triage Vitals  Encounter Vitals Group     BP 09/01/24 1218 (!) 137/95     Girls Systolic BP Percentile --      Girls Diastolic BP Percentile --      Boys Systolic BP Percentile --      Boys Diastolic BP Percentile --      Pulse Rate 09/01/24 1218 (!) 54     Resp 09/01/24 1218 16     Temp 09/01/24 1218 98.5 F (36.9 C)     Temp Source 09/01/24 1218 Oral     SpO2 09/01/24 1218 98 %     Weight --      Height --      Head Circumference --      Peak Flow --      Pain Score 09/01/24 1217 0     Pain Loc --      Pain Education --      Exclude from Growth Chart --    No data found.  Updated Vital Signs BP (!) 137/95 (BP Location: Left Arm)   Pulse (!) 54   Temp 98.5 F (36.9 C) (Oral)   Resp 16   LMP 08/09/2024 (Exact Date)   SpO2 98%   Physical Exam Vitals reviewed.  Constitutional:      General: She is not in acute distress.    Appearance: She is not toxic-appearing.  Neurological:     Mental Status: She is alert.     Cranial Nerves: Facial asymmetry (Discrepancy with smiling with mild loss of nasolabial fold on the right, inability to completely wrinkle the forehead brow on the right, right upper eyelid) present.     Sensory: Sensation is intact.     Coordination: Coordination is intact.     Gait: Gait is intact.    UC Treatments / Results  Labs (all labs ordered are listed, but only abnormal results are displayed) Labs Reviewed - No data to display  EKG   Radiology No results found.  Procedures Procedures (including critical care time)  Medications Ordered in UC Medications - No data to display  Initial Impression / Assessment and Plan / UC Course  I have  reviewed the triage vital signs and the nursing notes.  Pertinent labs & imaging results that were  available during my care of the patient were reviewed by me and considered in my medical decision making (see chart for details).    Patient is a 38 year old woman presenting to urgent care endorsing an 3-week history of right sided facial droop.  She believes symptoms have slightly worsened.  On exam there is right unilateral facial droop with upper and lower facial involvement.  Her history and exam findings are concerning for Bell's palsy.  Treatment options reviewed.  I prescribed prednisone  60 mg daily x 7 days given worsening of symptoms.  We reviewed that symptoms can worsen for up to 3 weeks.  No indication for antiviral therapy currently.  I recommended lubricating eyedrops due to incomplete closure of the right eyelid.  I also recommended that she contact her PCP to schedule follow-up for next week.  She was instructed to return to care if symptoms worsen or fail to improve.  Patient is in agreement with this plan and is medically stable for discharge at this time.  Final Clinical Impressions(s) / UC Diagnoses   Final diagnoses:  Right-sided Bell's palsy     Discharge Instructions      I believe you have Bell's Palsy. I prescribed prednisone  60 mg daily x 1 week. I recommend using lubricating eye drops in your right eye. Please call your primary care doctor to schedule follow up next week. Return to care if symptoms worsen or do not improve.     ED Prescriptions     Medication Sig Dispense Auth. Provider   predniSONE  (DELTASONE ) 20 MG tablet Take 3 tablets (60 mg total) by mouth daily with breakfast for 7 days. 21 tablet Kenndra Morris E, MD      PDMP not reviewed this encounter.   Melvenia Manus BRAVO, MD 09/01/24 1254

## 2024-09-07 ENCOUNTER — Other Ambulatory Visit: Payer: Self-pay

## 2024-09-07 DIAGNOSIS — I10 Essential (primary) hypertension: Secondary | ICD-10-CM

## 2024-09-07 NOTE — Telephone Encounter (Signed)
 Copied from CRM [id number]. Topic: Clinical - Medication Refill >> Sep 07, 2024  8:45 AM Angela Manning wrote: Medication: amLODipine  (NORVASC ) 10 MG tablet  Has the patient contacted their pharmacy? No was out of refills  This is the patient's preferred pharmacy:  [facility] [phone] [address], [place] - [address] [place] [address] Phone: [phone] Fax: [phone]  Is this the correct pharmacy for this prescription? Yes If no, delete pharmacy and type the correct one.   Has the prescription been filled recently? Yes  Is the patient out of the medication? Yes  Has the patient been seen for an appointment in the last year OR does the patient have an upcoming appointment? Yes  Can we respond through MyChart? Yes  Agent: Please be advised that Rx refills may take up to 3 business days. We ask that you follow-up with your pharmacy.

## 2024-09-07 NOTE — Telephone Encounter (Signed)
 There are 2 RF's left on Amlodipine  rx. Pt was called ; stated she did not call the pharmacy first, but she will.

## 2024-09-16 ENCOUNTER — Ambulatory Visit (INDEPENDENT_AMBULATORY_CARE_PROVIDER_SITE_OTHER): Payer: Self-pay

## 2024-09-16 VITALS — BP 137/94 | HR 70 | Temp 98.4°F | Ht 67.0 in | Wt 150.8 lb

## 2024-09-16 DIAGNOSIS — Z79899 Other long term (current) drug therapy: Secondary | ICD-10-CM | POA: Diagnosis not present

## 2024-09-16 DIAGNOSIS — Z87891 Personal history of nicotine dependence: Secondary | ICD-10-CM

## 2024-09-16 DIAGNOSIS — Z8249 Family history of ischemic heart disease and other diseases of the circulatory system: Secondary | ICD-10-CM | POA: Diagnosis not present

## 2024-09-16 DIAGNOSIS — G510 Bell's palsy: Secondary | ICD-10-CM | POA: Diagnosis not present

## 2024-09-16 DIAGNOSIS — I10 Essential (primary) hypertension: Secondary | ICD-10-CM

## 2024-09-16 NOTE — Patient Instructions (Addendum)
 Today we discussed the following medical conditions and plan:   For your bells palsy, I'm happy to see that this is improving! For your eyes, get some lubricating eye drops to help with some of the blurriness and let us  know if this persists.   For your blood pressure, getting a blood pressure cuff and monitoring the blood pressure either before you take your medications or 1-2 hours afterwards   We look forward to seeing you next time. Please call our clinic at [phone] if you have any questions or concerns. The best time to call is Monday-Friday from 9am-4pm, but there is someone available 24/7. If you need medication refills, please notify your pharmacy one week in advance and they will send us  a request.   Thank you for trusting me with your care. Wishing you the best!   Shahid Flori D'Mello, DO  [facility]

## 2024-09-16 NOTE — Progress Notes (Signed)
 Internal Medicine Clinic Attending  I was physically present during the key portions of the resident provided service and participated in the medical decision making of patient's management care. I reviewed pertinent patient test results.  The assessment, diagnosis, and plan were formulated together and I agree with the documentation in the resident's note.  Jeanelle Layman CROME, MD

## 2024-09-16 NOTE — Progress Notes (Addendum)
 Established Patient Office Visit  Subjective   Patient ID: Angela Manning, female    DOB: 1986/02/17  Age: 38 y.o. MRN: [id number]  Chief Complaint  Patient presents with   Eye Problem    Fu from urgent care    HPI Charleene Manning is a 38 year old female with Past Medical History of Graves' disease, resistant hypertension, generalized anxiety disorder that presents today for follow for bells palsy. Please see problem based assessment for more details.    ROS See problem based assessment for more details    Objective:     BP (!) 137/94 (BP Location: Left Arm, Patient Position: Sitting, Cuff Size: Normal)   Pulse 70   Temp 98.4 F (36.9 C) (Oral)   Ht 5' 7 (1.702 m)   Wt 150 lb 12.8 oz (68.4 kg)   LMP 08/09/2024 (Exact Date)   SpO2 98%   BMI 23.62 kg/m  BP Readings from Last 3 Encounters:  09/16/24 (!) 137/94  09/01/24 (!) 137/95  08/20/24 (!) 143/91   Wt Readings from Last 3 Encounters:  09/16/24 150 lb 12.8 oz (68.4 kg)  05/10/24 146 lb 3.2 oz (66.3 kg)  03/30/24 140 lb (63.5 kg)      Physical Exam Constitution:alert, no acute distress Eyes: pupils are reactively to light bilaterally and equally. No noted conjunctival injection. Extraocular movements intact  Lungs: no respiratory distress, clear to auscultation Heart: regular rate and rhytym, no murmurs heard Face: mild eye lid drooping on right side of face with mild flattening of nasolabial fold on right. Symmetric eyebrow raising and patient is able to shut eye lids. Visual acuity in right eye is 20/25 with glasses   No results found for any visits on 09/16/24.    The ASCVD Risk score (Arnett DK, et al., 2019) failed to calculate for the following reasons:   The 2019 ASCVD risk score is only valid for ages 85 to 21    Assessment & Plan:   Problem List Items Addressed This Visit     Primary hypertension - Primary   Patient is on amlodipine  10 mg, atenolol  25 mg and spironolactone  25.  States that  she took these this morning about an hour before the visit.  States that she does not monitor her blood pressure regularly.  Blood pressure today is 137/94   Plan: Informed patient to get a brachial blood pressure cuff and monitor the blood pressure at least once a day, write it down and bring it to the next appointment Will continue on amlodipine  10 mg, atenolol  25 and spironolactone  25      Right-sided Bell's palsy   States that she had about 3-week history of right sided facial drooping before presenting to urgent care for this.  States that grandmother has history of Bell's palsy.  Did not have any preceding infections.  She was able to take a 1 week course of prednisone  and states that this improved symptoms greatly.  She is having some blurry vision today but is able to close her eye lid completely.  She said that she was not recommended to do any taping.  Visual acuity is good today.  Plan: Counseled patient that this may take many more weeks to resolve but likely seems like she has a mild case. Also counseled patient on getting any lubricating drops to help with blurry vision, although reassuring that visual acuity is good       Return in about 3 months (around 12/17/2024) for  FOLLOW UP HTN.    Angela Kontos D'Mello, DO Patient seen with Dr. Jeanelle

## 2024-09-16 NOTE — Assessment & Plan Note (Signed)
 Patient is on amlodipine  10 mg, atenolol  25 mg and spironolactone  25.  States that she took these this morning about an hour before the visit.  States that she does not monitor her blood pressure regularly.  Blood pressure today is 137/94   Plan: Informed patient to get a brachial blood pressure cuff and monitor the blood pressure at least once a day, write it down and bring it to the next appointment Will continue on amlodipine  10 mg, atenolol  25 and spironolactone  25

## 2024-09-16 NOTE — Assessment & Plan Note (Signed)
 States that she had about 3-week history of right sided facial drooping before presenting to urgent care for this.  States that grandmother has history of Bell's palsy.  Did not have any preceding infections.  She was able to take a 1 week course of prednisone  and states that this improved symptoms greatly.  She is having some blurry vision today but is able to close her eye lid completely.  She said that she was not recommended to do any taping.  Visual acuity is good today.  Plan: Counseled patient that this may take many more weeks to resolve but likely seems like she has a mild case. Also counseled patient on getting any lubricating drops to help with blurry vision, although reassuring that visual acuity is good

## 2024-09-20 ENCOUNTER — Telehealth: Payer: Self-pay

## 2024-09-20 NOTE — Patient Instructions (Signed)
 Angela Manning - I am sorry I was unable to reach you today for our scheduled appointment. I work with Nguyen, Diana, MD and am calling to support your healthcare needs. I have rescheduled you for this Wednesday, 09-22-2024 at 2:00 PM Please contact me at [phone] with any questions. I look forward to speaking with you soon.   Thank you,  Hendricks Her RN, BSN  [place] [facility] RN Case Manager   Direct [phone]

## 2024-09-22 ENCOUNTER — Telehealth: Payer: Self-pay

## 2024-09-22 NOTE — Patient Instructions (Signed)
 Clayborne GORMAN Lie - I am sorry I was unable to complete the call today for our scheduled appointment. I work with Nguyen, Diana, MD and am calling to support your healthcare needs. I have rescheduled you for tomorrow am at 9:00 10-16. . I look forward to speaking with you soon.   Thank you,  Hendricks Her RN, BSN  [place] [facility] RN Case Manager   Direct [phone]

## 2024-09-23 ENCOUNTER — Other Ambulatory Visit: Payer: Self-pay

## 2024-09-23 NOTE — Patient Instructions (Signed)
 Visit Information  Angela Manning was given information about Medicaid Managed Care team care coordination services as a part of their [facility] Community Plan Medicaid benefit.   If you would like to schedule transportation through your [facility], please call the following number at least 2 days in advance of your appointment: [phone]   Rides for urgent appointments can also be made after hours by calling Member Services.  Call the Behavioral Health Crisis Line at [phone], at any time, 24 hours a day, 7 days a week. If you are in danger or need immediate medical attention call 911.  Please see education materials related to hypertension and hypothyroidism provided by MyChart link.  Patient verbalizes understanding of instructions and care plan provided today and agrees to view in MyChart. Active MyChart status and patient understanding of how to access instructions and care plan via MyChart confirmed with patient.     Telephone follow up appointment with Managed Medicaid care management team member scheduled for: 10-26-2024 at 9:00 AM   Angela Her RN, BSN  [place] [facility] RN Case Manager   Direct [phone]   Following is a copy of your plan of care:  There are no care plans that you recently modified to display for this patient.

## 2024-10-20 ENCOUNTER — Other Ambulatory Visit: Payer: Self-pay

## 2024-10-20 ENCOUNTER — Ambulatory Visit

## 2024-10-20 ENCOUNTER — Other Ambulatory Visit (HOSPITAL_COMMUNITY)
Admission: RE | Admit: 2024-10-20 | Discharge: 2024-10-20 | Disposition: A | Discharge status: Home / Self Care | Source: Ambulatory Visit | Attending: Family Medicine | Admitting: Family Medicine

## 2024-10-20 VITALS — BP 144/93 | HR 77 | Ht 67.0 in | Wt 156.0 lb

## 2024-10-20 DIAGNOSIS — N898 Other specified noninflammatory disorders of vagina: Secondary | ICD-10-CM

## 2024-10-20 NOTE — Progress Notes (Signed)
 Angela Manning is here for a self-swab today. Angela Manning states she has been experiencing discharge and odor. I explained how to obtain the self-swab and she verbalized understanding. Explained that we will be in contact with any abnormal results. Patient states she has no further questions or concerns.   Devon, RN 10/20/24

## 2024-10-21 ENCOUNTER — Ambulatory Visit: Payer: Self-pay | Admitting: Certified Nurse Midwife

## 2024-10-21 DIAGNOSIS — A599 Trichomoniasis, unspecified: Secondary | ICD-10-CM

## 2024-10-21 DIAGNOSIS — B9689 Other specified bacterial agents as the cause of diseases classified elsewhere: Secondary | ICD-10-CM

## 2024-10-21 LAB — CERVICOVAGINAL ANCILLARY ONLY
Bacterial Vaginitis (gardnerella): POSITIVE — AB
Candida Glabrata: NEGATIVE
Candida Vaginitis: NEGATIVE
Chlamydia: NEGATIVE
Comment: NEGATIVE
Comment: NEGATIVE
Comment: NEGATIVE
Comment: NEGATIVE
Comment: NEGATIVE
Comment: NORMAL
Neisseria Gonorrhea: NEGATIVE
Trichomonas: POSITIVE — AB

## 2024-10-26 ENCOUNTER — Telehealth: Payer: Self-pay

## 2024-10-26 NOTE — Telephone Encounter (Signed)
 Patient returned RN call.  I advised pt of Dr. Merlynn review, that the recommendation of treatment is desensitization treatment at the hospital due to the Flagyl  allergy and it is most effective medication for treatment of trichomonas.  Pt requested clindamycin , I advised pt that it may treat BV but will not effectively treat trich which is why the provider recommends the desensitization.  Patient stated  she was appreciative of this call but she will go to urgent care on her day off so they will give her clindamycin  for BV she has been through this before. I attempted to reiterate that she would still need treatment for trich, pt ended call.     Waddell, RN

## 2024-10-26 NOTE — Patient Instructions (Signed)
 Angela Manning Lie - I am sorry I was unable to reach you today for our scheduled appointment. I work with Nguyen, Diana, MD and am calling to support your healthcare needs. Please contact me at [phone] at your earliest convenience. I look forward to speaking with you soon.   Thank you,  Hendricks Her RN, BSN  [place] [facility] RN Case Manager   Direct [phone]

## 2024-10-26 NOTE — Telephone Encounter (Addendum)
 Pt left voicemail stating she saw results in MyChart and is requesting someone to reach out to her about treatment.    I reviewed results of BV and Trich with Dr. Lola, along with Flagyl  allergy.  Provider stated that the recommendation is that she go to ER for desensitization due to flagyl  allergy and it is most reliable treatment for Trichomonas.  Waddell, RN

## 2024-10-27 ENCOUNTER — Ambulatory Visit (HOSPITAL_COMMUNITY)

## 2024-10-28 ENCOUNTER — Ambulatory Visit (HOSPITAL_COMMUNITY)
Admission: RE | Admit: 2024-10-28 | Discharge: 2024-10-28 | Disposition: A | Discharge status: Home / Self Care | Source: Ambulatory Visit | Attending: Family Medicine | Admitting: Family Medicine

## 2024-10-28 ENCOUNTER — Encounter (HOSPITAL_COMMUNITY): Payer: Self-pay

## 2024-10-28 VITALS — BP 146/99 | HR 64 | Temp 98.3°F | Resp 16

## 2024-10-28 DIAGNOSIS — R109 Unspecified abdominal pain: Secondary | ICD-10-CM | POA: Diagnosis not present

## 2024-10-28 DIAGNOSIS — N898 Other specified noninflammatory disorders of vagina: Secondary | ICD-10-CM | POA: Insufficient documentation

## 2024-10-28 DIAGNOSIS — Z3202 Encounter for pregnancy test, result negative: Secondary | ICD-10-CM

## 2024-10-28 LAB — POCT URINE DIPSTICK
Bilirubin, UA: NEGATIVE
Glucose, UA: NEGATIVE mg/dL
Ketones, POC UA: NEGATIVE mg/dL
Leukocytes, UA: NEGATIVE
Nitrite, UA: NEGATIVE
POC PROTEIN,UA: NEGATIVE
Spec Grav, UA: 1.015 (ref 1.010–1.025)
Urobilinogen, UA: 0.2 U/dL
pH, UA: 5.5 (ref 5.0–8.0)

## 2024-10-28 LAB — POCT URINE PREGNANCY: Preg Test, Ur: NEGATIVE

## 2024-10-28 MED ORDER — CLINDAMYCIN HCL 300 MG PO CAPS: 300.0000 mg | ORAL_CAPSULE | Freq: Two times a day (BID) | ORAL | 0 refills | Status: AC

## 2024-10-28 NOTE — ED Triage Notes (Signed)
 Patient c/o mid and lower abdominal pain x 3 days. Patient also endorses a white, clumpy vaginal discharge. Patient reports a history of BV and states it usually appears right before her menstrual cycle.

## 2024-10-28 NOTE — Discharge Instructions (Addendum)
 You were seen today for vaginal symptoms and abdominal pain.  I have sent out clindamycin  to treat likely BV.   Your urine otherwise was normal.  The vaginal swab will be resulted tomorrow.  You will see this on mychart and you will be notified if there is anything else abnormal that needs to be treated.

## 2024-10-28 NOTE — ED Provider Notes (Signed)
 [facility]    CSN: [id number] Arrival date & time: 10/28/24  1027      History   Chief Complaint Chief Complaint  Patient presents with   Abdominal Pain    Entered by patient   Vaginal Discharge    HPI Angela Manning is a 38 y.o. female.    Abdominal Pain Associated symptoms: vaginal discharge   Vaginal Discharge Associated symptoms: abdominal pain    Patient is here for abdominal pain and vaginal d/c.  H/o BV and patient thinks this is it again.  No risks for STD.  No n/v.  No urinary symptoms noted.  She is allergic to flagyl  so is usually treated with clindamycin .        Past Medical History:  Diagnosis Date   AKI (acute kidney injury) 03/31/2024   Bacterial vaginosis    Hypertension    Hypertension affecting pregnancy, antepartum 10/27/2019   Ovarian cyst    Preeclampsia 09/10/2023   Pregnancy induced hypertension    Primary hypertension 03/03/2020    Patient Active Problem List   Diagnosis Date Noted   Right-sided Bell's palsy 09/16/2024   Left ankle pain 05/10/2024   Hyperkalemia 03/31/2024   Altered mental status 03/30/2024   Acute pericarditis 02/29/2024   Resistant hypertension 02/27/2024   Chest pain 02/27/2024   Thyrotoxicosis due to Graves' disease 02/27/2024   Weakness of both lower extremities 02/09/2024   Thyroid  enlargement 02/09/2024   Abnormal Pap smear of cervix 10/23/2021   Generalized anxiety disorder 04/19/2021   Primary hypertension 03/03/2020   Ovarian cyst, left 03/01/2015   Menorrhagia with irregular cycle 03/01/2015   Dyspareunia 03/01/2015    Past Surgical History:  Procedure Laterality Date   DILATION AND EVACUATION N/A 02/21/2013   Procedure: DILATATION AND EVACUATION;  Surgeon: Glenys GORMAN Birk, MD;  Location: [facility];  Service: Gynecology;  Laterality: N/A;    OB History     Gravida  6   Para  4   Term  3   Preterm  1   AB      Living  4      SAB      IAB      Ectopic       Multiple      Live Births  4            Home Medications    Prior to Admission medications   Medication Sig Start Date End Date Taking? Authorizing Provider  amLODipine  (NORVASC ) 10 MG tablet Take 1 tablet (10 mg total) by mouth daily. 05/10/24   Atway, Rayann N, DO  atenolol  (TENORMIN ) 25 MG tablet Take 1 tablet (25 mg total) by mouth daily. 05/10/24   Atway, Rayann N, DO  colchicine  0.6 MG tablet Take 1 tablet (0.6 mg total) by mouth 2 (two) times daily. Patient not taking: Reported on 10/20/2024 02/29/24   McLendon, Michael, MD  ibuprofen  (ADVIL ) 600 MG tablet Take 1 tablet (600 mg total) by mouth 3 (three) times daily. Patient not taking: Reported on 10/20/2024 02/29/24   Norrine Sharper, MD  methimazole  (TAPAZOLE ) 10 MG tablet TAKE 2 TABLETS BY MOUTH 2 TIMES DAILY. 06/02/24   Zheng, Michael, DO  spironolactone  (ALDACTONE ) 25 MG tablet Take 1 tablet (25 mg total) by mouth daily. 02/05/24   Jolaine Pac, DO  norethindrone  (MICRONOR ,CAMILA ,ERRIN ) 0.35 MG tablet Take 1 tablet (0.35 mg total) by mouth daily. Patient not taking: Reported on 08/23/2019 10/07/18 09/29/19  Nicholaus Burnard HERO, MD  Family History Family History  Problem Relation Age of Onset   Stroke Mother    Hypertension Mother    Valvular heart disease Mother    Healthy Father    Hypertension Maternal Grandmother    Hypertension Paternal Grandmother     Social History Social History   Tobacco Use   Smoking status: Former    Current packs/day: 0.10    Types: Cigarettes   Smokeless tobacco: Former    Quit date: 01/08/2015   Tobacco comments:    Smokes sometimes.  Vaping Use   Vaping status: Never Used  Substance Use Topics   Alcohol use: Never   Drug use: Not Currently     Allergies   Ace inhibitors and Flagyl  [metronidazole ]   Review of Systems Review of Systems  Constitutional: Negative.   HENT: Negative.    Respiratory: Negative.    Cardiovascular: Negative.   Gastrointestinal:  Positive for  abdominal pain.  Genitourinary:  Positive for vaginal discharge.     Physical Exam Triage Vital Signs ED Triage Vitals  Encounter Vitals Group     BP 10/28/24 1036 (!) 146/99     Girls Systolic BP Percentile --      Girls Diastolic BP Percentile --      Boys Systolic BP Percentile --      Boys Diastolic BP Percentile --      Pulse Rate 10/28/24 1036 64     Resp 10/28/24 1036 16     Temp 10/28/24 1036 98.3 F (36.8 C)     Temp Source 10/28/24 1036 Oral     SpO2 10/28/24 1036 96 %     Weight --      Height --      Head Circumference --      Peak Flow --      Pain Score 10/28/24 1035 5     Pain Loc --      Pain Education --      Exclude from Growth Chart --    No data found.  Updated Vital Signs BP (!) 146/99 (BP Location: Left Arm)   Pulse 64   Temp 98.3 F (36.8 C) (Oral)   Resp 16   LMP 09/29/2024 (Exact Date)   SpO2 96%   Visual Acuity Right Eye Distance:   Left Eye Distance:   Bilateral Distance:    Right Eye Near:   Left Eye Near:    Bilateral Near:     Physical Exam Constitutional:      General: She is not in acute distress.    Appearance: She is well-developed and normal weight. She is not ill-appearing or toxic-appearing.  Cardiovascular:     Rate and Rhythm: Normal rate and regular rhythm.  Pulmonary:     Effort: Pulmonary effort is normal.     Breath sounds: Normal breath sounds.  Abdominal:     General: Bowel sounds are normal.     Palpations: Abdomen is soft.     Tenderness: There is no abdominal tenderness.  Skin:    General: Skin is warm.  Neurological:     General: No focal deficit present.     Mental Status: She is alert.  Psychiatric:        Mood and Affect: Mood normal.      UC Treatments / Results  Labs (all labs ordered are listed, but only abnormal results are displayed) Labs Reviewed  POCT URINE DIPSTICK - Abnormal; Notable for the following components:      Result  Value   Clarity, UA cloudy (*)    Blood, UA large (*)     All other components within normal limits  POCT URINE PREGNANCY  CERVICOVAGINAL ANCILLARY ONLY   UPT negative  EKG   Radiology No results found.  Procedures Procedures (including critical care time)  Medications Ordered in UC Medications - No data to display  Initial Impression / Assessment and Plan / UC Course  I have reviewed the triage vital signs and the nursing notes.  Pertinent labs & imaging results that were available during my care of the patient were reviewed by me and considered in my medical decision making (see chart for details).   Final Clinical Impressions(s) / UC Diagnoses   Final diagnoses:  Abdominal pain, unspecified abdominal location  Vaginal discharge     Discharge Instructions      You were seen today for vaginal symptoms and abdominal pain.  I have sent out clindamycin  to treat likely BV.   Your urine otherwise was normal.  The vaginal swab will be resulted tomorrow.  You will see this on mychart and you will be notified if there is anything else abnormal that needs to be treated.      ED Prescriptions     Medication Sig Dispense Auth. Provider   clindamycin  (CLEOCIN ) 300 MG capsule Take 1 capsule (300 mg total) by mouth in the morning and at bedtime for 7 days. 14 capsule Darral Longs, MD      PDMP not reviewed this encounter.   Darral Longs, MD 10/28/24 [phone]

## 2024-10-29 ENCOUNTER — Ambulatory Visit (HOSPITAL_COMMUNITY): Payer: Self-pay

## 2024-10-29 LAB — CERVICOVAGINAL ANCILLARY ONLY
Bacterial Vaginitis (gardnerella): POSITIVE — AB
Candida Glabrata: NEGATIVE
Candida Vaginitis: NEGATIVE
Chlamydia: NEGATIVE
Comment: NEGATIVE
Comment: NEGATIVE
Comment: NEGATIVE
Comment: NEGATIVE
Comment: NEGATIVE
Comment: NORMAL
Neisseria Gonorrhea: NEGATIVE
Trichomonas: POSITIVE — AB

## 2024-10-29 NOTE — Telephone Encounter (Signed)
 I put in a referral but please inform patient that we do not have the ability to work referrals and so it may also be worthwhile to follow-up with OB/GYN who can ensure appropriate treatment and follow up of this  condition.  She should also try calling [place] [facility] (this is where the referral went).  [facility]  at [facility] [address] [place], [place], [place] [address]  [phone]

## 2024-11-03 ENCOUNTER — Telehealth: Payer: Self-pay

## 2024-11-03 NOTE — Patient Instructions (Signed)
 Clayborne GORMAN Lie - I am sorry I was unable to reach you today for our scheduled appointment. I work with Nguyen, Diana, MD and am calling to support your healthcare needs. Please contact me at [phone] at your earliest convenience. I look forward to speaking with you soon.   Thank you,  Hendricks Her RN, BSN  [place] I VBCI-Population Health RN Case Manager   Direct [phone]

## 2024-11-10 ENCOUNTER — Telehealth: Payer: Self-pay

## 2024-11-10 NOTE — Patient Instructions (Signed)
 Clayborne GORMAN Lie - I am sorry I was unable to reach you today for our scheduled appointment. I work with Nguyen, Diana, MD and am calling to support your healthcare needs. Please contact me at Clayborne GORMAN Lie - I have attempted to call you three times but have been unsuccessful in reaching you. I work with Nguyen, Diana, MD and am calling to support your healthcare needs. If I can be of assistance to you, please contact me at [phone].       Thank you,  Hendricks Her RN, BSN  [facility] RN Case Manager   Direct [phone]

## 2024-11-25 ENCOUNTER — Ambulatory Visit: Admitting: Allergy & Immunology

## 2024-12-15 ENCOUNTER — Encounter: Payer: Self-pay | Admitting: Cardiovascular Disease

## 2025-01-10 ENCOUNTER — Ambulatory Visit: Admitting: Cardiovascular Disease

## 2025-02-08 ENCOUNTER — Ambulatory Visit: Admitting: Cardiovascular Disease
